# Patient Record
Sex: Female | Born: 1975 | Race: White | Hispanic: No | Marital: Single | Smoking: Former smoker
Health system: Southern US, Community
[De-identification: ages and names within clinical notes are randomized; demographics above are authoritative.]

## PROBLEM LIST (undated history)

## (undated) DIAGNOSIS — K219 Gastro-esophageal reflux disease without esophagitis: Secondary | ICD-10-CM

## (undated) DIAGNOSIS — F32A Depression, unspecified: Secondary | ICD-10-CM

## (undated) DIAGNOSIS — E119 Type 2 diabetes mellitus without complications: Secondary | ICD-10-CM

## (undated) DIAGNOSIS — F419 Anxiety disorder, unspecified: Secondary | ICD-10-CM

## (undated) DIAGNOSIS — G709 Myoneural disorder, unspecified: Secondary | ICD-10-CM

## (undated) DIAGNOSIS — E039 Hypothyroidism, unspecified: Secondary | ICD-10-CM

## (undated) DIAGNOSIS — I10 Essential (primary) hypertension: Secondary | ICD-10-CM

## (undated) DIAGNOSIS — K317 Polyp of stomach and duodenum: Secondary | ICD-10-CM

## (undated) DIAGNOSIS — J45909 Unspecified asthma, uncomplicated: Secondary | ICD-10-CM

## (undated) DIAGNOSIS — F329 Major depressive disorder, single episode, unspecified: Secondary | ICD-10-CM

## (undated) DIAGNOSIS — L0390 Cellulitis, unspecified: Secondary | ICD-10-CM

## (undated) DIAGNOSIS — E669 Obesity, unspecified: Secondary | ICD-10-CM

## (undated) DIAGNOSIS — M1990 Unspecified osteoarthritis, unspecified site: Secondary | ICD-10-CM

## (undated) DIAGNOSIS — A419 Sepsis, unspecified organism: Secondary | ICD-10-CM

## (undated) DIAGNOSIS — G4730 Sleep apnea, unspecified: Secondary | ICD-10-CM

## (undated) DIAGNOSIS — N289 Disorder of kidney and ureter, unspecified: Secondary | ICD-10-CM

## (undated) DIAGNOSIS — K2970 Gastritis, unspecified, without bleeding: Secondary | ICD-10-CM

## (undated) DIAGNOSIS — E785 Hyperlipidemia, unspecified: Secondary | ICD-10-CM

## (undated) HISTORY — DX: Unspecified osteoarthritis, unspecified site: M19.90

## (undated) HISTORY — DX: Polyp of stomach and duodenum: K31.7

## (undated) HISTORY — PX: VAGINAL HYSTERECTOMY: SUR661

## (undated) HISTORY — PX: OTHER SURGICAL HISTORY: SHX169

## (undated) HISTORY — DX: Gastritis, unspecified, without bleeding: K29.70

## (undated) HISTORY — PX: TONSILLECTOMY: SUR1361

## (undated) HISTORY — PX: CHOLECYSTECTOMY: SHX55

## (undated) HISTORY — DX: Gastro-esophageal reflux disease without esophagitis: K21.9

## (undated) HISTORY — DX: Obesity, unspecified: E66.9

## (undated) HISTORY — PX: ADENOIDECTOMY: SUR15

## (undated) HISTORY — PX: URETERAL STENT PLACEMENT: SHX822

## (undated) HISTORY — PX: ANKLE SURGERY: SHX546

## (undated) HISTORY — DX: Myoneural disorder, unspecified: G70.9

## (undated) HISTORY — DX: Sepsis, unspecified organism: A41.9

## (undated) HISTORY — DX: Cellulitis, unspecified: L03.90

## (undated) HISTORY — DX: Hypothyroidism, unspecified: E03.9

## (undated) HISTORY — PX: PICC LINE INSERTION: CATH118290

## (undated) HISTORY — DX: Sleep apnea, unspecified: G47.30

## (undated) HISTORY — PX: FOOT SURGERY: SHX648

---

## 1999-03-06 ENCOUNTER — Encounter: Payer: Self-pay | Admitting: Emergency Medicine

## 1999-03-06 ENCOUNTER — Emergency Department (HOSPITAL_COMMUNITY): Admission: EM | Admit: 1999-03-06 | Discharge: 1999-03-06 | Payer: Self-pay | Admitting: Emergency Medicine

## 1999-07-13 ENCOUNTER — Emergency Department (HOSPITAL_COMMUNITY): Admission: EM | Admit: 1999-07-13 | Discharge: 1999-07-13 | Payer: Self-pay | Admitting: Emergency Medicine

## 1999-07-13 ENCOUNTER — Encounter: Payer: Self-pay | Admitting: Emergency Medicine

## 2002-02-11 HISTORY — PX: COLONOSCOPY: SHX174

## 2003-07-24 ENCOUNTER — Encounter: Payer: Self-pay | Admitting: Emergency Medicine

## 2003-07-24 ENCOUNTER — Emergency Department (HOSPITAL_COMMUNITY): Admission: EM | Admit: 2003-07-24 | Discharge: 2003-07-24 | Payer: Self-pay | Admitting: Emergency Medicine

## 2004-10-15 ENCOUNTER — Inpatient Hospital Stay (HOSPITAL_COMMUNITY): Admission: AD | Admit: 2004-10-15 | Discharge: 2004-10-15 | Payer: Self-pay | Admitting: Family Medicine

## 2007-09-09 ENCOUNTER — Encounter: Payer: Self-pay | Admitting: Infectious Diseases

## 2007-09-10 ENCOUNTER — Other Ambulatory Visit: Admission: RE | Admit: 2007-09-10 | Discharge: 2007-09-10 | Payer: Self-pay | Admitting: *Deleted

## 2007-09-10 ENCOUNTER — Encounter (INDEPENDENT_AMBULATORY_CARE_PROVIDER_SITE_OTHER): Payer: Self-pay | Admitting: *Deleted

## 2008-01-11 ENCOUNTER — Emergency Department (HOSPITAL_COMMUNITY): Admission: EM | Admit: 2008-01-11 | Discharge: 2008-01-11 | Payer: Self-pay | Admitting: Emergency Medicine

## 2008-04-14 ENCOUNTER — Inpatient Hospital Stay (HOSPITAL_COMMUNITY): Admission: RE | Admit: 2008-04-14 | Discharge: 2008-04-16 | Payer: Self-pay | Admitting: Radiology

## 2008-04-14 ENCOUNTER — Ambulatory Visit: Payer: Self-pay | Admitting: *Deleted

## 2008-06-14 ENCOUNTER — Ambulatory Visit: Payer: Self-pay | Admitting: Infectious Diseases

## 2008-06-14 DIAGNOSIS — E8941 Symptomatic postprocedural ovarian failure: Secondary | ICD-10-CM

## 2008-06-14 DIAGNOSIS — G43109 Migraine with aura, not intractable, without status migrainosus: Secondary | ICD-10-CM | POA: Insufficient documentation

## 2008-06-14 DIAGNOSIS — J45909 Unspecified asthma, uncomplicated: Secondary | ICD-10-CM | POA: Insufficient documentation

## 2008-06-14 DIAGNOSIS — Z9049 Acquired absence of other specified parts of digestive tract: Secondary | ICD-10-CM | POA: Insufficient documentation

## 2008-06-14 DIAGNOSIS — R599 Enlarged lymph nodes, unspecified: Secondary | ICD-10-CM | POA: Insufficient documentation

## 2008-06-14 DIAGNOSIS — E0590 Thyrotoxicosis, unspecified without thyrotoxic crisis or storm: Secondary | ICD-10-CM | POA: Insufficient documentation

## 2008-06-14 DIAGNOSIS — F411 Generalized anxiety disorder: Secondary | ICD-10-CM | POA: Insufficient documentation

## 2008-06-14 LAB — CONVERTED CEMR LAB
Angiotensin 1 Converting Enzyme: 28 units/L (ref 9–67)
Hep B Core Total Ab: NEGATIVE
Hepatitis B Surface Ag: NEGATIVE
IgM, Serum: 288 mg/dL — ABNORMAL HIGH (ref 60–263)
Neutrophil cytoplasmic antibodies,IgG,serum: <1:20 {titer}

## 2008-06-17 ENCOUNTER — Encounter: Payer: Self-pay | Admitting: Infectious Diseases

## 2008-06-21 ENCOUNTER — Encounter: Payer: Self-pay | Admitting: Infectious Diseases

## 2008-06-22 ENCOUNTER — Encounter: Payer: Self-pay | Admitting: Infectious Diseases

## 2008-07-15 ENCOUNTER — Ambulatory Visit: Payer: Self-pay | Admitting: Infectious Diseases

## 2008-07-15 DIAGNOSIS — IMO0002 Reserved for concepts with insufficient information to code with codable children: Secondary | ICD-10-CM

## 2008-07-16 ENCOUNTER — Ambulatory Visit (HOSPITAL_COMMUNITY): Admission: RE | Admit: 2008-07-16 | Discharge: 2008-07-16 | Payer: Self-pay | Admitting: Infectious Diseases

## 2008-07-21 ENCOUNTER — Telehealth: Payer: Self-pay | Admitting: Infectious Diseases

## 2009-12-04 ENCOUNTER — Inpatient Hospital Stay (HOSPITAL_COMMUNITY): Admission: EM | Admit: 2009-12-04 | Discharge: 2009-12-05 | Payer: Self-pay | Admitting: Emergency Medicine

## 2009-12-04 ENCOUNTER — Ambulatory Visit: Payer: Self-pay | Admitting: Pulmonary Disease

## 2011-02-04 LAB — D-DIMER, QUANTITATIVE: D-Dimer, Quant: 0.33 ug/mL-FEU (ref 0.00–0.48)

## 2011-02-04 LAB — POCT I-STAT 3, ART BLOOD GAS (G3+)
Acid-Base Excess: 1 mmol/L (ref 0.0–2.0)
Acid-Base Excess: 3 mmol/L — ABNORMAL HIGH (ref 0.0–2.0)
O2 Saturation: 95 %
O2 Saturation: 87 %
TCO2: 34 mmol/L (ref 0–100)
TCO2: 30 mmol/L (ref 0–100)

## 2011-02-04 LAB — COMPREHENSIVE METABOLIC PANEL
ALT: 13 U/L (ref 0–35)
AST: 26 U/L (ref 0–37)
CO2: 26 mEq/L (ref 19–32)
Calcium: 9.1 mg/dL (ref 8.4–10.5)
Calcium: 9.1 mg/dL (ref 8.4–10.5)
Creatinine, Ser: 1.83 mg/dL — ABNORMAL HIGH (ref 0.4–1.2)
GFR calc Af Amer: 38 mL/min — ABNORMAL LOW (ref >60)
GFR calc non Af Amer: 31 mL/min — ABNORMAL LOW (ref >60)
Glucose, Bld: 98 mg/dL (ref 70–99)
Sodium: 137 mEq/L (ref 135–145)
Sodium: 143 mEq/L (ref 135–145)
Total Protein: 8.1 g/dL (ref 6.0–8.3)
Total Protein: 6.5 g/dL (ref 6.0–8.3)

## 2011-02-04 LAB — RAPID URINE DRUG SCREEN, HOSP PERFORMED
Amphetamines: NOT DETECTED
Barbiturates: NOT DETECTED
Benzodiazepines: POSITIVE — AB
Opiates: NOT DETECTED
Tetrahydrocannabinol: NOT DETECTED

## 2011-02-04 LAB — GLUCOSE, CAPILLARY
Glucose-Capillary: 151 mg/dL — ABNORMAL HIGH (ref 70–99)
Glucose-Capillary: 107 mg/dL — ABNORMAL HIGH (ref 70–99)
Glucose-Capillary: 141 mg/dL — ABNORMAL HIGH (ref 70–99)
Glucose-Capillary: 78 mg/dL (ref 70–99)

## 2011-02-04 LAB — CARDIAC PANEL(CRET KIN+CKTOT+MB+TROPI)
CK, MB: 0.8 ng/mL (ref 0.3–4.0)
Relative Index: INVALID (ref 0.0–2.5)
Relative Index: INVALID (ref 0.0–2.5)
Total CK: 55 U/L (ref 7–177)
Total CK: 55 U/L (ref 7–177)
Troponin I: 0.02 ng/mL (ref 0.00–0.06)
Troponin I: 0.02 ng/mL (ref 0.00–0.06)
Troponin I: 0.03 ng/mL (ref 0.00–0.06)

## 2011-02-04 LAB — CBC
Hemoglobin: 12.7 g/dL (ref 12.0–15.0)
MCHC: 33.9 g/dL (ref 30.0–36.0)
MCHC: 34.3 g/dL (ref 30.0–36.0)
MCV: 91.9 fL (ref 78.0–100.0)
Platelets: 218 10*3/uL (ref 150–400)
RBC: 4.88 MIL/uL (ref 3.87–5.11)
RDW: 15.6 % — ABNORMAL HIGH (ref 11.5–15.5)
RDW: 15.8 % — ABNORMAL HIGH (ref 11.5–15.5)

## 2011-02-04 LAB — URINALYSIS, ROUTINE W REFLEX MICROSCOPIC
Bilirubin Urine: NEGATIVE
Hgb urine dipstick: NEGATIVE
Nitrite: NEGATIVE
Protein, ur: NEGATIVE mg/dL
Urobilinogen, UA: 0.2 mg/dL (ref 0.0–1.0)

## 2011-02-04 LAB — DIFFERENTIAL
Eosinophils Relative: 2 % (ref 0–5)
Lymphocytes Relative: 18 % (ref 12–46)
Lymphs Abs: 1.9 10*3/uL (ref 0.7–4.0)
Monocytes Relative: 1 % — ABNORMAL LOW (ref 3–12)

## 2011-02-04 LAB — MAGNESIUM: Magnesium: 2.2 mg/dL (ref 1.5–2.5)

## 2011-04-03 NOTE — H&P (Signed)
Lynn Ponce, Lynn Ponce NO.:  [id number]   MEDICAL RECORD NO.:  [id number]          PATIENT TYPE:  IPS   LOCATION:  0304                          FACILITY:  [facility]   PHYSICIAN:  Jasmine Pang, M.D. DATE OF BIRTH:  July 22, 1976   DATE OF ADMISSION:  04/14/2008  DATE OF DISCHARGE:  04/16/2008                       PSYCHIATRIC ADMISSION ASSESSMENT   This is a 35 year old female voluntarily admitted on Apr 14, 2008.   HISTORY OF PRESENT ILLNESS:  The patient presents with having  hallucinations, experiencing derogatory-type voices, command to hurt  herself.  She states that she had called the crisis center because she  felt that she was wanting to cut herself.  She states her husband  stopped her from any kind of self-harm.  She reports experiencing visual  hallucinations, seeing bad things, feeling depressed and overwhelmed  with recent surgery, taking care of children and being laid off from her  job.  She reports sleeping well with her Ambien CR.  Her appetite has  been satisfactory.  Reporting also problems with energy and feeling  anxious.   PAST PSYCHIATRIC HISTORY:  First admission to [facility]  Sees Dr. Claudie Revering at [facility].  She reports she has been  treated for PTSD, OCD and major depression.   SOCIAL HISTORY:  She is a 35 year old married female.  She lives in  [place], has two children ages 80 and 61.  She is laid off from work  several months ago.  Reports her husband is supportive but does not  understand her mental illness.   FAMILY HISTORY:  Grandmother with bipolar disorder, son who has ADD and  possible bipolar disorder.   ALCOHOL AND DRUG HISTORY:  The patient smokes.  Denies any alcohol or  drug use.   PRIMARY CARE Briah Nary:  Dr. Bevely Palmer.   MEDICAL PROBLEMS:  Had carpal tunnel surgery on her right foot  approximately 4-6 weeks ago.  Currently has a urinary tract infection.  History of migraine headaches and  hypothyroidism.   MEDICATIONS:  1. Abilify 15 mg t.i.d.  2. Valium 2 mg q.i.d.  3. Clomipramine 150 mg at bedtime.  4. Valium 10 mg t.i.d. with a p.r.n. 10 mg dose.  5. Ambien CR for sleep.  6. Synthroid 25 mg daily.  7. Singulair 10 mg at bedtime  8. Has p.r.n. medications Phenergan, Imitrex and Vicodin.  9. Currently on Keflex 500 mg for treating her urinary tract      infection.   DRUG ALLERGIES:  1. SULFA.  2. E-MYCIN.  3. ZITHROMAX.   PHYSICAL EXAMINATION:  The patient was fully assessed at [facility].  She received Valium 10 mg and Percocet two.  Her temperature  is 97.4, 109 heart rate, 18 respirations, blood pressure is 105/76, 5  feet 6 inches tall, 306 pounds.   LABORATORY DATA:  CBC within normal limits.  Alcohol level less than  0.01.  Glucose of 117.  Urine drug screen is positive for  benzodiazepines, positive for opiates, positive for tricyclics.  Urine  pregnancy test is negative.  Urinalysis shows trace leukocytes.  MENTAL STATUS EXAM:  She is fully alert, cooperative, good eye contact.  Currently resting in bed.  Her speech is clear.  Mood is depressed and  anxious.  The patient's affect is pleasant, cooperative.  Thought  processes are coherent.  No evidence of any psychotic symptoms.  Endorsing auditory hallucinations and past visual hallucinations.  Does  not appear to be actively responding.  Cognitive function intact.  Memory is good.  Judgment and insight are fair.   AXIS I:  Major depressive disorder severe, post traumatic stress  disorder and obsessive-compulsive disorder per history.  AXIS II:  Deferred.  AXIS III:  Migraines, asthma, hypothyroidism, status post carpal tunnel  of the right foot, urinary tract infection.  AXIS IV:  Problems with economic issues, psychosocial problems, medical  problems, possible problems with primary support group with lack of  understanding in regards to the patient's mental illness.  AXIS V:  Current  is 30.   PLANS:  Contract for safety.  Stabilize mood.  Will discontinue Abilify,  initiate Geodon.  Risks and benefits were discussed.  The patient is  agreeable to initiating new medication.  We will contact the husband for  support concerns.  The patient considered a fall risk.  Will clarify  medications.  The patient is to continue to be medication compliant and  follow up with her appointments.   Tentative length of stay is 3-5 days.      Landry Corporal, N.P.      Jasmine Pang, M.D.  Electronically Signed    JO/MEDQ  D:  04/16/2008  T:  04/16/2008  Job:  [id number]

## 2011-04-03 NOTE — Discharge Summary (Signed)
NAMESONTEE, DESENA NO.:  [id number]   MEDICAL RECORD NO.:  [id number]          PATIENT TYPE:  IPS   LOCATION:  0304                          FACILITY:  [facility]   PHYSICIAN:  Jasmine Pang, M.D. DATE OF BIRTH:  1976/05/30   DATE OF ADMISSION:  04/14/2008  DATE OF DISCHARGE:  04/16/2008                               DISCHARGE SUMMARY   IDENTIFYING INFORMATION:  This is a 35 year old married white female  from [place], [place], who was admitted on a voluntary basis on  Apr 14, 2008.   HISTORY OF PRESENT ILLNESS:  The patient has a history of hallucination.  She has been experiencing voices, which were derogatory.  She called the  [facility]ris[facility].  She felt she was ready to cut herself.  Her husband  restrained her and kept her from doing this.  She was having positive  visual hallucinations bad things.  She was feeling depressed and  overwhelmed.  She is having decreased energy and very anxious.   PAST PSYCHIATRIC HISTORY:  The patient goes to [facility].  She is on Abilify 15 mg p.o. t.i.d. and Valium 2 mg p.o. q.i.d.  She sees Dr. Penni Bombard. He is treated for PTSD, OCD, major depression, and  ? bipolar disorder.   FAMILY HISTORY:  Grandmother has bipolar disorder.  Son has ADHD and  possibly bipolar disorder.   ALCOHOL AND DRUG HISTORY:  The patient smokes.  She denies any use of  alcohol  and drugs.   MEDICAL PROBLEMS:  1. Carpal tunnel syndrome in her right foot.  2. UTI.  3. Migraine headaches.   MEDICATIONS:  1. Abilify 15 mg p.o. t.i.d.  2. Valium 2 mg p.o. q.i.d.  3. Clomipramine.  4. Ambien CR.   DRUG ALLERGIES:  1. E-Mycin.  2. Ibuprofen.  3. Sulfa drugs.  4. Zithromax.   PHYSICAL FINDINGS:  There are no acute physical or medical problems.  Physical exam was done in the [facility] and reviewed  here.   DIAGNOSTIC STUDIES:  CBC was within normal limits.  Alcohol level was  less than 0.1.  Glucose  117.  UDS positive for benzodiazepines, opiates,  and tricyclics.  Urine pregnancy test was negative.  Urinalysis revealed  trace leukocytes.   HOSPITAL COURSE:  Upon admission, the patient was restarted on her  Abilify 15 mg p.o. t.i.d., clonazepam 0.5 mg p.o. t.i.d., clomipramine  150 mg p.o. q.h.s., Singulair 10 mg p.o. q.h.s., furosemide 60 mg p.o.  q.day, Valium 10 mg p.o. q.day, and Phenergan 25 mg p.o. q.6 h. p.r.n.  nausea.  She was also continued on cephalexin 500 mg p.o. q.i.d. x5  days. The patient was also restarted on her Cymbalta 30 mg p.o. q.day,  Ambien CR 12.5 mg p.o. q.h.s., Synthroid 25 mcg daily, Percocet 5/325  one p.o. q.6 h. p.r.n. pain (from recent surgery on her right ankle),  and furosemide 60 mg daily p.r.n. swelling.  The Cymbalta was  discontinued.  Klonopin was discontinued. Abilify was discontinued.  She  stated she was started on Geodon 40 mg  p.o. every 5 p.m. with food.  The  patient tolerated these medications well with no significant side  effects.  In individual sessions with me, the patient was friendly and  cooperative.  She also participated appropriately in unit therapeutic  groups and activities.  Therapeutic issues revolved around her hearing  voices telling her to kill herself.  She denied any drug or alcohol  abuse.  She states one stressor is that she has been laid off from work.  On Apr 16, 2008, mental status had improved markedly from admission  status.  Her sleep was good and appetite was good.  Mood was euthymic.  Affect wide range.  There was no suicidal or homicidal ideation.  No  thoughts of self-injurious behavior.  No auditory or visual  hallucinations.  No paranoia or delusions.  Thoughts were logical and  goal-directed.  Thought content, excited to see her children when she  gets home and husband.  Cognitive was grossly intact.  It was felt, the  patient was safe for discharge today.  She wanted to go home and she had  a family  member who would pick her up.  She stated her husband wanted  her home as well.   DISCHARGE DIAGNOSES:  Axis I:  Mood disorder not otherwise specified.  Posttraumatic stress disorder and obsessive-compulsive disorder.  Axis II:  None.  Axis III:  Migraines, asthma, hypothyroidism, status post carpal tunnel  surgery of right foot.  Axis IV:  Moderate (economic problems, other psychosocial problems,  medical problems, and burden of psychiatric illness).  Axis V:  Global assessment of functioning was 50 on discharge.  GAF was  30 upon admission.  GAF highest past year was 65.   POSTHOSPITAL CARE PLANS:  There was no specific activity level or  dietary restrictions.   The patient is to go to [facility] in [place] on June 1st at 9:00 a.m.  She is to go to [facility] for counseling.   DISCHARGE MEDICATIONS:  1. Geodon 80 mg at supper.  2. Clomipramine 150 mg at supper.  3. Singulair 10 mg at bedtime.  4. Furosemide 60 mg daily as needed.  5. Synthroid 25 mcg daily.  6. Valium 10 mg three times a day.  7. Resume Phenergan, Imitrex pain pills, and Ambien CR as per district      by her doctor.  8. Keflex 500 mg four times a day until finished on April 19, 2008.      Jasmine Pang, M.D.  Electronically Signed    BHS/MEDQ  D:  04/16/2008  T:  04/16/2008  Job:  [id number]

## 2013-04-08 HISTORY — PX: ESOPHAGOGASTRODUODENOSCOPY: SHX1529

## 2013-12-13 ENCOUNTER — Emergency Department (HOSPITAL_COMMUNITY): Payer: Medicare HMO

## 2013-12-13 ENCOUNTER — Encounter (HOSPITAL_COMMUNITY): Payer: Self-pay | Admitting: Emergency Medicine

## 2013-12-13 ENCOUNTER — Emergency Department (HOSPITAL_COMMUNITY)
Admission: EM | Admit: 2013-12-13 | Discharge: 2013-12-13 | Disposition: A | Discharge status: Home / Self Care | Payer: Medicare HMO | Attending: Emergency Medicine | Admitting: Emergency Medicine

## 2013-12-13 DIAGNOSIS — J45909 Unspecified asthma, uncomplicated: Secondary | ICD-10-CM | POA: Insufficient documentation

## 2013-12-13 DIAGNOSIS — E119 Type 2 diabetes mellitus without complications: Secondary | ICD-10-CM | POA: Insufficient documentation

## 2013-12-13 DIAGNOSIS — Y9389 Activity, other specified: Secondary | ICD-10-CM | POA: Insufficient documentation

## 2013-12-13 DIAGNOSIS — W01119A Fall on same level from slipping, tripping and stumbling with subsequent striking against unspecified sharp object, initial encounter: Secondary | ICD-10-CM | POA: Insufficient documentation

## 2013-12-13 DIAGNOSIS — S81009A Unspecified open wound, unspecified knee, initial encounter: Secondary | ICD-10-CM | POA: Insufficient documentation

## 2013-12-13 DIAGNOSIS — W261XXA Contact with sword or dagger, initial encounter: Secondary | ICD-10-CM

## 2013-12-13 DIAGNOSIS — Z8659 Personal history of other mental and behavioral disorders: Secondary | ICD-10-CM | POA: Insufficient documentation

## 2013-12-13 DIAGNOSIS — Y92009 Unspecified place in unspecified non-institutional (private) residence as the place of occurrence of the external cause: Secondary | ICD-10-CM | POA: Insufficient documentation

## 2013-12-13 DIAGNOSIS — S91009A Unspecified open wound, unspecified ankle, initial encounter: Principal | ICD-10-CM

## 2013-12-13 DIAGNOSIS — S81809A Unspecified open wound, unspecified lower leg, initial encounter: Principal | ICD-10-CM

## 2013-12-13 DIAGNOSIS — S81022A Laceration with foreign body, left knee, initial encounter: Secondary | ICD-10-CM

## 2013-12-13 DIAGNOSIS — W260XXA Contact with knife, initial encounter: Secondary | ICD-10-CM | POA: Insufficient documentation

## 2013-12-13 DIAGNOSIS — I10 Essential (primary) hypertension: Secondary | ICD-10-CM | POA: Insufficient documentation

## 2013-12-13 DIAGNOSIS — Z23 Encounter for immunization: Secondary | ICD-10-CM | POA: Insufficient documentation

## 2013-12-13 HISTORY — DX: Essential (primary) hypertension: I10

## 2013-12-13 HISTORY — DX: Major depressive disorder, single episode, unspecified: F32.9

## 2013-12-13 HISTORY — DX: Type 2 diabetes mellitus without complications: E11.9

## 2013-12-13 HISTORY — DX: Depression, unspecified: F32.A

## 2013-12-13 HISTORY — DX: Hyperlipidemia, unspecified: E78.5

## 2013-12-13 HISTORY — DX: Unspecified asthma, uncomplicated: J45.909

## 2013-12-13 LAB — BASIC METABOLIC PANEL
BUN: 19 mg/dL (ref 6–23)
CHLORIDE: 98 meq/L (ref 96–112)
CO2: 23 meq/L (ref 19–32)
Calcium: 9.7 mg/dL (ref 8.4–10.5)
Creatinine, Ser: 1.39 mg/dL — ABNORMAL HIGH (ref 0.50–1.10)
GFR calc Af Amer: 54 mL/min — ABNORMAL LOW (ref >90)
GFR calc non Af Amer: 47 mL/min — ABNORMAL LOW (ref >90)
GLUCOSE: 147 mg/dL — AB (ref 70–99)
POTASSIUM: 5 meq/L (ref 3.7–5.3)
SODIUM: 138 meq/L (ref 137–147)

## 2013-12-13 LAB — CBC WITH DIFFERENTIAL/PLATELET
BASOS ABS: 0 10*3/uL (ref 0.0–0.1)
Basophils Relative: 0 % (ref 0–1)
Eosinophils Absolute: 0.3 10*3/uL (ref 0.0–0.7)
Eosinophils Relative: 3 % (ref 0–5)
HCT: 40.9 % (ref 36.0–46.0)
Hemoglobin: 14 g/dL (ref 12.0–15.0)
LYMPHS ABS: 4.1 10*3/uL — AB (ref 0.7–4.0)
LYMPHS PCT: 38 % (ref 12–46)
MCH: 29.6 pg (ref 26.0–34.0)
MCHC: 34.2 g/dL (ref 30.0–36.0)
MCV: 86.5 fL (ref 78.0–100.0)
Monocytes Absolute: 0.6 10*3/uL (ref 0.1–1.0)
Monocytes Relative: 6 % (ref 3–12)
NEUTROS ABS: 5.8 10*3/uL (ref 1.7–7.7)
NEUTROS PCT: 54 % (ref 43–77)
PLATELETS: 252 10*3/uL (ref 150–400)
RBC: 4.73 MIL/uL (ref 3.87–5.11)
RDW: 15.6 % — AB (ref 11.5–15.5)
WBC: 10.8 10*3/uL — AB (ref 4.0–10.5)

## 2013-12-13 MED ORDER — CEPHALEXIN 500 MG PO CAPS: 500.0000 mg | ORAL_CAPSULE | Freq: Four times a day (QID) | ORAL | Status: DC

## 2013-12-13 MED ORDER — MORPHINE SULFATE 4 MG/ML IJ SOLN
4.0000 mg | Freq: Once | INTRAMUSCULAR | Status: AC
  Administered 2013-12-13: 4 mg via INTRAVENOUS
  Filled 2013-12-13: qty 1

## 2013-12-13 MED ORDER — SODIUM CHLORIDE 0.9 % IV SOLN
Freq: Once | INTRAVENOUS | Status: AC
  Administered 2013-12-13: 01:00:00 via INTRAVENOUS

## 2013-12-13 MED ORDER — TETANUS-DIPHTH-ACELL PERTUSSIS 5-2.5-18.5 LF-MCG/0.5 IM SUSP
0.5000 mL | Freq: Once | INTRAMUSCULAR | Status: AC
  Administered 2013-12-13: 0.5 mL via INTRAMUSCULAR
  Filled 2013-12-13: qty 0.5

## 2013-12-13 MED ORDER — HYDROCODONE-ACETAMINOPHEN 5-325 MG PO TABS: 2.0000 | ORAL_TABLET | ORAL | Status: DC | PRN

## 2013-12-13 MED ORDER — ONDANSETRON HCL 4 MG/2ML IJ SOLN
4.0000 mg | Freq: Once | INTRAMUSCULAR | Status: AC
  Administered 2013-12-13: 4 mg via INTRAVENOUS
  Filled 2013-12-13: qty 2

## 2013-12-13 MED ORDER — CEFAZOLIN SODIUM 1-5 GM-% IV SOLN
1.0000 g | Freq: Once | INTRAVENOUS | Status: AC
  Administered 2013-12-13: 1 g via INTRAVENOUS
  Filled 2013-12-13: qty 50

## 2013-12-13 NOTE — ED Notes (Signed)
Patient transported to X-ray 

## 2013-12-13 NOTE — ED Notes (Signed)
Pt taken to [facility] in wheel chair. Pt was able to stand and pivot bearing weight on non affected leg. Pt denies feeling drowsy or lightheaded from the Morphine.

## 2013-12-13 NOTE — ED Notes (Signed)
Pt back from xray at this time.

## 2013-12-13 NOTE — ED Provider Notes (Signed)
CSN: [id number]     Arrival date & time 12/13/13  0016 History   First MD Initiated Contact with Patient 12/13/13 0023     Chief Complaint  Patient presents with  . Puncture Wound   (Consider location/radiation/quality/duration/timing/severity/associated sxs/prior Treatment) HPI Comments: Patient is a 38 year old female with history of morbid obesity, hypertension, diabetes. She is transported to the ER by EMS after slipping and falling at home while doing the dishes. She apparently landed on a knife and was brought here with a large kitchen knife embedded in her left knee. She reports pain with any movement it is relieved with sitting still. She denies any other injury in the fall  The history is provided by the patient.    Past Medical History  Diagnosis Date  . Hypertension   . Diabetes mellitus without complication   . Hyperlipemia   . Asthma   . Depression    History reviewed. No pertinent past surgical history. No family history on file. History  Substance Use Topics  . Smoking status: Not on file  . Smokeless tobacco: Not on file  . Alcohol Use: Not on file   OB History   Grav Para Term Preterm Abortions TAB SAB Ect Mult Living                 Review of Systems  All other systems reviewed and are negative.    Allergies  Ibuprofen; Sulfonamide derivatives; and Toradol  Home Medications  No current outpatient prescriptions on file. BP 106/54  Pulse 98  Temp(Src) 98.3 F (36.8 C) (Oral)  Resp 20  SpO2 93% Physical Exam  Nursing note and vitals reviewed. Constitutional: She is oriented to person, place, and time. She appears well-developed and well-nourished. No distress.  HENT:  Head: Normocephalic and atraumatic.  Mouth/Throat: Oropharynx is clear and moist.  Neck: Normal range of motion. Neck supple.  Abdominal: Soft. Bowel sounds are normal.  Musculoskeletal: Normal range of motion. She exhibits no edema.  There is a kitchen knife embedded several  inches into the fatty tissue of the lateral aspect of the left knee. The distal dorsalis pedis and posterior tibial pulses are easily palpable. Sensation and motor are intact to the left foot as well.  Neurological: She is alert and oriented to person, place, and time.  Skin: Skin is warm and dry. She is not diaphoretic.    ED Course  Procedures (including critical care time) Labs Review Labs Reviewed  CBC WITH DIFFERENTIAL  BASIC METABOLIC PANEL   Imaging Review No results found.  LACERATION REPAIR Performed by: Veryl Speak Authorized by: Veryl Speak Consent: Verbal consent obtained. Risks and benefits: risks, benefits and alternatives were discussed Consent given by: patient Patient identity confirmed: provided demographic data Prepped and Draped in normal sterile fashion Wound explored  Laceration Location: Left leg  Laceration Length: 5 cm cm  No Foreign Bodies seen or palpated  Anesthesia: local infiltration  Local anesthetic: lidocaine 2 % without epinephrine  Anesthetic total: 5 ml  Irrigation method: syringe Amount of cleaning: standard  Skin closure: 4-0 Prolene   Number of sutures: 7   Technique: Simple   Patient tolerance: Patient tolerated the procedure well with no immediate complications.   MDM  No diagnosis found. Patient was brought here by EMS after slipping and falling in the kitchen and landing on a knife. She had a kitchen knife embedded in the soft tissues of the lateral aspect of the left knee. X-rays do not indicate penetration of  the knee joint or bony abnormality. I removed the knife and explored the wound. I saw nothing that made me believe there was a traumatic penetration of the knee joint. The wound was heavily irrigated and closed as per the above procedure note. She tolerated this procedure well. Her tetanus was updated she was given IV Kefzol. She will be discharged with local wound care, pain medication, Keflex, and suture  removal in 10-14 days.    Veryl Speak, MD 12/13/13 [phone]

## 2013-12-13 NOTE — ED Notes (Signed)
Per EMS: Pt report she was doing dishes at home alone when she slipped on some water. Pt was holding a large steak knife that she claimed accidentally stabbed her in the leg during the fall. Knife is still in place in her L thigh. Bleeding controlled at this time. Knife is supported by guaze and brace. V/S 130/pal, 94 BPM, Pain 10/10.

## 2013-12-13 NOTE — Discharge Instructions (Signed)
Keflex as prescribed. Hydrocodone as prescribed as needed for pain.  Local wound care: Apply bacitracin and a clean dressing twice daily.  Return to the emergency department if you notice redness surrounding the wound, pus from the wound, streaks up and down the leg, or other concerning findings.  Sutures to be removed in the next 10-14 days.   Laceration Care, Adult A laceration is a cut or lesion that goes through all layers of the skin and into the tissue just beneath the skin. TREATMENT  Some lacerations may not require closure. Some lacerations may not be able to be closed due to an increased risk of infection. It is important to see your caregiver as soon as possible after an injury to minimize the risk of infection and maximize the opportunity for successful closure. If closure is appropriate, pain medicines may be given, if needed. The wound will be cleaned to help prevent infection. Your caregiver will use stitches (sutures), staples, wound glue (adhesive), or skin adhesive strips to repair the laceration. These tools bring the skin edges together to allow for faster healing and a better cosmetic outcome. However, all wounds will heal with a scar. Once the wound has healed, scarring can be minimized by covering the wound with sunscreen during the day for 1 full year. HOME CARE INSTRUCTIONS  For sutures or staples:  Keep the wound clean and dry.  If you were given a bandage (dressing), you should change it at least once a day. Also, change the dressing if it becomes wet or dirty, or as directed by your caregiver.  Wash the wound with soap and water 2 times a day. Rinse the wound off with water to remove all soap. Pat the wound dry with a clean towel.  After cleaning, apply a thin layer of the antibiotic ointment as recommended by your caregiver. This will help prevent infection and keep the dressing from sticking.  You may shower as usual after the first 24 hours. Do not soak the  wound in water until the sutures are removed.  Only take over-the-counter or prescription medicines for pain, discomfort, or fever as directed by your caregiver.  Get your sutures or staples removed as directed by your caregiver. For skin adhesive strips:  Keep the wound clean and dry.  Do not get the skin adhesive strips wet. You may bathe carefully, using caution to keep the wound dry.  If the wound gets wet, pat it dry with a clean towel.  Skin adhesive strips will fall off on their own. You may trim the strips as the wound heals. Do not remove skin adhesive strips that are still stuck to the wound. They will fall off in time. For wound adhesive:  You may briefly wet your wound in the shower or bath. Do not soak or scrub the wound. Do not swim. Avoid periods of heavy perspiration until the skin adhesive has fallen off on its own. After showering or bathing, gently pat the wound dry with a clean towel.  Do not apply liquid medicine, cream medicine, or ointment medicine to your wound while the skin adhesive is in place. This may loosen the film before your wound is healed.  If a dressing is placed over the wound, be careful not to apply tape directly over the skin adhesive. This may cause the adhesive to be pulled off before the wound is healed.  Avoid prolonged exposure to sunlight or tanning lamps while the skin adhesive is in place. Exposure to ultraviolet  light in the first year will darken the scar.  The skin adhesive will usually remain in place for 5 to 10 days, then naturally fall off the skin. Do not pick at the adhesive film. You may need a tetanus shot if:  You cannot remember when you had your last tetanus shot.  You have never had a tetanus shot. If you get a tetanus shot, your arm may swell, get red, and feel warm to the touch. This is common and not a problem. If you need a tetanus shot and you choose not to have one, there is a rare chance of getting tetanus. Sickness  from tetanus can be serious. SEEK MEDICAL CARE IF:   You have redness, swelling, or increasing pain in the wound.  You see a red line that goes away from the wound.  You have yellowish-white fluid (pus) coming from the wound.  You have a fever.  You notice a bad smell coming from the wound or dressing.  Your wound breaks open before or after sutures have been removed.  You notice something coming out of the wound such as wood or glass.  Your wound is on your hand or foot and you cannot move a finger or toe. SEEK IMMEDIATE MEDICAL CARE IF:   Your pain is not controlled with prescribed medicine.  You have severe swelling around the wound causing pain and numbness or a change in color in your arm, hand, leg, or foot.  Your wound splits open and starts bleeding.  You have worsening numbness, weakness, or loss of function of any joint around or beyond the wound.  You develop painful lumps near the wound or on the skin anywhere on your body. MAKE SURE YOU:   Understand these instructions.  Will watch your condition.  Will get help right away if you are not doing well or get worse. Document Released: 11/05/2005 Document Revised: 01/28/2012 Document Reviewed: 05/01/2011 [facility] Patient Information 2014 [place], [place].

## 2013-12-16 ENCOUNTER — Encounter (HOSPITAL_COMMUNITY): Payer: Self-pay | Admitting: Emergency Medicine

## 2013-12-16 ENCOUNTER — Emergency Department (HOSPITAL_COMMUNITY)
Admission: EM | Admit: 2013-12-16 | Discharge: 2013-12-16 | Disposition: A | Discharge status: Home / Self Care | Payer: Medicare HMO | Attending: Emergency Medicine | Admitting: Emergency Medicine

## 2013-12-16 DIAGNOSIS — J45909 Unspecified asthma, uncomplicated: Secondary | ICD-10-CM | POA: Insufficient documentation

## 2013-12-16 DIAGNOSIS — R51 Headache: Secondary | ICD-10-CM

## 2013-12-16 DIAGNOSIS — Y838 Other surgical procedures as the cause of abnormal reaction of the patient, or of later complication, without mention of misadventure at the time of the procedure: Secondary | ICD-10-CM | POA: Insufficient documentation

## 2013-12-16 DIAGNOSIS — R519 Headache, unspecified: Secondary | ICD-10-CM

## 2013-12-16 DIAGNOSIS — Z79899 Other long term (current) drug therapy: Secondary | ICD-10-CM | POA: Insufficient documentation

## 2013-12-16 DIAGNOSIS — G43909 Migraine, unspecified, not intractable, without status migrainosus: Secondary | ICD-10-CM | POA: Insufficient documentation

## 2013-12-16 DIAGNOSIS — T8131XA Disruption of external operation (surgical) wound, not elsewhere classified, initial encounter: Secondary | ICD-10-CM | POA: Insufficient documentation

## 2013-12-16 DIAGNOSIS — T8130XA Disruption of wound, unspecified, initial encounter: Secondary | ICD-10-CM

## 2013-12-16 DIAGNOSIS — E785 Hyperlipidemia, unspecified: Secondary | ICD-10-CM | POA: Insufficient documentation

## 2013-12-16 DIAGNOSIS — Z794 Long term (current) use of insulin: Secondary | ICD-10-CM | POA: Insufficient documentation

## 2013-12-16 DIAGNOSIS — F329 Major depressive disorder, single episode, unspecified: Secondary | ICD-10-CM | POA: Insufficient documentation

## 2013-12-16 DIAGNOSIS — I10 Essential (primary) hypertension: Secondary | ICD-10-CM | POA: Insufficient documentation

## 2013-12-16 DIAGNOSIS — F411 Generalized anxiety disorder: Secondary | ICD-10-CM | POA: Insufficient documentation

## 2013-12-16 DIAGNOSIS — F3289 Other specified depressive episodes: Secondary | ICD-10-CM | POA: Insufficient documentation

## 2013-12-16 DIAGNOSIS — R209 Unspecified disturbances of skin sensation: Secondary | ICD-10-CM | POA: Insufficient documentation

## 2013-12-16 DIAGNOSIS — Z792 Long term (current) use of antibiotics: Secondary | ICD-10-CM | POA: Insufficient documentation

## 2013-12-16 DIAGNOSIS — E119 Type 2 diabetes mellitus without complications: Secondary | ICD-10-CM | POA: Insufficient documentation

## 2013-12-16 HISTORY — DX: Anxiety disorder, unspecified: F41.9

## 2013-12-16 MED ORDER — METOCLOPRAMIDE HCL 5 MG/ML IJ SOLN
10.0000 mg | Freq: Once | INTRAMUSCULAR | Status: AC
  Administered 2013-12-16: 10 mg via INTRAMUSCULAR
  Filled 2013-12-16: qty 2

## 2013-12-16 MED ORDER — DIPHENHYDRAMINE HCL 50 MG/ML IJ SOLN
25.0000 mg | Freq: Once | INTRAMUSCULAR | Status: AC
  Administered 2013-12-16: 25 mg via INTRAMUSCULAR
  Filled 2013-12-16: qty 1

## 2013-12-16 MED ORDER — OXYCODONE-ACETAMINOPHEN 5-325 MG PO TABS: 1.0000 | ORAL_TABLET | Freq: Four times a day (QID) | ORAL | Status: DC | PRN

## 2013-12-16 NOTE — ED Notes (Signed)
Pt states she was seen in ED Saturday for laceration to L leg.  States all of the sutures came out yesterday except for 2.  Pt reports she went to [facility] ED yesterday and had steri-strips put on but they have come off.

## 2013-12-16 NOTE — Discharge Instructions (Signed)
Please read and follow all provided instructions.  Your diagnoses today include:  1. Dehiscence of laceration wound   2. Headache     Tests performed today include:  Vital signs. See below for your results today.   Medications prescribed:   Percocet (oxycodone/acetaminophen) - narcotic pain medication  DO NOT drive or perform any activities that require you to be awake and alert because this medicine can make you drowsy. BE VERY CAREFUL not to take multiple medicines containing Tylenol (also called acetaminophen). Doing so can lead to an overdose which can damage your liver and cause liver failure and possibly death.  Take any prescribed medications only as directed.   Home care instructions:  Follow any educational materials and wound care instructions contained in this packet.   Keep affected area above the level of your heart when possible to minimize swelling. Wash area gently twice a day with warm soapy water. Do not apply alcohol or hydrogen peroxide. Cover the area if it draining or weeping.   Follow-up instructions: Suture Removal: Return to your primary care care doctor in 5 days for a recheck of your wound and possible removal of your sutures or staples.    If you do not have a primary care doctor -- see below for referral information.   Return instructions:  Return to the Emergency Department if you have:  Fever  Worsening pain  Worsening swelling of the wound  Pus draining from the wound  Redness of the skin that moves away from the wound, especially if it streaks away from the affected area   Any other emergent concerns Return if you have weakness in your arms or legs, slurred speech, trouble walking or talking, confusion, or trouble with your balance.   Your vital signs today were: BP 111/78   Pulse 93   Temp(Src) 97.8 F (36.6 C) (Oral)   Resp 20   SpO2 96% If your blood pressure (BP) was elevated above 135/85 this visit, please have this repeated by  your doctor within one month. --------------

## 2013-12-16 NOTE — ED Notes (Signed)
Wound dressed with bacitracin then petroleum guaze, then gauze wrap, then ace wrap. No bleeding noted. RN educated pt on how to dress wound. Pt instructed to adjust tightness of ace wrap per her comfort.

## 2013-12-16 NOTE — ED Provider Notes (Signed)
CSN: [id number]     Arrival date & time 12/16/13  1628 History   This chart was scribed for non-physician practitioner Carlisle Cater, PA-C, working with Richarda Blade, MD by Adriana Reams, ED Scribe. This patient was seen in room TR10C/TR10C and the patient's care was started at 1709.   First MD Initiated Contact with Patient 12/16/13 1709     Chief Complaint  Patient presents with  . Wound Check    The history is provided by the patient. No language interpreter was used.   HPI Comments: Lynn Ponce is a 38 y.o. female with hx of DM who presents to the Emergency Department complaining of left leg pain due to a laceration which occurred 4 nights ago when she fell and a butcher knife went into her leg. She had the wound sutured, but she tore the stitches last night when she was standing up from a sitting position. She went to [facility] ED yesterday and had steri-strips put on, but they have come off. She is currently complaining of pain to the wound described as burning. She is currently taking cephalexin and 5-325 hydrocodone, but denies relief of her pain. She also complains of mild tingling in her feet which is not baseline for her DM.   She also complains of a gradual onset, gradually worsening, moderate migraine that is similar to her previous chronic migraines. No weakness in arms or legs. This is not main complaint. She has been seen in ED in past and treated with dilaudid.    Past Medical History  Diagnosis Date  . Hypertension   . Diabetes mellitus without complication   . Hyperlipemia   . Asthma   . Depression   . Anxiety    History reviewed. No pertinent past surgical history. No family history on file. History  Substance Use Topics  . Smoking status: Never Smoker   . Smokeless tobacco: Not on file  . Alcohol Use: No   OB History   Grav Para Term Preterm Abortions TAB SAB Ect Mult Living                 Review of Systems  Constitutional: Positive for activity  change. Negative for fever.  HENT: Negative for congestion, dental problem, rhinorrhea and sinus pressure.   Eyes: Negative for photophobia, discharge, redness and visual disturbance.  Respiratory: Negative for shortness of breath.   Cardiovascular: Negative for chest pain.  Gastrointestinal: Negative for nausea and vomiting.  Musculoskeletal: Positive for arthralgias. Negative for back pain, gait problem, joint swelling, neck pain and neck stiffness.  Skin: Positive for wound. Negative for rash.  Neurological: Positive for headaches. Negative for syncope, speech difficulty, weakness, light-headedness and numbness.  Psychiatric/Behavioral: Negative for confusion.    Allergies  Sulfonamide derivatives; Erythromycin; Ibuprofen; Mushroom extract complex; and Toradol  Home Medications   Current Outpatient Rx  Name  Route  Sig  Dispense  Refill  . ALPRAZolam (XANAX) 1 MG tablet   Oral   Take 1 mg by mouth 2 (two) times daily as needed for anxiety.         Marland Kitchen amitriptyline (ELAVIL) 150 MG tablet   Oral   Take 150 mg by mouth at bedtime.         Marland Kitchen atenolol (TENORMIN) 25 MG tablet   Oral   Take 25 mg by mouth daily.         Marland Kitchen buPROPion (WELLBUTRIN XL) 300 MG 24 hr tablet   Oral   Take 300  mg by mouth daily.         . cephALEXin (KEFLEX) 500 MG capsule   Oral   Take 1 capsule (500 mg total) by mouth 4 (four) times daily.   28 capsule   0   . HYDROcodone-acetaminophen (NORCO) 5-325 MG per tablet   Oral   Take 2 tablets by mouth every 4 (four) hours as needed.   15 tablet   0   . HYDROcodone-acetaminophen (NORCO/VICODIN) 5-325 MG per tablet   Oral   Take 1-2 tablets by mouth every 6 (six) hours as needed for moderate pain or severe pain.         Marland Kitchen insulin glargine (LANTUS) 100 UNIT/ML injection   Subcutaneous   Inject 54 Units into the skin at bedtime.         Marland Kitchen levothyroxine (SYNTHROID, LEVOTHROID) 175 MCG tablet   Oral   Take 175 mcg by mouth daily before  breakfast. Total dose is 340mg.         .Marland Kitchenlevothyroxine (SYNTHROID, LEVOTHROID) 200 MCG tablet   Oral   Take 200 mcg by mouth daily before breakfast. Total dose is 3765m         . lisinopril (PRINIVIL,ZESTRIL) 20 MG tablet   Oral   Take 20 mg by mouth daily.         . metFORMIN (GLUCOPHAGE) 1000 MG tablet   Oral   Take 1,000 mg by mouth daily with breakfast.         . montelukast (SINGULAIR) 10 MG tablet   Oral   Take 10 mg by mouth at bedtime.         . Marland KitchenARoxetine (PAXIL) 30 MG tablet   Oral   Take 30 mg by mouth 2 (two) times daily.         . phentermine 37.5 MG capsule   Oral   Take 37.5 mg by mouth daily.         . traZODone (DESYREL) 150 MG tablet   Oral   Take 150 mg by mouth at bedtime. Scheduled for sleep         . ziprasidone (GEODON) 60 MG capsule   Oral   Take 60 mg by mouth 2 (two) times daily with a meal.         . ziprasidone (GEODON) 80 MG capsule   Oral   Take 80 mg by mouth 2 (two) times daily with a meal.          BP 111/78  Pulse 93  Temp(Src) 97.8 F (36.6 C) (Oral)  Resp 20  SpO2 96%  Physical Exam  Nursing note and vitals reviewed. Constitutional: She is oriented to person, place, and time. She appears well-developed and well-nourished. No distress.  HENT:  Head: Normocephalic and atraumatic.  Right Ear: Tympanic membrane, external ear and ear canal normal.  Left Ear: Tympanic membrane, external ear and ear canal normal.  Nose: Nose normal.  Mouth/Throat: Uvula is midline, oropharynx is clear and moist and mucous membranes are normal.  Eyes: Conjunctivae, EOM and lids are normal. Pupils are equal, round, and reactive to light. Right eye exhibits no nystagmus. Left eye exhibits no nystagmus.  Neck: Normal range of motion. Neck supple. No tracheal deviation present.  Cardiovascular: Normal rate and regular rhythm.   Pulmonary/Chest: Effort normal and breath sounds normal. No respiratory distress.  Abdominal: Soft.  There is no tenderness.  Musculoskeletal: Normal range of motion.       Cervical back: She exhibits  normal range of motion, no tenderness and no bony tenderness.  Neurological: She is alert and oriented to person, place, and time. She has normal strength and normal reflexes. No cranial nerve deficit or sensory deficit. She displays a negative Romberg sign. Coordination and gait normal. GCS eye subscore is 4. GCS verbal subscore is 5. GCS motor subscore is 6.  Skin: Skin is warm and dry. Laceration noted.  5 cm open laceration to left lateral calf. 2 proline stitches in place at either end. No redness, drainage, swelling or other signs of cellulitis.   Psychiatric: She has a normal mood and affect. Her behavior is normal.    ED Course  Procedures (including critical care time) DIAGNOSTIC STUDIES: Oxygen Saturation is 96% on RA, adequate by my interpretation.    COORDINATION OF CARE: 5:44 PM Discussed treatment plan which includes Reglan and Toradol for her migraine with pt at bedside and pt agreed to plan. Will clean and dress the wound and apply an ACE bandage. Discussed importance of keeping wound clean. Return precautions advised.   Labs Review Labs Reviewed - No data to display Imaging Review No results found.  EKG Interpretation   None      Patient seen and examined.    Vital signs reviewed and are as follows: Filed Vitals:   12/16/13 1704  BP: 111/78  Pulse: 93  Temp: 97.8 F (36.6 C)  Resp: 20   Patient counseled on wound care. Patient counseled on need to return or see PCP/urgent care for suture removal in 5 days. Patient was urged to return to the Emergency Department urgently with worsening pain, swelling, expanding erythema especially if it streaks away from the affected area, fever, or if they have any other concerns. She will continue take antibiotic. Patient verbalized understanding.   Patient counseled on use of narcotic pain medications. Counseled not to combine  these medications with others containing tylenol. Urged not to drink alcohol, drive, or perform any other activities that requires focus while taking these medications. The patient verbalizes understanding and agrees with the plan.  She will receive reglan/toradol for HA.   MDM   1. Dehiscence of laceration wound   2. Headache    Wound dehiscence: patient with diabetes, on keflex, will not close wound due to concern for infection. No current infection. Patient counseled on wound care.   HA: no gross neuro deficits. Same as previous HA. Treated with reglan/benadryl.   I personally performed the services described in this documentation, which was scribed in my presence. The recorded information has been reviewed and is accurate.     Carlisle Cater, PA-C 12/16/13 1843

## 2013-12-17 NOTE — ED Provider Notes (Signed)
Medical screening examination/treatment/procedure(s) were performed by non-physician practitioner and as supervising physician I was immediately available for consultation/collaboration.  Richarda Blade, MD 12/17/13 [phone]

## 2015-11-08 ENCOUNTER — Ambulatory Visit (INDEPENDENT_AMBULATORY_CARE_PROVIDER_SITE_OTHER): Payer: Medicare HMO | Admitting: Neurology

## 2015-11-08 ENCOUNTER — Encounter: Payer: Self-pay | Admitting: Neurology

## 2015-11-08 ENCOUNTER — Encounter: Payer: Self-pay | Admitting: *Deleted

## 2015-11-08 VITALS — BP 135/87 | HR 95 | Ht 68.0 in | Wt 262.0 lb

## 2015-11-08 DIAGNOSIS — R569 Unspecified convulsions: Secondary | ICD-10-CM | POA: Diagnosis not present

## 2015-11-08 DIAGNOSIS — R404 Transient alteration of awareness: Secondary | ICD-10-CM | POA: Insufficient documentation

## 2015-11-08 DIAGNOSIS — G43909 Migraine, unspecified, not intractable, without status migrainosus: Secondary | ICD-10-CM | POA: Insufficient documentation

## 2015-11-08 MED ORDER — TOPIRAMATE 50 MG PO TABS: 50.0000 mg | ORAL_TABLET | Freq: Two times a day (BID) | ORAL | Status: DC

## 2015-11-08 NOTE — Patient Instructions (Addendum)
Remember to drink plenty of fluid, eat healthy meals and do not skip any meals. Try to eat protein with a every meal and eat a healthy snack such as fruit or nuts in between meals. Try to keep a regular sleep-wake schedule and try to exercise daily, particularly in the form of walking, 20-30 minutes a day, if you can.   As far as your medications are concerned, I would like to suggest: Increase Topamax  As far as diagnostic testing: eeg, cmp  I would like to see you back in 4 months, sooner if we need to. Please call us with any interim questions, concerns, problems, updates or refill requests.   Please also call us for any test results so we can go over those with you on the phone.  My clinical assistant and will answer any of your questions and relay your messages to me and also relay most of my messages to you.   Our phone number is [phone]. We also have an after hours call service for urgent matters and there is a physician on-call for urgent questions. For any emergencies you know to call 911 or go to the nearest emergency room

## 2015-11-08 NOTE — Progress Notes (Addendum)
[id number]    Provider:  Dr Jaynee Eagles Referring Provider: Angelina Sheriff, MD Primary Care Physician:  Antonietta Jewel, MD  CC:  Seizure and migraines  HPI:  Lynn Ponce is a 39 y.o. female here as a referral from Dr. Lin Landsman for seizure. She has a PMHx of morbid obesity, HTN, diabetes, HTN, depression with a suicide attempt, diabetic neuropathy, tobacco abuse, chronic pain medications. She was in [place] and she was driving down the road and the next thing she knew she was hearing voices and slowly she was coming back to consciousness and people were around her. She doesn't rememebr anything about the event. The next thing she remembers, her car was on a Ambulance person. She was on her way to get a biscuit that morning to eat, in her usual state of health. She had not eaten that morning, possibly her blood sugar was low. However she denies any lightheadedness, dizziness or any inciting events. She had not urinated on herself or bit her tongue. [facility] She was exhausted, felt like she had run a marathon. No illnesses, no fevers or anything out of the ordinary. She had a headache. She left the ED and then was brought back. A lady in the parking lot said she was "convulsing"  In her car. No family history of seizures. Patient had febrile seizures as a child. Several years ago she had some episodes but routine EEG was normal. She would pass out and have shaking episodes. She saw a neurologist in the past Dr. Metta Clines. She is on Neurontin 656m tid. She was just placed on Topamax at misison 228m   Reviewed notes, labs and imaging from outside physicians, which showed: [facility] was unremarkable. UDS was positive for benzo and oxy (she is prescribed both). MRi of the brain and EEG unremarkable. She was diagnosed with seizure, migraine variant, syncope, withdrawal syndrome, hypoglycemic episode, conversion disorder. She had hypoxia.     Ct showed No acute intracranial abnormalities including mass lesion or mass effect, hydrocephalus, extra-axial fluid collection, midline shift, hemorrhage, or acute infarction, large ischemic events (personally reviewed images)   CT head 12/03/2009: showed No acute intracranial abnormalities including mass lesion or mass effect, hydrocephalus, extra-axial fluid collection, midline shift, hemorrhage, or acute infarction, large ischemic events (personally reviewed images)  Review of Systems: Patient complains of symptoms per HPI as well as the following symptoms: Weight gain, fatigue, double vision, shortness of breath, cough, wheezing, snoring, swelling in legs, feeling hot, increased thirst, flushing, ringing in ears, spinning sensation, diarrhea, joint pain, joint swelling, allergies, memory loss, confusion, headache, numbness, weakness, insomnia, snoring, restless legs, dizziness, seizure, passing out, tremor depression, anxiety, not really. Pertinent negatives per HPI. All others negative.   Social History   Social History  . Marital Status: Single    Spouse Name: N/A  . Number of Children: 2  . Years of Education: N/A   Occupational History  . Disabled    Social History Main Topics  . Smoking status: Never Smoker   . Smokeless tobacco: Not on file  . Alcohol Use: No  . Drug Use: No  . Sexual Activity: Not on file   Other Topics Concern  . Not on file   Social History Narrative   Lives with herself   Caffeine use: 2 cup per day (soda/coffee)    Family History  Problem Relation Age of Onset  . Seizures Neg Hx   . Migraines  Neg Hx     Past Medical History  Diagnosis Date  . Hypertension   . Diabetes mellitus without complication ([place])   . Hyperlipemia   . Asthma   . Depression   . Anxiety     Past Surgical History  Procedure Laterality Date  . Vaginal hysterectomy    . Other surgical history      lap for ovaries x6  . Cholecystectomy    . Tonsillectomy     . Adenoidectomy    . Foot surgery      x4  . Ankle surgery      x1    Current Outpatient Prescriptions  Medication Sig Dispense Refill  . ALPRAZolam (XANAX) 1 MG tablet Take 1 mg by mouth 2 (two) times daily as needed for anxiety (or 0.5 2 times daily).     Marland Kitchen amitriptyline (ELAVIL) 50 MG tablet Take 100 mg by mouth daily.     . Blood Glucose Monitoring Suppl (ONE TOUCH ULTRA MINI) W/DEVICE KIT     . buPROPion (WELLBUTRIN SR) 200 MG 12 hr tablet Take 200 mg by mouth 2 (two) times daily.     Marland Kitchen dicyclomine (BENTYL) 10 MG capsule Take 10 mg by mouth 4 (four) times daily -  before meals and at bedtime.    . gabapentin (NEURONTIN) 600 MG tablet Take 600 mg by mouth 4 (four) times daily.     Marland Kitchen LATUDA 40 MG TABS tablet Take 40 mg by mouth daily with breakfast.     . LITETOUCH INSULIN SYRINGE 31G X 5/16" 1 ML MISC     . metFORMIN (GLUCOPHAGE) 1000 MG tablet Take 1,000 mg by mouth daily with breakfast.    . montelukast (SINGULAIR) 10 MG tablet Take 10 mg by mouth at bedtime.    Marland Kitchen NOVOLOG MIX 70/30 (70-30) 100 UNIT/ML injection Inject 25 Units into the skin 2 (two) times daily with a meal.     . ONE TOUCH ULTRA TEST test strip     . ONETOUCH DELICA LANCETS 63J MISC     . oxyCODONE-acetaminophen (PERCOCET) 10-325 MG tablet Take 1 tablet by mouth 3 (three) times daily.     . promethazine (PHENERGAN) 25 MG tablet Take 25 mg by mouth every 6 (six) hours as needed for nausea or vomiting.    . SUMAtriptan (IMITREX NA) Place 1 spray into the nose as needed.    . SUMAtriptan (IMITREX) 6 MG/0.5ML SOLN injection Inject 6 mg into the skin every 2 (two) hours as needed for migraine or headache. May repeat in 2 hours if headache persists or recurs.    Marland Kitchen SYNTHROID 125 MCG tablet Take 125 mcg by mouth daily before breakfast. 2 tablets per day    . VENTOLIN HFA 108 (90 BASE) MCG/ACT inhaler     . topiramate (TOPAMAX) 50 MG tablet Take 1 tablet (50 mg total) by mouth 2 (two) times daily. 60 tablet 11   No  current facility-administered medications for this visit.    Allergies as of 11/08/2015 - Review Complete 12/16/2013  Allergen Reaction Noted  . Sulfonamide derivatives Anaphylaxis 06/14/2008  . Erythromycin Hives 12/13/2013  . Ibuprofen Other (See Comments) 06/14/2008  . Mushroom extract complex Hives 12/13/2013  . Toradol [ketorolac tromethamine] Other (See Comments) 12/13/2013    Vitals: BP 135/87 mmHg  Pulse 95  Ht 5' 8"  (1.727 m)  Wt 262 lb (118.842 kg)  BMI 39.85 kg/m2 Last Weight:  Wt Readings from Last 1 Encounters:  11/08/15 262 lb (497.026  kg)   Last Height:   Ht Readings from Last 1 Encounters:  11/08/15 5' 8"  (1.727 m)   Physical exam: Exam: Gen: NAD, conversant, well nourised, obese, well groomed                     CV: RRR, no MRG. No Carotid Bruits. No peripheral edema, warm, nontender Eyes: Conjunctivae clear without exudates or hemorrhage  Neuro: Detailed Neurologic Exam  Speech:    Speech is normal; fluent and spontaneous with normal comprehension.  Cognition:    The patient is oriented to person, place, and time;     recent and remote memory intact;     language fluent;     normal attention, concentration,     fund of knowledge Cranial Nerves:    The pupils are equal, round, and reactive to light. The fundi are flat. Visual fields are full to finger confrontation. Extraocular movements are intact. Trigeminal sensation is intact and the muscles of mastication are normal. The face is symmetric. The palate elevates in the midline. Hearing intact. Voice is normal. Shoulder shrug is normal. The tongue has normal motion without fasciculations.   Coordination:    Normal finger to nose and heel to shin. Normal rapid alternating movements.   Gait:    Heel-toe and tandem gait are normal.   Motor Observation:    No asymmetry, no atrophy, and no involuntary movements noted. Tone:    Normal muscle tone.    Posture:    Posture is normal. normal erect     Strength:    Strength is V/V in the upper and lower limbs.      Sensation: intact to LT     Reflex Exam:  DTR's: Hyporeflexic lowers, deep tendon reflexes in the upper extremities are normal bilaterally.   Toes:    The toes are downgoing bilaterally.   Clonus:    Clonus is absent.       Assessment/Plan:  39 year old female here as a referral from Dr. Lin Landsman for seizure. She has a PMHx of morbid obesity, HTN, diabetes, HTN, depression with a suicide attempt, diabetic neuropathy, tobacco abuse, multiple pain medications. She was admitted to [facility] and she was diagnosed with seizure, migraine variant, syncope, withdrawal syndrome, hypoglycemic episode, conversion disorder. She also had hypoxia.   Seizure-like event: Patient is already on Neurontin 600 mg 4 times a day, benzodiazepines daily. She was started on Topamax at [facility] for her migraines but was not started on any additional medications for seizure prevention. Will increase her Topamax to 50 mg twice a day. Continue Neurontin. Will request a 72 hour prolonged video EEG ambulatory. CMP today. Migraines: increase topamax Ringing in the ears: decrease salt Patient is unable to drive, operate heavy machinery, perform activities at heights or participate in water activities until 6 months seizure free  Addendum: 72 hour video ambulatory eeg negative for epileptiform activity. 3 push-button events. See scanned report.  Sarina Ill, MD  [facility] [address] [place], [place] [address]  Phone [phone] Fax [phone]

## 2015-11-09 ENCOUNTER — Encounter: Payer: Self-pay | Admitting: *Deleted

## 2015-11-09 ENCOUNTER — Telehealth: Payer: Self-pay | Admitting: *Deleted

## 2015-11-09 LAB — COMPREHENSIVE METABOLIC PANEL
ALK PHOS: 101 IU/L (ref 39–117)
ALT: 14 IU/L (ref 0–32)
AST: 14 IU/L (ref 0–40)
Albumin/Globulin Ratio: 1.5 (ref 1.1–2.5)
Albumin: 4.4 g/dL (ref 3.5–5.5)
BUN/Creatinine Ratio: 13 (ref 9–23)
BUN: 16 mg/dL (ref 6–24)
Bilirubin Total: 0.2 mg/dL (ref 0.0–1.2)
CO2: 20 mmol/L (ref 18–29)
CREATININE: 1.22 mg/dL — AB (ref 0.57–1.00)
Calcium: 9.3 mg/dL (ref 8.7–10.2)
Chloride: 104 mmol/L (ref 96–106)
GFR calc Af Amer: 64 mL/min/{1.73_m2} (ref >59)
GFR calc non Af Amer: 56 mL/min/{1.73_m2} — ABNORMAL LOW (ref >59)
GLOBULIN, TOTAL: 3 g/dL (ref 1.5–4.5)
Glucose: 88 mg/dL (ref 65–99)
POTASSIUM: 5 mmol/L (ref 3.5–5.2)
SODIUM: 145 mmol/L — AB (ref 134–144)
Total Protein: 7.4 g/dL (ref 6.0–8.5)

## 2015-11-09 NOTE — Progress Notes (Signed)
Faxed order for 72-hr AMB EEG to be scheduled through [facility]. Received confirmation. Sent copy to medical records.

## 2015-11-09 NOTE — Telephone Encounter (Signed)
-----   Message from Melvenia Beam, MD sent at 11/09/2015  7:37 AM EST ----- Labs unremarkable. Her creatinine is slightly elevated but this is chronic it is improved from previous values over the last 5 years.

## 2015-11-09 NOTE — Telephone Encounter (Signed)
LVM for pt to call about results. Gave [facility] phone number and hours.

## 2015-11-10 NOTE — Telephone Encounter (Signed)
Spoke to pt about labs per Dr Jaynee Eagles. Pt verbalized understanding.

## 2015-11-10 NOTE — Telephone Encounter (Signed)
Patient is returning a call.

## 2015-12-12 NOTE — Progress Notes (Signed)
Pt scheduled for 72-hr AMB EEG 12/19/15-12/22/15. Copy sent to MR.

## 2015-12-27 NOTE — Progress Notes (Signed)
Referral notification from [facility]: Study has been completed and once loaded into their system, they will send additional notification to our office within 48 hr to let us know it has been scanned and report ready for Dr Junius Argyle to review and interpret.

## 2016-01-15 ENCOUNTER — Telehealth: Payer: Self-pay | Admitting: Neurology

## 2016-01-15 NOTE — Telephone Encounter (Signed)
Let patient know her 3-day EEG was normal, thanks

## 2016-01-16 NOTE — Telephone Encounter (Signed)
Called and spoke to pt about normal 3 day EEG per Dr Jaynee Eagles. Pt verbalized understanding.

## 2016-02-02 ENCOUNTER — Telehealth: Payer: Self-pay | Admitting: *Deleted

## 2016-02-02 NOTE — Telephone Encounter (Signed)
Patient DMV form on [employer].

## 2016-02-03 DIAGNOSIS — Z0289 Encounter for other administrative examinations: Secondary | ICD-10-CM

## 2016-02-13 ENCOUNTER — Telehealth: Payer: Self-pay | Admitting: *Deleted

## 2016-02-13 NOTE — Telephone Encounter (Signed)
Patient DMV mailed to her home address request by patient.

## 2016-03-08 ENCOUNTER — Encounter: Payer: Self-pay | Admitting: Neurology

## 2016-03-08 ENCOUNTER — Ambulatory Visit (INDEPENDENT_AMBULATORY_CARE_PROVIDER_SITE_OTHER): Payer: Medicare HMO | Admitting: Neurology

## 2016-03-08 VITALS — BP 110/70 | HR 92 | Wt 283.4 lb

## 2016-03-08 DIAGNOSIS — G43709 Chronic migraine without aura, not intractable, without status migrainosus: Secondary | ICD-10-CM | POA: Insufficient documentation

## 2016-03-08 MED ORDER — PROMETHAZINE HCL 25 MG PO TABS
25.0000 mg | ORAL_TABLET | Freq: Four times a day (QID) | ORAL | Status: DC | PRN
Start: 2016-03-08 — End: 2017-03-12

## 2016-03-08 MED ORDER — TOPIRAMATE 50 MG PO TABS: ORAL_TABLET | ORAL | Status: DC

## 2016-03-08 MED ORDER — GABAPENTIN 600 MG PO TABS: 600.0000 mg | ORAL_TABLET | Freq: Four times a day (QID) | ORAL | Status: DC

## 2016-03-08 MED ORDER — ELETRIPTAN HYDROBROMIDE 40 MG PO TABS: 40.0000 mg | ORAL_TABLET | ORAL | Status: DC | PRN

## 2016-03-08 NOTE — Patient Instructions (Signed)
Remember to drink plenty of fluid, eat healthy meals and do not skip any meals. Try to eat protein with a every meal and eat a healthy snack such as fruit or nuts in between meals. Try to keep a regular sleep-wake schedule and try to exercise daily, particularly in the form of walking, 20-30 minutes a day, if you can.   As far as your medications are concerned, I would like to suggest: increase Topamax as discussed. At onset of migraine take tylenol, phenergan and relpax and caffeine. May repeat Relpax in 2 hours.   I would like to see you back in 6 months, sooner if we need to. Please call us with any interim questions, concerns, problems, updates or refill requests.   Our phone number is [phone]. We also have an after hours call service for urgent matters and there is a physician on-call for urgent questions. For any emergencies you know to call 911 or go to the nearest emergency room  To prevent or relieve headaches, try the following: Cool Compress. Lie down and place a cool compress on your head.  Avoid headache triggers. If certain foods or odors seem to have triggered your migraines in the past, avoid them. A headache diary might help you identify triggers.  Include physical activity in your daily routine. Try a daily walk or other moderate aerobic exercise.  Manage stress. Find healthy ways to cope with the stressors, such as delegating tasks on your to-do list.  Practice relaxation techniques. Try deep breathing, yoga, massage and visualization.  Eat regularly. Eating regularly scheduled meals and maintaining a healthy diet might help prevent headaches. Also, drink plenty of fluids.  Follow a regular sleep schedule. Sleep deprivation might contribute to headaches Consider biofeedback. With this mind-body technique, you learn to control certain bodily functions - such as muscle tension, heart rate and blood pressure - to prevent headaches or reduce headache pain.    Proceed to  emergency room if you experience new or worsening symptoms or symptoms do not resolve, if you have new neurologic symptoms or if headache is severe, or for any concerning symptom.
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
## 2016-03-12 ENCOUNTER — Telehealth: Payer: Self-pay | Admitting: *Deleted

## 2016-03-12 NOTE — Telephone Encounter (Addendum)
Called pt. Advised relpax that Dr Jaynee Eagles prescribed last week requires a PA. Advised that medications listed on her formulary must be tried/failed first. She stated she has tried maxalt and has SE (nausea, was ineffective). She cannot remember when she took this. States "That was years ago". Advised doing a PA on relpax does not guarantee it will be covered. If denied, she will have to pay out of pocket for medication. She states "I cannot afford to pay out of pocket". Advised we may have to go back to trying something on her formulary list. I will keep her updated on PA status. She verbalized understanding.

## 2016-03-12 NOTE — Telephone Encounter (Signed)
Submitted PA request for relpax VIA covermymeds to pt insurance. Awaiting reply.

## 2016-03-14 NOTE — Telephone Encounter (Signed)
Received notice of approval of medicare prescription drug coverage from [facility] for relpax. Effective date: 11/18/2015-11/18/2016. Referral number: [id number].

## 2016-03-19 ENCOUNTER — Encounter: Payer: Self-pay | Admitting: Neurology

## 2016-03-19 ENCOUNTER — Telehealth: Payer: Self-pay | Admitting: Neurology

## 2016-03-19 ENCOUNTER — Ambulatory Visit (INDEPENDENT_AMBULATORY_CARE_PROVIDER_SITE_OTHER): Payer: Medicare HMO | Admitting: Neurology

## 2016-03-19 VITALS — BP 95/78 | HR 85 | Temp 97.0°F | Ht 68.0 in | Wt 275.4 lb

## 2016-03-19 DIAGNOSIS — G500 Trigeminal neuralgia: Secondary | ICD-10-CM | POA: Diagnosis not present

## 2016-03-19 DIAGNOSIS — G43011 Migraine without aura, intractable, with status migrainosus: Secondary | ICD-10-CM | POA: Diagnosis not present

## 2016-03-19 DIAGNOSIS — M542 Cervicalgia: Secondary | ICD-10-CM

## 2016-03-19 DIAGNOSIS — R51 Headache: Secondary | ICD-10-CM

## 2016-03-19 DIAGNOSIS — R519 Headache, unspecified: Secondary | ICD-10-CM

## 2016-03-19 MED ORDER — DIHYDROERGOTAMINE MESYLATE 1 MG/ML IJ SOLN
0.5000 mg | Freq: Once | INTRAMUSCULAR | Status: AC
  Administered 2016-03-19: 0.5 mg via INTRAVENOUS

## 2016-03-19 MED ORDER — KETOROLAC TROMETHAMINE 60 MG/2ML IM SOLN
60.0000 mg | Freq: Once | INTRAMUSCULAR | Status: AC
  Administered 2016-03-19: 60 mg via INTRAMUSCULAR

## 2016-03-19 NOTE — Progress Notes (Signed)
Gave DHE 0.40m in left deltoid. Cleaned with alcohol wipe prior to injection. Pt tolerated well. Vitals stable prior to injection.

## 2016-03-19 NOTE — Progress Notes (Signed)
Gave Toradol 61m/ml in right deltoid. Cleaned with alcohol wipe prior to injection. Pt tolerated well. Applied band aid.

## 2016-03-19 NOTE — Telephone Encounter (Signed)
Called pt back. She stated the relpax was not covered by her insurance. She has tried to take her Imitrex, phenergan and tylenol, but this does not help. She has also tried to take oxycodone that she has for her pain in her feet. She last took this at 2am this morning. Advised her to drink lots of water and stay hydrated. Offered appt at 1030am today, check in 1015am. She was agreeable to this. She will have someone bring her to appt.

## 2016-03-19 NOTE — Telephone Encounter (Signed)
Called pt [place] to verify if Relpax was approved or denied. Pt in office today stating relpax denied. I spoke to [place]. She stated they have on file that Rx relpax approved effective 11/18/15-11/18/16. Pt should be able to get medication with no problem from her pharmacy per Felicity Pellegrini.

## 2016-03-19 NOTE — Telephone Encounter (Signed)
Pt called about a migraine she has had since Saturday. She was told to call office if this happened. Please call [phone]

## 2016-03-19 NOTE — Progress Notes (Signed)
Nerve Block: Lidocaine 2% Lot: [id number] Expiration 09/2019 NDC: 67737-366-81  Marcaine 0.5% Lot: [id number] Expiration: 06/19/2017 NDC: 5947-0761-51

## 2016-03-19 NOTE — Progress Notes (Addendum)
    NERVE BLOCK PROCEDURE NOTE  History:   Procedure: Patient was consented for bilateral occipital, bilateral cervical, bilateral supraorbital and trochlear and bilateral trigeminal nerve blocks. A solution containing ingredients below was prepared in 4 3-CC syringes with 30 gauge 1/2 inch needle.   26 Target areas were identified via palpation and pain response.The sites junctions were sterilized with alcohol wipes. The contents of each syringe was injected in a fanlike fashion. The headache improved from 100/10 to 4/10. Patient tolerated the procedure well and no complications were noted.    Consent was provided below and patient acknowledged understanding:   Nerve Block: 59m Lidocaine 2%  Lot: [id number] Expiration 09/2019  NDC: 694503-888-28  Marcaine 0.5% 641mLot: [id number]  Expiration: 06/19/2017  NDC: 040034-9179-15 What to expect afterwards?  Immediately after the injection, the back of your head may feel warm and numb. You may also experience reduction in the pain. The local anaesthetic wears off in a few hours and the steroid usually takes  3-7 days to take effect.   The pain relief is vary variable and can last from a few days to several months. Some patients do not experience any pain relief. Hence it is difficult to predict the outcome of the injection treatment in a particular patient.   There may be some discomfort at the injection site for a couple of days after treatment, however, this should settle quite quickly. We advise you to take things easy for the rest of the day. Continue taking your pain medication as advised by your consultant or until you feel benefit from the treatment.   What are the side effects / complications?  Common   Soreness / bruising at the injection site.   Temporary increase (up to 7 days) in pain following procedure.   Rare   Bleeding   Infection at the injection site   Allergic reaction   New pain   Worsening pain

## 2016-07-13 ENCOUNTER — Telehealth: Payer: Self-pay | Admitting: Neurology

## 2016-07-13 NOTE — Telephone Encounter (Signed)
Patient called, states she woke up with headache this morning, on a scale of 1-10, rates as a 10. Please advise what to do? States has taken promethazine (PHENERGAN) 25 MG tablet, oxyCODONE-acetaminophen (PERCOCET) 10-325 MG tablet, eletriptan (RELPAX) 40 MG tablet and Excedrin Migraine with Caffeine. States she has taken 2 doses of eletriptan (RELPAX) 40 MG tablet, asks if she can take another one.

## 2016-07-13 NOTE — Telephone Encounter (Signed)
Spoke to pt and she woke up with migraine.  Has taken all the medications as per below.  Has helped slightly.  Level to 9.  She is in a cool, dark enviroment.  Pain is located both sides of head and crown of head.  I told her that the relpax is to be taken only 2 tabs in a 24hour period.  She verbalized understanding.   I told her that we close at 1200 and if migraines worsens or does not get relief then she would need to go to ED or urgent care for eval and treatment.  She verbalized understanding.

## 2016-09-11 ENCOUNTER — Ambulatory Visit (INDEPENDENT_AMBULATORY_CARE_PROVIDER_SITE_OTHER): Payer: Medicare HMO | Admitting: Adult Health

## 2016-09-11 ENCOUNTER — Encounter: Payer: Self-pay | Admitting: Adult Health

## 2016-09-11 VITALS — BP 100/82 | HR 92 | Ht 68.0 in | Wt 290.8 lb

## 2016-09-11 DIAGNOSIS — G43009 Migraine without aura, not intractable, without status migrainosus: Secondary | ICD-10-CM | POA: Diagnosis not present

## 2016-09-11 DIAGNOSIS — R569 Unspecified convulsions: Secondary | ICD-10-CM

## 2016-09-11 MED ORDER — TOPIRAMATE 50 MG PO TABS: ORAL_TABLET | ORAL | 3 refills | Status: DC

## 2016-09-11 NOTE — Progress Notes (Signed)
PATIENT: Lynn Ponce DOB: 1976-10-09  REASON FOR VISIT: follow up- migraine HISTORY FROM: patient  HISTORY OF PRESENT ILLNESS: Today 09/11/2016: Lynn Ponce is a 40 year old female with a history of migraine headaches. She returns today for follow-up. She is currently on Topamax taking 49m  in the morning and 100 mg in evening. She uses Relpax Tylenol and Phenergan for acute therapy for migraines. She states that her headaches have been under good control. She states that she has approximately 6 headaches a month. She states this has significantly decreased from what it was. She reports that her headaches typically occur on the right side above the eye. She does confirm photophobia and phonophobia. She does have nausea but typically no vomiting. For acute therapy she continues to get good benefit with Relpax, Tylenol and Phenergan. In the past she's had an ambulatory EEG that was normal. She denies any additional seizure-type events. She remains on gabapentin 4 times a day. She reports that she will be having dental surgery this Thursday. She returns today for an evaluation.  Interval history 03/08/2016: 72-hour EEG was normal. She pressed the button 3 times with her dizziness. No more episodes. She is not having any side effects to the Topamax. She has only had a few migraines since being seen. The imitrex nasal spray helps. She had one migraine for 3 daysbut she was sick with a boil at the time. She gets a migraine once a month which is resistant. She gets nause and vomiting and takes phenerrgan which helps. Recommend taking relpax, tylenol and phenergan at the onset of migraine. Can repeat Relpax if needed in 2 hours. No side effects from the topamax.  She has 5-6 a month maybe.   Acute management: she has tried imitrex (nasal spray, injections and PO). She cannot take ibuprofen. Tried Reglan in the past.   PHYSICAN CONCLUSION/IMPRESSION:  This was a normal prolonged ambulatory 72-hour (69  hours completed) video EEG.  No epileptiform discharges seen. No electrographic or electroclinical events present. There was no focal or background slowing seen.  There were 3 push button events. None of these correlated with an abnormality.  Owing to this prolonged VEEG being entirely normal (i.e. no interictal epileptiform discharges seen and no changes on the EEG seen with the 3 push events), other, non-epileptic causes should be considered for this patient's symptoms (e.g. convulsive syncope).   HPI: Lynn PETROVis a 40y.o. female here as a referral from Dr. RLin Landsmanfor seizure. She has a PMHx of morbid obesity, HTN, diabetes, HTN, depression with a suicide attempt, diabetic neuropathy, tobacco abuse, chronic pain medications[place] and the next thing she knew she was hearing voices and slowly she was coming back to consciousness and people were around her. She doesn't rememebr anything about the event. The next thing she remembers, her car was on a fAmbulance person She was on her way to get a biscuit that morning to eat, in her usual state of health. She had not eaten that morning, possibly her blood sugar was low. However she denies any lightheadedness, dizziness or any inciting events. She had not urinated on herself or bit her tongue. [facility] She was exhausted, felt like she had run a marathon. No illnesses, no fevers or anything out of the ordinary. She had a headache. She left the ED and then was brought back. A lady in the parking lot  said she was "convulsing" In her car. No family history of seizures. Patient had febrile seizures as a child. Several years ago she had some episodes but routine EEG was normal. She would pass out and have shaking episodes. She saw a neurologist in the past Dr. Metta Clines. She is on Neurontin 674m tid. She was just placed on Topamax at misison 268m   Reviewed notes, labs and  imaging from outside physicians, which showed[facility]able. UDS was positive for benzo and oxy (she is prescribed both). MRi of the brain and EEG unremarkable. She was diagnosed with seizure, migraine variant, syncope, withdrawal syndrome, hypoglycemic episode, conversion disorder. She had hypoxia.   Ct showed No acute intracranial abnormalities including mass lesion or mass effect, hydrocephalus, extra-axial fluid collection, midline shift, hemorrhage, or acute infarction, large ischemic events (personally reviewed images)   CT head 12/03/2009: showed No acute intracranial abnormalities including mass lesion or mass effect, hydrocephalus, extra-axial fluid collection, midline shift, hemorrhage, or acute infarction, large ischemic events (personally reviewed images)   REVIEW OF SYSTEMS: Out of a complete 14 system review of symptoms, the patient complains only of the following symptoms, and all other reviewed systems are negative.  Joint pain, joint swelling, aching muscles, walking difficulty, rash, depression, headache, difficulty urinating, frequent infections, excessive eating, nausea, restless leg, daytime sleepiness, sleep talking, chest pain, leg swelling, light sensitivity, eye discharge, fatigue, fever, ringing in ears, trouble swallowing  ALLERGIES: Allergies  Allergen Reactions  . Sulfonamide Derivatives Anaphylaxis  . Erythromycin Hives  . Ibuprofen Other (See Comments)    Dark, tarry emesis and stool. Per pt "vomiting blood"  . Mushroom Extract Complex Hives  . Toradol [Ketorolac Tromethamine] Other (See Comments)    Dark tarry emesis and stool Per pt "vomiting blood"  . Azithromycin Rash  . Shellfish-Derived Products Rash    HOME MEDICATIONS: Outpatient Medications Prior to Visit  Medication Sig Dispense Refill  . amitriptyline (ELAVIL) 50 MG tablet Take 100 mg by mouth daily.     . Blood Glucose Monitoring Suppl (ONE TOUCH  ULTRA MINI) W/DEVICE KIT     . dicyclomine (BENTYL) 10 MG capsule Take 10 mg by mouth 4 (four) times daily -  before meals and at bedtime.    . Marland Kitchenletriptan (RELPAX) 40 MG tablet Take 1 tablet (40 mg total) by mouth as needed for migraine or headache. May repeat in 2 hours if headache persists or recurs. 10 tablet 12  . furosemide (LASIX) 20 MG tablet Take 20 mg by mouth daily.    . Marland Kitchenabapentin (NEURONTIN) 600 MG tablet Take 1 tablet (600 mg total) by mouth 4 (four) times daily. 120 tablet 12  . LATUDA 40 MG TABS tablet Take 40 mg by mouth daily with breakfast.     . LITETOUCH INSULIN SYRINGE 31G X 5/16" 1 ML MISC     . metFORMIN (GLUCOPHAGE) 1000 MG tablet Take 1,000 mg by mouth daily with breakfast.    . montelukast (SINGULAIR) 10 MG tablet Take 10 mg by mouth at bedtime.    . Marland KitchenOVOLOG MIX 70/30 (70-30) 100 UNIT/ML injection Inject 25 Units into the skin 2 (two) times daily with a meal.     . ONE TOUCH ULTRA TEST test strip     . ONETOUCH DELICA LANCETS 3333AISC     . oxyCODONE-acetaminophen (PERCOCET) 10-325 MG tablet Take 1 tablet by mouth 3 (three) times daily.     . promethazine (PHENERGAN) 25 MG tablet Take 1  tablet (25 mg total) by mouth every 6 (six) hours as needed for nausea or vomiting. 30 tablet 12  . topiramate (TOPAMAX) 50 MG tablet Take 2m in the morning and 1072min the evening. 90 tablet 11  . VENTOLIN HFA 108 (90 BASE) MCG/ACT inhaler     . ALPRAZolam (XANAX) 1 MG tablet Take 1 mg by mouth 2 (two) times daily as needed for anxiety (or 0.5 2 times daily).     . Marland KitchenLUoxetine (PROZAC) 10 MG capsule Take 10 mg by mouth daily.    . Marland KitchenYNTHROID 125 MCG tablet Take 125 mcg by mouth daily before breakfast. 2 tablets per day     No facility-administered medications prior to visit.     PAST MEDICAL HISTORY: Past Medical History:  Diagnosis Date  . Anxiety   . Asthma   . Depression   . Diabetes mellitus without complication ([place]  . Hyperlipemia   . Hypertension     PAST SURGICAL  HISTORY: Past Surgical History:  Procedure Laterality Date  . ADENOIDECTOMY    . ANKLE SURGERY     x1  . CHOLECYSTECTOMY    . FOOT SURGERY     x4  . OTHER SURGICAL HISTORY     lap for ovaries x6  . TONSILLECTOMY    . VAGINAL HYSTERECTOMY      FAMILY HISTORY: Family History  Problem Relation Age of Onset  . Seizures Neg Hx   . Migraines Neg Hx     SOCIAL HISTORY: Social History   Social History  . Marital status: Single    Spouse name: N/A  . Number of children: 2  . Years of education: N/A   Occupational History  . Disabled    Social History Main Topics  . Smoking status: Current Every Day Smoker    Packs/day: 1.00  . Smokeless tobacco: Never Used  . Alcohol use No  . Drug use: No  . Sexual activity: Not on file   Other Topics Concern  . Not on file   Social History Narrative   Lives with herself   Caffeine use: 2 cup per day (soda/coffee)      PHYSICAL EXAM  Vitals:   09/11/16 1002  BP: 100/82  Pulse: 92  Weight: 290 lb 12.8 oz (131.9 kg)  Height: _0  (1.727 m)   Body mass index is 44.22 kg/m.  Generalized: Well developed, in no acute distress   Neurological examination  Mentation: Alert oriented to time, place, history taking. Follows all commands speech and language fluent Cranial nerve II-XII: Pupils were equal round reactive to light. Extraocular movements were full, visual field were full on confrontational test. Facial sensation and strength were normal. Uvula tongue midline. Head turning and shoulder shrug  were normal and symmetric. Motor: The motor testing reveals 5 over 5 strength of all 4 extremities. Good symmetric motor tone is noted throughout.  Sensory: Sensory testing is intact to soft touch on all 4 extremities. No evidence of extinction is noted.  Coordination: Cerebellar testing reveals good finger-nose-finger and heel-to-shin bilaterally.  Gait and station: Gait is normal. Tandem gait is normal. Romberg is negative. No  drift is seen.  Reflexes: Deep tendon reflexes are symmetric and normal bilaterally.   DIAGNOSTIC DATA (LABS, IMAGING, TESTING) - I reviewed patient records, labs, notes, testing and imaging myself where available.      Component Value Date/Time   NA 145 (H) 11/08/2015 0924   K 5.0 11/08/2015 0924   CL 104 11/08/2015  0924   CO2 20 11/08/2015 0924   GLUCOSE 88 11/08/2015 0924   GLUCOSE 147 (H) 12/13/2013 0040   BUN 16 11/08/2015 0924   CREATININE 1.22 (H) 11/08/2015 0924   CALCIUM 9.3 11/08/2015 0924   PROT 7.4 11/08/2015 0924   ALBUMIN 4.4 11/08/2015 0924   AST 14 11/08/2015 0924   ALT 14 11/08/2015 0924   ALKPHOS 101 11/08/2015 0924   BILITOT 0.2 11/08/2015 0924   GFRNONAA 56 (L) 11/08/2015 0924   GFRAA 64 11/08/2015 0924         ASSESSMENT AND PLAN 40 y.o. year old female  has a past medical history of Anxiety; Asthma; Depression; Diabetes mellitus without complication ([facility]); Hyperlipemia; and Hypertension. here with:  1. Migraine headaches 2. Convulsions  The patient's headaches have been under relatively good control. She is currently happy with her medication regimen. She will continue on Topamax 50 mg in the morning and 100 mg in the evening. She'll continue using Relpax, Tylenol and Phenergan for acute treatment. Advised patient that if her symptoms worsen or she develops any new symptoms she should let us know. She will follow-up in 6 months with Dr. Jaynee Eagles or sooner if needed.   Ward Givens, MSN, NP-C 09/11/2016, 10:35 AM [facility] [address], [place], [place] [address] [phone]

## 2016-09-11 NOTE — Patient Instructions (Signed)
Continue Topamax  Use Relpax, tylenol and phenergan for treatment of headache If your symptoms worsen or you develop new symptoms please let us know.

## 2016-09-20 NOTE — Progress Notes (Signed)
Personally have participated in and made any corrections needed to history, physical, neuro exam,assessment and plan as stated above.    Sarina Ill, MD [facility]

## 2017-02-27 ENCOUNTER — Telehealth: Payer: Self-pay | Admitting: Rheumatology

## 2017-02-27 NOTE — Telephone Encounter (Signed)
Referral has not been received yet.

## 2017-02-27 NOTE — Telephone Encounter (Signed)
Patient called this morning.  She is not a patient of Dr. Estanislado Pandy yet, but her doctor sent a referral.  She is wanting to know if we have received it and if she can get in soon because she is in a lot of pain.  CB#[phone].  Thank you.

## 2017-03-12 ENCOUNTER — Ambulatory Visit (INDEPENDENT_AMBULATORY_CARE_PROVIDER_SITE_OTHER): Payer: Medicare HMO | Admitting: Adult Health

## 2017-03-12 ENCOUNTER — Encounter (INDEPENDENT_AMBULATORY_CARE_PROVIDER_SITE_OTHER): Payer: Self-pay

## 2017-03-12 ENCOUNTER — Encounter: Payer: Self-pay | Admitting: Adult Health

## 2017-03-12 VITALS — BP 105/76 | HR 64 | Resp 20 | Ht 68.0 in | Wt 307.0 lb

## 2017-03-12 DIAGNOSIS — G43009 Migraine without aura, not intractable, without status migrainosus: Secondary | ICD-10-CM | POA: Diagnosis not present

## 2017-03-12 MED ORDER — PROMETHAZINE HCL 25 MG PO TABS: 25.0000 mg | ORAL_TABLET | Freq: Four times a day (QID) | ORAL | 12 refills | Status: DC | PRN

## 2017-03-12 MED ORDER — ELETRIPTAN HYDROBROMIDE 40 MG PO TABS: 40.0000 mg | ORAL_TABLET | ORAL | 12 refills | Status: DC | PRN

## 2017-03-12 MED ORDER — GABAPENTIN 600 MG PO TABS: 600.0000 mg | ORAL_TABLET | Freq: Four times a day (QID) | ORAL | 12 refills | Status: DC

## 2017-03-12 NOTE — Progress Notes (Signed)
PATIENT: Lynn Ponce DOB: 23-Feb-1976  REASON FOR VISIT: follow up- migraine  HISTORY FROM: patient  HISTORY OF PRESENT ILLNESS: Today April 24th 2018 [name] is a 41 year old female with a history of migraine headaches. She returns today for follow-up. She remains on Topamax 50 migraines in the morning and 100 mg in the evening. She also takes gabapentin 600 mg 4 times a day. She reports that her headaches have remained under good control. She reports she has 4-5 headaches a month. Her headaches typically occur on the right side. She does have photophobia and phonophobia as well as nausea and vomiting. She does use Phenergan for her nausea. She states that when she uses Relpax her headache will resolve in one hour. She states recently she has noticed that she has blurry vision continuously. She states that this has been an ongoing problem and recently received new glasses. She also reports that she was in the hospital for gout and arthritis of the left knee. She sees a rheumatologist in June. She also is reporting muscle spasms in the back. She states that her urologist plans to check blood work in the next week. She returns today for an evaluation.  Interval 09/11/2016: [name] is a 41 year old female with a history of migraine headaches. She returns today for follow-up. She is currently on Topamax taking 32m  in the morning and 100 mg in evening. She uses Relpax Tylenol and Phenergan for acute therapy for migraines. She states that her headaches have been under good control. She states that she has approximately 6 headaches a month. She states this has significantly decreased from what it was. She reports that her headaches typically occur on the right side above the eye. She does confirm photophobia and phonophobia. She does have nausea but typically no vomiting. For acute therapy she continues to get good benefit with Relpax, Tylenol and Phenergan. In the past she's had an ambulatory EEG  that was normal. She denies any additional seizure-type events. She remains on gabapentin 4 times a day. She reports that she will be having dental surgery this Thursday. She returns today for an evaluation.  Interval history 03/08/2016:72-hour EEG was normal. She pressed the button 3 times with her dizziness. No more episodes. She is not having any side effects to the Topamax. She has only had a few migraines since being seen. The imitrex nasal spray helps. She had one migraine for 3 daysbut she was sick with a boil at the time. She gets a migraine once a month which is resistant. She gets nause and vomiting and takes phenerrgan which helps. Recommend taking relpax, tylenol and phenergan at the onset of migraine. Can repeat Relpax if needed in 2 hours. No side effects from the topamax. She has 5-6 a month maybe.   Acute management: she has tried imitrex (nasal spray, injections and PO). She cannot take ibuprofen. Tried Reglan in the past.   PHYSICAN CONCLUSION/IMPRESSION:  This was a normal prolonged ambulatory 72-hour (69 hours completed) video EEG. No epileptiform discharges seen. No electrographic or electroclinical events present. There was no focal or background slowing seen.  There were 3 push button events. None of these correlated with an abnormality.  Owing to this prolonged VEEG being entirely normal (i.e. no interictal epileptiform discharges seen and no changes on the EEG seen with the 3 push events), other, non-epileptic causes should be considered for this patient's symptoms (e.g. convulsive syncope).   HPI: SSHAMIR SEDLARis a 41  y.o. female here as a referral from Dr. Lin Landsman for seizure. She has a PMHx of morbid obesity, HTN, diabetes, HTN, depression with a suicide attempt, diabetic neuropathy, tobacco abuse, chronic pain medications. She was in [place] and she was driving down the road and the next thing she knew she was hearing voices and slowly she was coming back to  consciousness and people were around her. She doesn't rememebr anything about the event. The next thing she remembers, her car was on a Ambulance person. She was on her way to get a biscuit that morning to eat, in her usual state of health. She had not eaten that morning, possibly her blood sugar was low. However she denies any lightheadedness, dizziness or any inciting events. She had not urinated on herself or bit her tongue. [facility] She was exhausted, felt like she had run a marathon. No illnesses, no fevers or anything out of the ordinary. She had a headache. She left the ED and then was brought back. A lady in the parking lot said she was "convulsing" In her car. No family history of seizures. Patient had febrile seizures as a child. Several years ago she had some episodes but routine EEG was normal. She would pass out and have shaking episodes. She saw a neurologist in the past Dr. Metta Clines. She is on Neurontin 650m tid. She was just placed on Topamax at misison 254m   Reviewed notes, labs and imaging from outside physicians, which showed: [facility] was unremarkable. UDS was positive for benzo and oxy (she is prescribed both). MRi of the brain and EEG unremarkable. She was diagnosed with seizure, migraine variant, syncope, withdrawal syndrome, hypoglycemic episode, conversion disorder. She had hypoxia.   Ct showed No acute intracranial abnormalities including mass lesion or mass effect, hydrocephalus, extra-axial fluid collection, midline shift, hemorrhage, or acute infarction, large ischemic events (personally reviewed images)   CT head 12/03/2009: showed No acute intracranial abnormalities including mass lesion or mass effect, hydrocephalus, extra-axial fluid collection, midline shift, hemorrhage, or acute infarction, large ischemic events (personally reviewed images)   REVIEW OF SYSTEMS: Out of a complete 14 system review of symptoms, the  patient complains only of the following symptoms, and all other reviewed systems are negative.  Unexpected weight change, excessive sweating, blurred vision, cough, wheezing, chest tightness, leg swelling, flushing, heat intolerance, restless leg, blood in urine, joint pain, joint swelling, muscle cramps, walking difficulty  ALLERGIES: Allergies  Allergen Reactions  . Sulfonamide Derivatives Anaphylaxis  . Erythromycin Hives  . Ibuprofen Other (See Comments)    Dark, tarry emesis and stool. Per pt "vomiting blood"  . Mushroom Extract Complex Hives  . Toradol [Ketorolac Tromethamine] Other (See Comments)    Dark tarry emesis and stool Per pt "vomiting blood"  . Azithromycin Rash  . Shellfish-Derived Products Rash    HOME MEDICATIONS: Outpatient Medications Prior to Visit  Medication Sig Dispense Refill  . amitriptyline (ELAVIL) 50 MG tablet Take 100 mg by mouth daily.     . Blood Glucose Monitoring Suppl (ONE TOUCH ULTRA MINI) W/DEVICE KIT     . dicyclomine (BENTYL) 10 MG capsule Take 10 mg by mouth 4 (four) times daily -  before meals and at bedtime.    . Marland Kitchenletriptan (RELPAX) 40 MG tablet Take 1 tablet (40 mg total) by mouth as needed for migraine or headache. May repeat in 2 hours if headache persists or recurs. 10 tablet 12  . escitalopram (LEXAPRO)  20 MG tablet Take 20 mg by mouth.    . furosemide (LASIX) 20 MG tablet Take 20 mg by mouth daily.    Marland Kitchen gabapentin (NEURONTIN) 600 MG tablet Take 1 tablet (600 mg total) by mouth 4 (four) times daily. 120 tablet 12  . hydrOXYzine (VISTARIL) 25 MG capsule Take 25 mg by mouth.    Marland Kitchen LATUDA 40 MG TABS tablet Take 40 mg by mouth daily with breakfast.     . levothyroxine (SYNTHROID, LEVOTHROID) 175 MCG tablet Take 175 mcg by mouth daily before breakfast.    . LITETOUCH INSULIN SYRINGE 31G X 5/16" 1 ML MISC     . lovastatin (MEVACOR) 40 MG tablet Take 40 mg by mouth.    . metFORMIN (GLUCOPHAGE) 1000 MG tablet Take 1,000 mg by mouth daily with  breakfast.    . montelukast (SINGULAIR) 10 MG tablet Take 10 mg by mouth at bedtime.    Marland Kitchen NOVOLOG MIX 70/30 (70-30) 100 UNIT/ML injection Inject 25 Units into the skin 2 (two) times daily with a meal.     . ONE TOUCH ULTRA TEST test strip     . ONETOUCH DELICA LANCETS 16B MISC     . oxyCODONE-acetaminophen (PERCOCET) 10-325 MG tablet Take 1 tablet by mouth 3 (three) times daily.     . promethazine (PHENERGAN) 25 MG tablet Take 1 tablet (25 mg total) by mouth every 6 (six) hours as needed for nausea or vomiting. 30 tablet 12  . topiramate (TOPAMAX) 50 MG tablet Take 73m in the morning and 1014min the evening. 270 tablet 3  . VENTOLIN HFA 108 (90 BASE) MCG/ACT inhaler      No facility-administered medications prior to visit.     PAST MEDICAL HISTORY: Past Medical History:  Diagnosis Date  . Anxiety   . Asthma   . Depression   . Diabetes mellitus without complication ([place]  . Hyperlipemia   . Hypertension     PAST SURGICAL HISTORY: Past Surgical History:  Procedure Laterality Date  . ADENOIDECTOMY    . ANKLE SURGERY     x1  . CHOLECYSTECTOMY    . FOOT SURGERY     x4  . OTHER SURGICAL HISTORY     lap for ovaries x6  . TONSILLECTOMY    . VAGINAL HYSTERECTOMY      FAMILY HISTORY: Family History  Problem Relation Age of Onset  . Seizures Neg Hx   . Migraines Neg Hx     SOCIAL HISTORY: Social History   Social History  . Marital status: Single    Spouse name: N/A  . Number of children: 2  . Years of education: N/A   Occupational History  . Disabled    Social History Main Topics  . Smoking status: Current Every Day Smoker    Packs/day: 1.00  . Smokeless tobacco: Never Used  . Alcohol use No  . Drug use: No  . Sexual activity: Not on file   Other Topics Concern  . Not on file   Social History Narrative   Lives with herself   Caffeine use: 2 cup per day (soda/coffee)      PHYSICAL EXAM  Vitals:   03/12/17 1101  BP: 105/76  Pulse: 64  Resp: 20    Weight: (!) 307 lb (139.3 kg)  Height: 5' 8"  (1.727 m)   Body mass index is 46.68 kg/m.  Generalized: Well developed, in no acute distress, obese Neurological examination  Mentation: Alert oriented to time, place, history  taking. Follows all commands speech and language fluent Cranial nerve II-XII: Pupils were equal round reactive to light. Extraocular movements were full, visual field were full on confrontational test. Facial sensation and strength were normal. Uvula tongue midline. Head turning and shoulder shrug  were normal and symmetric. Motor: The motor testing reveals 5 over 5 strength of all 4 extremities. Good symmetric motor tone is noted throughout.  Sensory: Sensory testing is intact to soft touch on all 4 extremities. No evidence of extinction is noted.  Coordination: Cerebellar testing reveals good finger-nose-finger and heel-to-shin bilaterally.  Gait and station: uses a cane to ambulate Reflexes: Deep tendon reflexes are symmetric and normal bilaterally.   DIAGNOSTIC DATA (LABS, IMAGING, TESTING) - I reviewed patient records, labs, notes, testing and imaging myself where available.      Component Value Date/Time   NA 145 (H) 11/08/2015 0924   K 5.0 11/08/2015 0924   CL 104 11/08/2015 0924   CO2 20 11/08/2015 0924   GLUCOSE 88 11/08/2015 0924   GLUCOSE 147 (H) 12/13/2013 0040   BUN 16 11/08/2015 0924   CREATININE 1.22 (H) 11/08/2015 0924   CALCIUM 9.3 11/08/2015 0924   PROT 7.4 11/08/2015 0924   ALBUMIN 4.4 11/08/2015 0924   AST 14 11/08/2015 0924   ALT 14 11/08/2015 0924   ALKPHOS 101 11/08/2015 0924   BILITOT 0.2 11/08/2015 0924   GFRNONAA 56 (L) 11/08/2015 0924   GFRAA 64 11/08/2015 0924     ASSESSMENT AND PLAN 41 y.o. year old female  has a past medical history of Anxiety; Asthma; Depression; Diabetes mellitus without complication ([place]); Hyperlipemia; and Hypertension. here with:  1. Migraine headaches  The patient feels that her headaches are under  relatively good control. She will continue on Topamax 50 mg in the morning and 100 mg in the evening. She will continue on gabapentin 600 mg 4 times a day. She will continue using Relpax and Phenergan. She will follow-up in 6 months with Dr. Jaynee Eagles.  I spent 15 minutes with the patient 50% of this time was discussing her medication.     Ward Givens, MSN, NP-C 03/12/2017, 11:04 AM [facility] [address], [place], [place] [address] [phone]

## 2017-03-12 NOTE — Patient Instructions (Signed)
Continue Topamax and Gabapentin Continue Relpax If your symptoms worsen or you develop new symptoms please let us know.

## 2017-03-21 DIAGNOSIS — L659 Nonscarring hair loss, unspecified: Secondary | ICD-10-CM | POA: Insufficient documentation

## 2017-03-21 DIAGNOSIS — Z87442 Personal history of urinary calculi: Secondary | ICD-10-CM | POA: Insufficient documentation

## 2017-03-21 DIAGNOSIS — Z8669 Personal history of other diseases of the nervous system and sense organs: Secondary | ICD-10-CM | POA: Insufficient documentation

## 2017-03-21 DIAGNOSIS — Z8709 Personal history of other diseases of the respiratory system: Secondary | ICD-10-CM | POA: Insufficient documentation

## 2017-03-21 NOTE — Progress Notes (Signed)
Office Visit Note  Patient: Lynn Ponce             Date of Birth: 06/16/1976           MRN: [id number]             PCP: Antonietta Jewel, MD Referring: Antonietta Jewel, MD Visit Date: 03/22/2017 Occupation: @GUAROCC @   ###########  Subjective:  Pain in multiple joints   History of Present Illness: Lynn Ponce is a 41 y.o. female seen in consultation per request of her PCP. According to patient her symptoms started in 2009 with increased fatigue and joint pain. She describes pain in her knee joints and feet. She states at the same time she was diagnosed with diabetes. She also has history of diabetic neuropathy and has difficulty walking due to that. In 2016 her symptoms got worse. She was also having IBS flare with nausea. She was experiencing increased fatigue and photosensitivity she complains of increased knee joint pain and rash. She was also having episodes of chest pain and lower back pain. She was diagnosed with hematuria and was evaluated by nephrology. She has history of kidney stones. She never had kidney biopsy. She was referred to rheumatologist and she was under care of Dr. Youlanda Mighty who did extensive labs on her and diagnosed her with possible autoimmune disease and started her on Plaquenil. Patient states that she's been on Plaquenil for 3 months and had felt somewhat better. In the meantime her symptoms got worse and she was having more of pain in her knees and feet area and she went to the emergency room in April where she had left knee joint aspiration. The synovial fluid examination revealed high white cell count and MSU crystals. She was placed on prednisone for 7 days and she felt better. She gives history of intermittent swelling in her knee joints and hands. She also gives history of photosensitivity. She has had gout for multiple years. She's been on Uloric for 2 months now she was initially taking colchicine which she discontinued.  Activities of Daily Living:  Patient  reports morning stiffness for 3 hours.   Patient Reports nocturnal pain.  Difficulty dressing/grooming: Reports Difficulty climbing stairs: Reports Difficulty getting out of chair: Reports Difficulty using hands for taps, buttons, cutlery, and/or writing: Reports   Review of Systems  Constitutional: Positive for fatigue. Negative for night sweats, weight gain, weight loss and weakness.  HENT: Positive for mouth sores and mouth dryness. Negative for trouble swallowing, trouble swallowing and nose dryness.   Eyes: Negative for pain, redness, visual disturbance and dryness.  Respiratory: Negative for cough, shortness of breath and difficulty breathing.   Cardiovascular: Negative for chest pain, palpitations, hypertension, irregular heartbeat and swelling in legs/feet.  Gastrointestinal: Positive for diarrhea and nausea. Negative for blood in stool and constipation.       History of IBS  Endocrine: Negative for increased urination.  Genitourinary: Positive for hematuria. Negative for vaginal dryness.  Musculoskeletal: Positive for arthralgias, joint pain, joint swelling, myalgias, morning stiffness and myalgias. Negative for muscle weakness and muscle tenderness.  Skin: Positive for rash and sensitivity to sunlight. Negative for color change, hair loss, skin tightness and ulcers.  Allergic/Immunologic: Negative for susceptible to infections.  Neurological: Negative for dizziness, memory loss and night sweats.  Hematological: Negative for swollen glands.  Psychiatric/Behavioral: Positive for depressed mood and sleep disturbance. The patient is nervous/anxious.     PMFS History:  Patient Active Problem List   Diagnosis Date Noted  .  Idiopathic chronic gout of left knee without tophus 03/22/2017  . Tobacco use 03/22/2017  . History of depression/ with past suicide attempt 03/22/2017  . History of IBS 03/22/2017  . History of diabetes mellitus 03/22/2017  . History of diabetic neuropathy  03/22/2017  . Hair thinning 03/21/2017  . History of kidney stones 03/21/2017  . History of asthma 03/21/2017  . History of migraine 03/21/2017  . Chronic migraine w/o aura w/o status migrainosus, not intractable 03/08/2016  . Alteration consciousness 11/08/2015  . Migraine 11/08/2015  . CELLULITIS/ABSCESS, ARM 07/15/2008  . HYPERTHYROIDISM 06/14/2008  . MORBID OBESITY 06/14/2008  . ANXIETY 06/14/2008  . MIGRAINE, CLASSICAL 06/14/2008  . ASTHMA 06/14/2008  . MENOPAUSE, SURGICAL 06/14/2008  . LYMPHADENOPATHY 06/14/2008  . Hx of cholecystectomy 06/14/2008    Past Medical History:  Diagnosis Date  . Anxiety   . Asthma   . Depression   . Diabetes mellitus without complication ([place])   . Hyperlipemia   . Hypertension     Family History  Problem Relation Age of Onset  . Seizures Neg Hx   . Migraines Neg Hx    Past Surgical History:  Procedure Laterality Date  . ADENOIDECTOMY    . ANKLE SURGERY     x1  . CHOLECYSTECTOMY    . FOOT SURGERY     x4  . OTHER SURGICAL HISTORY     lap for ovaries x6  . TONSILLECTOMY    . VAGINAL HYSTERECTOMY     Social History   Social History Narrative   Lives with herself   Caffeine use: 2 cup per day (soda/coffee)     Objective: Vital Signs: BP 134/86   Pulse 88   Resp 14   Ht 5' 8"  (1.727 m)   Wt (!) 305 lb (138.3 kg)   BMI 46.38 kg/m    Physical Exam  Constitutional: She is oriented to person, place, and time. She appears well-developed and well-nourished.  HENT:  Head: Normocephalic and atraumatic.  Eyes: Conjunctivae and EOM are normal.  Neck: Normal range of motion.  Cardiovascular: Normal rate, regular rhythm, normal heart sounds and intact distal pulses.   Pulmonary/Chest: Effort normal and breath sounds normal.  Abdominal: Soft. Bowel sounds are normal.  Lymphadenopathy:    She has no cervical adenopathy.  Neurological: She is alert and oriented to person, place, and time.  Skin: Skin is warm and dry. Capillary  refill takes less than 2 seconds.  Psychiatric: She has a normal mood and affect. Her behavior is normal.  Nursing note and vitals reviewed.    Musculoskeletal Exam: C-spine good range of motion she has mild thoracic kyphosis area and she had discomfort with range of motion of her lumbar spine with no point tenderness no SI joint tenderness was noted. She painful range of motion of her right shoulder elbow joints are good range of motion with no synovitis MCPs PIPs DIPs with good range of motion with no synovitis. She describes tenderness on palpation of her right second MCP joint and right second PIP joint. She had discomfort and pain with range of motion of her right hip joint. Both knee joints had discomfort with range of motion she is some warmth on palpation of her left knee joint. She had significant pedal edema with no tenderness on palpation of her ankle joints or MTP joints no synovitis was noted.  CDAI Exam: No CDAI exam completed.    Investigation: Findings:  08/06/2016 ANA positive with Homogeneous pattern 1:320 titer  08/31/2016 Smith and ds DNA negative ; normal Complements  03/05/2017 CBC normal, CMP normal except glucose 142 synovial fluid WBC count 53,202 cellular MSU crystals were noted., Uric acid 5.7, January 2018 chest x-ray was normal, and left knee joint x-ray showed mild osteoarthritic changes and large effusion.    Imaging: Xr Hip Unilat W Or W/o Pelvis 1v Right  Result Date: 03/22/2017 Right hip joint did not show any joint space narrowing. SI joint was partially visualized which was also within normal limits. Impression: Normal x-ray of the right hip joint  Xr Foot 2 Views Left  Result Date: 03/22/2017 PIP/DIP narrowing was noted right first MTP narrowing was noted no erosive changes were noted no MTP joint space narrowing was noted of posterior calcaneal spur was noted. Impression these findings were consistent with mild osteoarthritis of the foot  Xr Foot 2  Views Right  Result Date: 03/22/2017 PIP/DIP narrowing was noted right first MTP narrowing was noted no erosive changes were noted no MTP joint space narrowing was noted of posterior calcaneal spur was noted. Impression these findings were consistent with mild osteoarthritis of the foot  Xr Hand 2 View Left  Result Date: 03/22/2017 No MCP narrowing was noted no intercarpal radiocarpal joint space narrowing was noted. Minimal PIP joint space narrowing was noted. No erosive changes were noted. Impression x-ray was within normal limits.  Xr Hand 2 View Right  Result Date: 03/22/2017 No MCP narrowing was noted no intercarpal radiocarpal joint space narrowing was noted. Minimal PIP joint space narrowing was noted. No erosive changes were noted. Impression x-ray was within normal limits.  Xr Knee 3 View Right  Result Date: 03/22/2017 Moderate medial compartment narrowing was noted no chondrocalcinosis was noted. Moderate patellofemoral narrowing was noted. 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    Speciality Comments: No specialty comments available.    Procedures:  No procedures performed Allergies: Sulfonamide derivatives; Erythromycin; Ibuprofen; Mushroom extract complex; Toradol [ketorolac tromethamine]; Azithromycin; and Shellfish-derived products   Assessment / Plan:     Visit Diagnoses: ANA positive for records. She gives history of fatigue dry mouth oral ulcers photosensitivity and arthralgias. She was placed on Plaquenil by DR. Youlanda Mighty, about 3 months ago. She has noticed some improvement on it.  Pain in joint, multiple sites: She is pale in her right shoulder bilateral hands right hip joint, bilateral knee joints, bilateral feet. She has no synovitis on examination except for warmth on palpation of her right knee joint.  High risk medication use - PLQ 200 mg po BID  Idiopathic chronic gout of left knee without  tophus - Crystal proven in the synovial fluid. Patient gives history of long-standing history of gout. She states she was on colchicine but that was stopped after she started Uloric. She had a recent flare with large effusion which was aspirated in the emergency room in April. She still have some warmth and discomfort in her left knee.  Tobacco use: Chronic smoker smoking cessation was discussed.  History of depression/ with past suicide attempt  History of migraine  MORBID OBESITY  Hx of cholecystectomy  History of asthma  History of kidney stones: She is chronic hematuria she is also seeing nephrologist now.  History of IBS and she suffers from chronic diarrhea  Pain in right hip - Plan: XR HIP UNILAT W OR W/O PELVIS 1V RIGHT, x-ray within normal limits  Chronic pain of right knee - Plan: XR KNEE 3 VIEW RIGHT,  x-ray showed moderate osteoarthritis and moderate chondromalacia patella  Foot pain, bilateral - Plan: XR Foot 2 Views Right, XR Foot 2 Views Left, x-ray showed mild osteoarthritic changes   Bilateral hand pain - Plan: XR Hand 2 View Right, XR Hand 2 View Left , x-rays were within normal limits  History of diabetes and diabetic neuropathy   Orders: Orders Placed This Encounter  Procedures  . XR HIP UNILAT W OR W/O PELVIS 1V RIGHT  . XR KNEE 3 VIEW RIGHT  . XR Foot 2 Views Right  . XR Foot 2 Views Left  . XR Hand 2 View Right  . XR Hand 2 View Left  . Antinuclear Antib (ANA)  . [id number] ENA PANEL  . C3 and C4  . Beta-2 glycoprotein antibodies  . Cardiolipin antibodies, IgG, IgM, IgA  . Lupus anticoagulant panel  . Glucose 6 phosphate dehydrogenase  . Rheumatoid Factor  . Cyclic citrul peptide antibody, IgG  . Uric acid   No orders of the defined types were placed in this encounter.   Face-to-face time spent with patient was 50 minutes. 50% of time was spent in counseling and coordination of care.  Follow-Up Instructions: Return for Polyarthralgia, gout,  positive ANA.   Bo Merino, MD  Note - This record has been created using Editor, commissioning.  Chart creation errors have been sought, but may not always  have been located. Such creation errors do not reflect on  the standard of medical care.

## 2017-03-22 ENCOUNTER — Ambulatory Visit (INDEPENDENT_AMBULATORY_CARE_PROVIDER_SITE_OTHER): Payer: Medicare HMO

## 2017-03-22 ENCOUNTER — Ambulatory Visit (INDEPENDENT_AMBULATORY_CARE_PROVIDER_SITE_OTHER): Payer: Medicare HMO | Admitting: Rheumatology

## 2017-03-22 ENCOUNTER — Encounter: Payer: Self-pay | Admitting: Rheumatology

## 2017-03-22 VITALS — BP 134/86 | HR 88 | Resp 14 | Ht 68.0 in | Wt 305.0 lb

## 2017-03-22 DIAGNOSIS — Z79899 Other long term (current) drug therapy: Secondary | ICD-10-CM

## 2017-03-22 DIAGNOSIS — Z8669 Personal history of other diseases of the nervous system and sense organs: Secondary | ICD-10-CM | POA: Diagnosis not present

## 2017-03-22 DIAGNOSIS — Z9049 Acquired absence of other specified parts of digestive tract: Secondary | ICD-10-CM | POA: Diagnosis not present

## 2017-03-22 DIAGNOSIS — G8929 Other chronic pain: Secondary | ICD-10-CM

## 2017-03-22 DIAGNOSIS — R682 Dry mouth, unspecified: Secondary | ICD-10-CM

## 2017-03-22 DIAGNOSIS — Z8659 Personal history of other mental and behavioral disorders: Secondary | ICD-10-CM | POA: Diagnosis not present

## 2017-03-22 DIAGNOSIS — Z8719 Personal history of other diseases of the digestive system: Secondary | ICD-10-CM

## 2017-03-22 DIAGNOSIS — M79642 Pain in left hand: Secondary | ICD-10-CM

## 2017-03-22 DIAGNOSIS — Z87442 Personal history of urinary calculi: Secondary | ICD-10-CM

## 2017-03-22 DIAGNOSIS — M25551 Pain in right hip: Secondary | ICD-10-CM | POA: Diagnosis not present

## 2017-03-22 DIAGNOSIS — M2550 Pain in unspecified joint: Secondary | ICD-10-CM | POA: Diagnosis not present

## 2017-03-22 DIAGNOSIS — Z8639 Personal history of other endocrine, nutritional and metabolic disease: Secondary | ICD-10-CM

## 2017-03-22 DIAGNOSIS — Z8709 Personal history of other diseases of the respiratory system: Secondary | ICD-10-CM

## 2017-03-22 DIAGNOSIS — R7689 Other specified abnormal immunological findings in serum: Secondary | ICD-10-CM

## 2017-03-22 DIAGNOSIS — M79672 Pain in left foot: Secondary | ICD-10-CM

## 2017-03-22 DIAGNOSIS — R768 Other specified abnormal immunological findings in serum: Secondary | ICD-10-CM

## 2017-03-22 DIAGNOSIS — M79671 Pain in right foot: Secondary | ICD-10-CM

## 2017-03-22 DIAGNOSIS — Z720 Tobacco use: Secondary | ICD-10-CM

## 2017-03-22 DIAGNOSIS — M79641 Pain in right hand: Secondary | ICD-10-CM

## 2017-03-22 DIAGNOSIS — M25561 Pain in right knee: Secondary | ICD-10-CM

## 2017-03-22 DIAGNOSIS — M1A0620 Idiopathic chronic gout, left knee, without tophus (tophi): Secondary | ICD-10-CM | POA: Diagnosis not present

## 2017-03-22 DIAGNOSIS — L659 Nonscarring hair loss, unspecified: Secondary | ICD-10-CM

## 2017-03-22 MED ORDER — COLCHICINE 0.6 MG PO TABS: 0.6000 mg | ORAL_TABLET | Freq: Every day | ORAL | 2 refills | Status: DC

## 2017-03-22 NOTE — Progress Notes (Signed)
Pharmacy Note  Subjective:  Patient presents today to the [facility] to see Dr. Estanislado Pandy.  Patient is currently taking Uloric 40 mg daily and was prescribed colchicine.  Patient was seen by the pharmacist for counseling on colchicine.  Objective: CBC    Component Value Date/Time   WBC 10.8 (H) 12/13/2013 0040   RBC 4.73 12/13/2013 0040   HGB 14.0 12/13/2013 0040   HCT 40.9 12/13/2013 0040   PLT 252 12/13/2013 0040   MCV 86.5 12/13/2013 0040   MCH 29.6 12/13/2013 0040   MCHC 34.2 12/13/2013 0040   RDW 15.6 (H) 12/13/2013 0040   LYMPHSABS 4.1 (H) 12/13/2013 0040   MONOABS 0.6 12/13/2013 0040   EOSABS 0.3 12/13/2013 0040   BASOSABS 0.0 12/13/2013 0040   CMP     Component Value Date/Time   NA 145 (H) 11/08/2015 0924   K 5.0 11/08/2015 0924   CL 104 11/08/2015 0924   CO2 20 11/08/2015 0924   GLUCOSE 88 11/08/2015 0924   GLUCOSE 147 (H) 12/13/2013 0040   BUN 16 11/08/2015 0924   CREATININE 1.22 (H) 11/08/2015 0924   CALCIUM 9.3 11/08/2015 0924   PROT 7.4 11/08/2015 0924   ALBUMIN 4.4 11/08/2015 0924   AST 14 11/08/2015 0924   ALT 14 11/08/2015 0924   ALKPHOS 101 11/08/2015 0924   BILITOT 0.2 11/08/2015 0924   GFRNONAA 56 (L) 11/08/2015 0924   GFRAA 64 11/08/2015 0924   Assessment/Plan:  Counseled patient on the purpose proper use, and adverse effects of Uloric and colchicine.  Discussed the importance of taking Uloric every day to lower uric acid levels.  The possibility of recurrent gout while lowering the uric acid was explained and discussed the importance of taking colchicine daily at this time.  Reviewed patient's medications and noted she is also taking lovastatin.  Discussed increased risk of muscle aching/weakness while on statin therapy and colchicine.  Advised patient to contact us if this occurs.  Provided patient with medication education material and answered all questions.    Elisabeth Most, Pharm.D., BCPS, CPP Clinical Pharmacist Pager:  [phone] Phone: [phone] 03/22/2017 9:14 AM

## 2017-03-22 NOTE — Patient Instructions (Signed)
Colchicine tablets or capsules What is this medicine? COLCHICINE (KOL chi seen) is for joint pain and swelling due to attacks of acute gouty arthritis. The medicine is also used to treat familial Mediterranean fever. This medicine may be used for other purposes; ask your health care provider or pharmacist if you have questions. COMMON BRAND NAME(S): Colcrys, MITIGARE What should I tell my health care provider before I take this medicine? They need to know if you have any of these conditions: -anemia -blood disorders like leukemia or lymphoma -heart disease -immune system problems -intestinal disease -kidney disease -liver disease -muscle pain or weakness -take other medicines -stomach problems -an unusual or allergic reaction to colchicine, other medicines, lactose, foods, dyes, or preservatives -pregnant or trying to get pregnant -breast-feeding How should I use this medicine? Take this medicine by mouth with a full glass of water. Follow the directions on the prescription label. You can take it with or without food. If it upsets your stomach, take it with food. Take your medicine at regular intervals. Do not take your medicine more often than directed. A special MedGuide will be given to you by the pharmacist with each prescription and refill. Be sure to read this information carefully each time. Talk to your pediatrician regarding the use of this medicine in children. While this drug may be prescribed for children as young as 38 years old for selected conditions, precautions do apply. Patients over 50 years old may have a stronger reaction and need a smaller dose. Overdosage: If you think you have taken too much of this medicine contact a poison control center or emergency room at once. NOTE: This medicine is only for you. Do not share this medicine with others. What if I miss a dose? If you miss a dose, take it as soon as you can. If it is almost time for your next dose, take only that  dose. Do not take double or extra doses. What may interact with this medicine? Do not take this medicine with any of the following medications: -certain medicines for fungal infections like itraconazole This medicine may also interact with the following medications: -alcohol -certain medicines for cholesterol like atorvastatin -certain medicines for coughs and colds -certain medicines to help you breathe better -cyclosporine -digoxin -epinephrine -grapefruit or grapefruit juice -methenamine -other medicines for fungal infection -sodium bicarbonate -some antibiotics like clarithromycin, erythromycin, and telithromycin -some medicines for an irregular heartbeat or other heart problems -some medicines for cancer, like lapatinib and tamoxifen -some medicines for HIV This list may not describe all possible interactions. Give your health care provider a list of all the medicines, herbs, non-prescription drugs, or dietary supplements you use. Also tell them if you smoke, drink alcohol, or use illegal drugs. Some items may interact with your medicine. What should I watch for while using this medicine? Visit your doctor or health care professional for regular checks on your progress. You may need periodic blood checks. Alcohol can increase the chance of getting stomach problems and gout attacks. Do not drink alcohol. What side effects may I notice from receiving this medicine? Side effects that you should report to your doctor or health care professional as soon as possible: -allergic reactions like skin rash, itching or hives, swelling of the face, lips, or tongue -fever, chills, or sore throat -muscle tenderness, pain, or weakness -numbness or tingling in hands or feet -unusual bleeding or bruising -unusually weak or tired -vomiting Side effects that usually do not require medical attention (report to  your doctor or health care professional if they continue or are  bothersome): -diarrhea -hair loss -loss of appetite -stomach pain or nausea This list may not describe all possible side effects. Call your doctor for medical advice about side effects. You may report side effects to FDA at 1-800-FDA-1088. Where should I keep my medicine? Keep out of the reach of children. Store at room temperature between 15 and 30 degrees C (59 and 86 degrees F). Keep container tightly closed. Protect from light. Throw away any unused medicine after the expiration date. NOTE: This sheet is a summary. It may not cover all possible information. If you have questions about this medicine, talk to your doctor, pharmacist, or health care provider.  2018 Elsevier/Gold Standard (2013-05-04 16:48:38)

## 2017-03-23 LAB — URIC ACID: Uric Acid, Serum: 5.8 mg/dL (ref 2.5–7.0)

## 2017-03-25 LAB — BETA-2 GLYCOPROTEIN ANTIBODIES
Beta-2 Glyco I IgG: 9 SGU (ref <=20)
Beta-2-Glycoprotein I IgM: <9 SMU (ref <=20)

## 2017-03-25 LAB — CYCLIC CITRUL PEPTIDE ANTIBODY, IGG: Cyclic Citrullin Peptide Ab: <16 Units

## 2017-03-25 LAB — C3 AND C4
C3 COMPLEMENT: 226 mg/dL — AB (ref 83–193)
C4 Complement: 46 mg/dL (ref 15–57)

## 2017-03-25 LAB — CARDIOLIPIN ANTIBODIES, IGG, IGM, IGA
Anticardiolipin IgA: <11 [APL'U]
Anticardiolipin IgG: <14 [GPL'U]
Anticardiolipin IgM: 13 [MPL'U] — ABNORMAL HIGH

## 2017-03-25 LAB — RHEUMATOID FACTOR

## 2017-03-25 LAB — GLUCOSE 6 PHOSPHATE DEHYDROGENASE: G-6PDH: 15.3 U/g{Hb} (ref 7.0–20.5)

## 2017-03-26 LAB — RFLX DRVVT CONFRIM: DRVVT CONFIRMATION: POSITIVE — AB

## 2017-03-26 LAB — RFX PTT-LA W/RFX TO HEX PHASE CONF: PTT-LA SCREEN: 41 s — AB (ref <=40)

## 2017-03-26 LAB — ANTI-NUCLEAR AB-TITER (ANA TITER): ANA Titer 1: 1:320 {titer} — ABNORMAL HIGH

## 2017-03-26 LAB — CP5000020 ENA PANEL
ENA SM Ab Ser-aCnc: <1
Ribonucleic Protein(ENA) Antibody, IgG: <1
SCLERODERMA (SCL-70) (ENA) ANTIBODY, IGG: NEGATIVE
SSA (RO) (ENA) ANTIBODY, IGG: NEGATIVE
SSB (LA) (ENA) ANTIBODY, IGG: NEGATIVE
ds DNA Ab: <1 IU/mL

## 2017-03-26 LAB — RFX DRVVT 1:1 MIX

## 2017-03-26 LAB — RFX DRVVT SCR W/RFLX CONF 1:1 MIX: dRVVT Screen: 50 s — ABNORMAL HIGH (ref <=45)

## 2017-03-26 LAB — ANA: ANA: POSITIVE — AB

## 2017-03-26 LAB — LUPUS ANTICOAGULANT PANEL

## 2017-03-26 LAB — RFLX HEXAGONAL PHASE CONFIRM: Hexagonal Phase Confirm: NEGATIVE

## 2017-03-26 NOTE — Progress Notes (Signed)
Will discuss at fu

## 2017-03-28 NOTE — Progress Notes (Signed)
Personally  participated in, made any corrections needed, and agree with history, physical, neuro exam,assessment and plan as stated above.    Jessy Cybulski, MD [facility] 

## 2017-04-19 NOTE — Progress Notes (Signed)
Office Visit Note  Patient: Lynn Ponce             Date of Birth: 08/11/1976           MRN: [id number]             PCP: Antonietta Jewel, MD Referring: Antonietta Jewel, MD Visit Date: 04/25/2017 Occupation: @GUAROCC @    Subjective:  Pain in knee joints, ankle joints and feet.   History of Present Illness: Lynn Ponce is a 41 y.o. female with history of autoimmune disease gouty arthropathy and osteoarthritis. She states she had a bout of diarrhea about a month ago for which she had to go to emergency room. She was told to stop colchicine. She states she had a gout flare in her right first toe. She resumed her colchicine. The diarrhea eventually resolved which she believes was a viral illness. She hasn't had any more gout flares. She continues to have pain and stiffness in her bilateral knee joints, ankles and feet. She also has swelling in her ankles and her feet. He has been taking Plaquenil on regular basis. She had another he ER visit last Sunday for kidney stone. She passed the stone.  Activities of Daily Living:  Patient reports morning stiffness for 30 minutes.   Patient Reports nocturnal pain.  Difficulty dressing/grooming: Denies Difficulty climbing stairs: Reports Difficulty getting out of chair: Reports Difficulty using hands for taps, buttons, cutlery, and/or writing: Denies   Review of Systems  Constitutional: Positive for fatigue. Negative for night sweats, weight gain, weight loss and weakness.  HENT: Positive for mouth dryness. Negative for mouth sores, trouble swallowing, trouble swallowing and nose dryness.   Eyes: Positive for dryness. Negative for pain, redness and visual disturbance.  Respiratory: Negative for cough, shortness of breath and difficulty breathing.   Cardiovascular: Negative for chest pain, palpitations, hypertension, irregular heartbeat and swelling in legs/feet.  Gastrointestinal: Negative for blood in stool, constipation and diarrhea.  Endocrine:  Negative for increased urination.  Genitourinary: Negative for vaginal dryness.  Musculoskeletal: Positive for arthralgias, joint pain, joint swelling and morning stiffness. Negative for myalgias, muscle weakness, muscle tenderness and myalgias.  Skin: Positive for hair loss and sensitivity to sunlight. Negative for color change, rash, skin tightness and ulcers.  Allergic/Immunologic: Negative for susceptible to infections.  Neurological: Negative for dizziness, memory loss and night sweats.  Hematological: Negative for swollen glands.  Psychiatric/Behavioral: Negative for depressed mood and sleep disturbance. The patient is not nervous/anxious.     PMFS History:  Patient Active Problem List   Diagnosis Date Noted  . Idiopathic chronic gout of left knee without tophus 03/22/2017  . Tobacco use 03/22/2017  . History of depression/ with past suicide attempt 03/22/2017  . History of IBS 03/22/2017  . History of diabetes mellitus 03/22/2017  . History of diabetic neuropathy 03/22/2017  . Hair thinning 03/21/2017  . History of kidney stones 03/21/2017  . History of asthma 03/21/2017  . History of migraine 03/21/2017  . Chronic migraine w/o aura w/o status migrainosus, not intractable 03/08/2016  . Alteration consciousness 11/08/2015  . Migraine 11/08/2015  . CELLULITIS/ABSCESS, ARM 07/15/2008  . HYPERTHYROIDISM 06/14/2008  . MORBID OBESITY 06/14/2008  . ANXIETY 06/14/2008  . MIGRAINE, CLASSICAL 06/14/2008  . ASTHMA 06/14/2008  . MENOPAUSE, SURGICAL 06/14/2008  . LYMPHADENOPATHY 06/14/2008  . Hx of cholecystectomy 06/14/2008    Past Medical History:  Diagnosis Date  . Anxiety   . Asthma   . Depression   . Diabetes mellitus  without complication ([place])   . Hyperlipemia   . Hypertension   . Renal disorder     Family History  Problem Relation Age of Onset  . Seizures Neg Hx   . Migraines Neg Hx    Past Surgical History:  Procedure Laterality Date  . ADENOIDECTOMY    . ANKLE  SURGERY     x1  . CHOLECYSTECTOMY    . FOOT SURGERY     x4  . OTHER SURGICAL HISTORY     lap for ovaries x6  . TONSILLECTOMY    . VAGINAL HYSTERECTOMY     Social History   Social History Narrative   Lives with herself   Caffeine use: 2 cup per day (soda/coffee)     Objective: Vital Signs: BP 124/78   Pulse 92   Resp 18   Wt 294 lb (133.4 kg)   BMI 44.70 kg/m    Physical Exam  Constitutional: She is oriented to person, place, and time. She appears well-developed and well-nourished.  HENT:  Head: Normocephalic and atraumatic.  Eyes: Conjunctivae and EOM are normal.  Neck: Normal range of motion.  Cardiovascular: Normal rate, regular rhythm, normal heart sounds and intact distal pulses.   Mild pedal edema  Pulmonary/Chest: Effort normal and breath sounds normal.  Abdominal: Soft. Bowel sounds are normal.  Lymphadenopathy:    She has no cervical adenopathy.  Neurological: She is alert and oriented to person, place, and time.  Skin: Skin is warm and dry. Capillary refill takes less than 2 seconds.  Psychiatric: She has a normal mood and affect. Her behavior is normal.  Nursing note and vitals reviewed.    Musculoskeletal Exam: C-spine and thoracic lumbar spine good range of motion. Shoulder joints elbow joints wrist joint MCPs PIPs DIPs are good range of motion with no synovitis. Hip joints knee joints ankles MTPs PIPs were good range of motion with no synovitis. She had mild pedal edema.  CDAI Exam: CDAI Homunculus Exam:   Tenderness:  RLE: tibiofemoral and tibiotalar LLE: tibiofemoral and tibiotalar  Joint Counts:  CDAI Tender Joint count: 2 CDAI Swollen Joint count: 0  Global Assessments:  Patient Global Assessment: 4 Provider Global Assessment: 4  CDAI Calculated Score: 10    Investigation: Findings:  Labs 03/21/2017 ANA positive 1:320 titer,C3 and C4 normal, Cardiolipin antibodies, IgG less than 11, IgM less than 14, IgA 13 (indeterminate),Beta-2  glycoprotein antibodies normal ,ENA PANEL negative /normal Rheumatoid Factor normal   Uric acid normal ,CCP normal ,Glucose 6 phosphate dehydrogenase normal, and   Lupus anticoagulant panel A Lupus Anticoagulant is not detected.  Comment: The "corrected" mixing study pattern is most commonly associated with a Factor(s) deficiency - commonly acquired due to anticoagulant therapy. This suggests that the positive dRVVT Confirm is either a false positive or due to weak Lupus Anticoagulant.      Imaging: Ct Renal Stone Study  Result Date: 04/21/2017 CLINICAL DATA:  41 year old female with left abdominal, pelvic and flank pain today. EXAM: CT ABDOMEN AND PELVIS WITHOUT CONTRAST TECHNIQUE: Multidetector CT imaging of the abdomen and pelvis was performed following the standard protocol without IV contrast. COMPARISON:  04/03/2017 CT FINDINGS: Please note that parenchymal abnormalities may be missed without intravenous contrast. Lower chest: No acute abnormality Hepatobiliary: Hepatic steatosis noted without focal hepatic abnormality. The patient is status post cholecystectomy. There is no evidence of biliary dilatation. Pancreas: Unremarkable Spleen: Unremarkable Adrenals/Urinary Tract: A 3 mm distal left ureteral calculus (3 cm above the left UVJ) causes mild  left hydroureteronephrosis. Multiple nonobstructing punctate bilateral renal calculi are identified. The adrenal glands and bladder are unremarkable. Stomach/Bowel: Stomach is within normal limits. No evidence of bowel wall thickening, distention, or inflammatory changes. Vascular/Lymphatic: Aortic atherosclerotic calcifications noted without aneurysm. No enlarged lymph nodes identified. Reproductive: Status post hysterectomy. No adnexal masses. Other: No free fluid, focal collection or pneumoperitoneum. Musculoskeletal: No acute abnormality. Right sacroiliitis identified. IMPRESSION: 3 mm distal left ureteral calculus causing mild left hydroureteronephrosis.  Multiple nonobstructing punctate bilateral renal calculi. Right sacroiliitis.  Correlate clinically. Hepatic steatosis. Abdominal aortic atherosclerosis. Electronically Signed   By: Margarette Canada M.D.   On: 04/21/2017 14:18    Speciality Comments: No specialty comments available.    Procedures:  No procedures performed Allergies: Sulfonamide derivatives; Erythromycin; Ibuprofen; Mushroom extract complex; Toradol [ketorolac tromethamine]; Azithromycin; and Shellfish-derived products   Assessment / Plan:     Visit Diagnoses: Autoimmune disease ([place]) - +ANA1:320NH, ENA-, acl,LA,b2 negative , history of fatigue, dry mouth, oral ulcers, photosensitivity, arthralgias. Besides ANA all autoimmune labs were negative. Although based on her symptoms and improvement on Plaquenil we will continue Plaquenil for right now. I may consider obtaining AVISE Helaine Chess follow-up if she is in agreement.  High risk medication use - Plaquenil 200 mg by mouth twice a day started by Dr.Zulkoska 2/18  Idiopathic chronic gout of left knee without tophus - Crystal proven, on Uloric. Her uric acid levels weren't desired range. She is doing quite well on combination of Uloric and colchicine.  Tobacco use: The smoking cessation was discussed.  Her other medical problems are listed as follows:  History of anxiety/ depression/ with past suicide attempt  History of IBS  History of diabetes mellitus  History of diabetic neuropathy  History of migraine  History of lymphadenopathy  Hx of cholecystectomy  History of asthma  MORBID OBESITY  History of kidney stones    Orders: No orders of the defined types were placed in this encounter.  No orders of the defined types were placed in this encounter.   Face-to-face time spent with patient was 30 minutes. 50% of time was spent in counseling and coordination of care.  Follow-Up Instructions: Return in about 5 months (around 09/25/2017) for Autoimmune disease,  Gout.   Bo Merino, MD  Note - This record has been created using Editor, commissioning.  Chart creation errors have been sought, but may not always  have been located. Such creation errors do not reflect on  the standard of medical care.

## 2017-04-21 ENCOUNTER — Emergency Department (HOSPITAL_COMMUNITY)
Admission: EM | Admit: 2017-04-21 | Discharge: 2017-04-21 | Disposition: A | Discharge status: Home / Self Care | Payer: Medicare HMO | Attending: Emergency Medicine | Admitting: Emergency Medicine

## 2017-04-21 ENCOUNTER — Encounter (HOSPITAL_COMMUNITY): Payer: Self-pay | Admitting: Emergency Medicine

## 2017-04-21 ENCOUNTER — Emergency Department (HOSPITAL_COMMUNITY): Payer: Medicare HMO

## 2017-04-21 DIAGNOSIS — F17210 Nicotine dependence, cigarettes, uncomplicated: Secondary | ICD-10-CM | POA: Diagnosis not present

## 2017-04-21 DIAGNOSIS — E119 Type 2 diabetes mellitus without complications: Secondary | ICD-10-CM | POA: Insufficient documentation

## 2017-04-21 DIAGNOSIS — Z794 Long term (current) use of insulin: Secondary | ICD-10-CM | POA: Insufficient documentation

## 2017-04-21 DIAGNOSIS — I10 Essential (primary) hypertension: Secondary | ICD-10-CM | POA: Diagnosis not present

## 2017-04-21 DIAGNOSIS — N201 Calculus of ureter: Secondary | ICD-10-CM | POA: Insufficient documentation

## 2017-04-21 DIAGNOSIS — R109 Unspecified abdominal pain: Secondary | ICD-10-CM | POA: Diagnosis present

## 2017-04-21 DIAGNOSIS — J45909 Unspecified asthma, uncomplicated: Secondary | ICD-10-CM | POA: Insufficient documentation

## 2017-04-21 DIAGNOSIS — Z79899 Other long term (current) drug therapy: Secondary | ICD-10-CM | POA: Diagnosis not present

## 2017-04-21 HISTORY — DX: Disorder of kidney and ureter, unspecified: N28.9

## 2017-04-21 LAB — URINALYSIS, ROUTINE W REFLEX MICROSCOPIC
Bilirubin Urine: NEGATIVE
Glucose, UA: NEGATIVE mg/dL
Ketones, ur: NEGATIVE mg/dL
Leukocytes, UA: NEGATIVE
NITRITE: NEGATIVE
PROTEIN: NEGATIVE mg/dL
Specific Gravity, Urine: 1.018 (ref 1.005–1.030)
pH: 7 (ref 5.0–8.0)

## 2017-04-21 LAB — CBC
HEMATOCRIT: 40.5 % (ref 36.0–46.0)
Hemoglobin: 13.3 g/dL (ref 12.0–15.0)
MCH: 29.3 pg (ref 26.0–34.0)
MCHC: 32.8 g/dL (ref 30.0–36.0)
MCV: 89.2 fL (ref 78.0–100.0)
PLATELETS: 203 10*3/uL (ref 150–400)
RBC: 4.54 MIL/uL (ref 3.87–5.11)
RDW: 13.7 % (ref 11.5–15.5)
WBC: 7.4 10*3/uL (ref 4.0–10.5)

## 2017-04-21 LAB — BASIC METABOLIC PANEL
ANION GAP: 9 (ref 5–15)
BUN: 13 mg/dL (ref 6–20)
CALCIUM: 8.9 mg/dL (ref 8.9–10.3)
CO2: 23 mmol/L (ref 22–32)
Chloride: 105 mmol/L (ref 101–111)
Creatinine, Ser: 1.36 mg/dL — ABNORMAL HIGH (ref 0.44–1.00)
GFR, EST AFRICAN AMERICAN: 55 mL/min — AB (ref >60)
GFR, EST NON AFRICAN AMERICAN: 47 mL/min — AB (ref >60)
Glucose, Bld: 120 mg/dL — ABNORMAL HIGH (ref 65–99)
Potassium: 4.1 mmol/L (ref 3.5–5.1)
SODIUM: 137 mmol/L (ref 135–145)

## 2017-04-21 MED ORDER — HYDROMORPHONE HCL 1 MG/ML IJ SOLN
1.0000 mg | Freq: Once | INTRAMUSCULAR | Status: AC
  Administered 2017-04-21: 1 mg via INTRAVENOUS
  Filled 2017-04-21: qty 1

## 2017-04-21 MED ORDER — OXYCODONE HCL 15 MG PO TABS: 15.0000 mg | ORAL_TABLET | ORAL | 0 refills | Status: DC | PRN

## 2017-04-21 MED ORDER — LORAZEPAM 2 MG/ML IJ SOLN
1.0000 mg | Freq: Once | INTRAMUSCULAR | Status: AC
  Administered 2017-04-21: 1 mg via INTRAVENOUS
  Filled 2017-04-21: qty 1

## 2017-04-21 NOTE — ED Triage Notes (Signed)
TO ED via [facility] from home-- with c/o left flank pain radiating to lower abd/vaginal area-- hx of kidney stones-- treated at [facility]-- pt is crying continually, unable to get comfortable. Has been seeing blood in urine recently.

## 2017-04-21 NOTE — ED Provider Notes (Signed)
Lynn Ponce Provider Note   CSN: [id number] Arrival date & time: 04/21/17  1216     History   Chief Complaint Chief Complaint  Patient presents with  . Flank Pain  . Abdominal Pain    HPI Lynn Ponce is a 41 y.o. female.  HPI Patient presents to the emergency department with left flank pain that started early this morning.  The patient states that she started with left flank pain and the pain radiates down into her urethral area.  Patient states that nothing seems make the condition better or worse.  She states she did take some Percocet at home without relief of her symptoms.  Patient states that she sees a urologist in [facility]. The patient denies chest pain, shortness of breath, headache,blurred vision, neck pain, fever, cough, weakness, numbness, dizziness, anorexia, edema,  vomiting, diarrhea, rash, back pain, dysuria, hematemesis, bloody stool, near syncope, or syncope. Past Medical History:  Diagnosis Date  . Anxiety   . Asthma   . Depression   . Diabetes mellitus without complication ([address])   . Hyperlipemia   . Hypertension   . Renal disorder     Patient Active Problem List   Diagnosis Date Noted  . Idiopathic chronic gout of left knee without tophus 03/22/2017  . Tobacco use 03/22/2017  . History of depression/ with past suicide attempt 03/22/2017  . History of IBS 03/22/2017  . History of diabetes mellitus 03/22/2017  . History of diabetic neuropathy 03/22/2017  . Hair thinning 03/21/2017  . History of kidney stones 03/21/2017  . History of asthma 03/21/2017  . History of migraine 03/21/2017  . Chronic migraine w/o aura w/o status migrainosus, not intractable 03/08/2016  . Alteration consciousness 11/08/2015  . Migraine 11/08/2015  . CELLULITIS/ABSCESS, ARM 07/15/2008  . HYPERTHYROIDISM 06/14/2008  . MORBID OBESITY 06/14/2008  . ANXIETY 06/14/2008  . MIGRAINE, CLASSICAL 06/14/2008  . ASTHMA 06/14/2008  . MENOPAUSE, SURGICAL 06/14/2008  .  LYMPHADENOPATHY 06/14/2008  . Hx of cholecystectomy 06/14/2008    Past Surgical History:  Procedure Laterality Date  . ADENOIDECTOMY    . ANKLE SURGERY     x1  . CHOLECYSTECTOMY    . FOOT SURGERY     x4  . OTHER SURGICAL HISTORY     lap for ovaries x6  . TONSILLECTOMY    . VAGINAL HYSTERECTOMY      OB History    No data available       Home Medications    Prior to Admission medications   Medication Sig Start Date End Date Taking? Authorizing Provider  amitriptyline (ELAVIL) 50 MG tablet Take 100 mg by mouth daily.  10/12/15  Yes [provider]  atenolol (TENORMIN) 100 MG tablet Take 100 mg by mouth 2 (two) times daily.  03/13/17  Yes [provider]  colchicine 0.6 MG tablet Take 1 tablet (0.6 mg total) by mouth daily. May take 1 tablet twice daily during flare. 03/22/17  Yes Deveshwar, Abel Presto, MD  dicyclomine (BENTYL) 10 MG capsule Take 10 mg by mouth 4 (four) times daily -  before meals and at bedtime.   Yes [provider]  eletriptan (RELPAX) 40 MG tablet Take 1 tablet (40 mg total) by mouth as needed for migraine or headache. May repeat in 2 hours if headache persists or recurs. 03/12/17  Yes Ward Givens, NP  escitalopram (LEXAPRO) 20 MG tablet Take 20 mg by mouth.   Yes [provider]  furosemide (LASIX) 20 MG tablet Take 20  mg by mouth daily.   Yes [provider]  gabapentin (NEURONTIN) 600 MG tablet Take 1 tablet (600 mg total) by mouth 4 (four) times daily. 03/12/17  Yes Ward Givens, NP  hydrOXYzine (VISTARIL) 25 MG capsule Take 25 mg by mouth 3 (three) times daily.    Yes [provider]  LATUDA 40 MG TABS tablet Take 40 mg by mouth daily with breakfast.  10/11/15  Yes [provider]  levothyroxine (SYNTHROID, LEVOTHROID) 175 MCG tablet Take 175 mcg by mouth daily before breakfast.   Yes [provider]  lovastatin (MEVACOR) 40 MG tablet Take 40 mg by mouth at bedtime.    Yes [provider]  metFORMIN (GLUCOPHAGE) 1000 MG tablet Take 1,000 mg by mouth daily with breakfast.   Yes [provider]  montelukast (SINGULAIR) 10 MG tablet Take 10 mg by mouth at bedtime.   Yes [provider]  NOVOLOG MIX 70/30 (70-30) 100 UNIT/ML injection Inject 25 Units into the skin 2 (two) times daily with a meal.  10/24/15  Yes [provider]  omeprazole (PRILOSEC) 20 MG capsule  03/20/17  Yes [provider]  oxyCODONE-acetaminophen (PERCOCET) 10-325 MG tablet Take 1 tablet by mouth 3 (three) times daily.  10/12/15  Yes [provider]  promethazine (PHENERGAN) 25 MG tablet Take 1 tablet (25 mg total) by mouth every 6 (six) hours as needed for nausea or vomiting. 03/12/17  Yes Ward Givens, NP  topiramate (TOPAMAX) 50 MG tablet Take 16m in the morning and 1060min the evening. 09/11/16  Yes Millikan, [name], NP  ULORIC 40 MG tablet Take 40 mg by mouth daily.  02/13/17  Yes [provider]  VENTOLIN HFA 108 (90 BASE) MCG/ACT inhaler Inhale 2 puffs into the lungs every 6 (six) hours as needed for shortness of breath.  11/04/15  Yes [provider]    Family History Family History  Problem Relation Age of Onset  . Seizures Neg Hx   . Migraines Neg Hx     Social History Social History  Substance Use Topics  . Smoking status: Current Every Day Smoker    Packs/day: 1.00    Types: Cigarettes  . Smokeless tobacco: Never Used  . Alcohol use No     Allergies   Sulfonamide derivatives; Erythromycin; Ibuprofen; Mushroom extract complex; Toradol [ketorolac tromethamine]; Azithromycin; and Shellfish-derived products   Review of Systems Review of Systems  All other systems negative except as documented in the HPI. All pertinent positives and negatives as reviewed in the HPI. Physical Exam Updated Vital Signs BP (!) 112/55   Pulse 85   Temp 97.9 F (36.6 C) (Oral)   Resp (!) 22   Ht 5' 8"  (1.727 m)   Wt (!) 138.3  kg (305 lb)   SpO2 94%   BMI 46.38 kg/m   Physical Exam  Constitutional: She is oriented to person, place, and time. She appears well-developed and well-nourished. No distress.  HENT:  Head: Normocephalic and atraumatic.  Mouth/Throat: Oropharynx is clear and moist.  Eyes: Pupils are equal, round, and reactive to light.  Neck: Normal range of motion. Neck supple.  Cardiovascular: Normal rate, regular rhythm and normal heart sounds.  Exam reveals no gallop and no friction rub.   No murmur heard. Pulmonary/Chest: Effort normal and breath sounds normal. No respiratory distress. She has no wheezes.  Abdominal: Soft. Bowel sounds are normal. She exhibits no distension and no mass. There is tenderness. There is no rebound and  no guarding.  Neurological: She is alert and oriented to person, place, and time. She exhibits normal muscle tone. Coordination normal.  Skin: Skin is warm and dry. Capillary refill takes less than 2 seconds. No rash noted. No erythema.  Psychiatric: She has a normal mood and affect. Her behavior is normal.  Nursing note and vitals reviewed.    ED Treatments / Results  Labs (all labs ordered are listed, but only abnormal results are displayed) Labs Reviewed  URINALYSIS, ROUTINE W REFLEX MICROSCOPIC - Abnormal; Notable for the following:       Result Value   Hgb urine dipstick SMALL (*)    Bacteria, UA RARE (*)    Squamous Epithelial / LPF 0-5 (*)    All other components within normal limits  BASIC METABOLIC PANEL - Abnormal; Notable for the following:    Glucose, Bld 120 (*)    Creatinine, Ser 1.36 (*)    GFR calc non Af Amer 47 (*)    GFR calc Af Amer 55 (*)    All other components within normal limits  CBC    EKG  EKG Interpretation None       Radiology Ct Renal Stone Study  Result Date: 04/21/2017 CLINICAL DATA:  41 year old female with left abdominal, pelvic and flank pain today. EXAM: CT ABDOMEN AND PELVIS WITHOUT CONTRAST TECHNIQUE:  Multidetector CT imaging of the abdomen and pelvis was performed following the standard protocol without IV contrast. COMPARISON:  04/03/2017 CT FINDINGS: Please note that parenchymal abnormalities may be missed without intravenous contrast. Lower chest: No acute abnormality Hepatobiliary: Hepatic steatosis noted without focal hepatic abnormality. The patient is status post cholecystectomy. There is no evidence of biliary dilatation. Pancreas: Unremarkable Spleen: Unremarkable Adrenals/Urinary Tract: A 3 mm distal left ureteral calculus (3 cm above the left UVJ) causes mild left hydroureteronephrosis. Multiple nonobstructing punctate bilateral renal calculi are identified. The adrenal glands and bladder are unremarkable. Stomach/Bowel: Stomach is within normal limits. No evidence of bowel wall thickening, distention, or inflammatory changes. Vascular/Lymphatic: Aortic atherosclerotic calcifications noted without aneurysm. No enlarged lymph nodes identified. Reproductive: Status post hysterectomy. No adnexal masses. Other: No free fluid, focal collection or pneumoperitoneum. Musculoskeletal: No acute abnormality. Right sacroiliitis identified. IMPRESSION: 3 mm distal left ureteral calculus causing mild left hydroureteronephrosis. Multiple nonobstructing punctate bilateral renal calculi. Right sacroiliitis.  Correlate clinically. Hepatic steatosis. Abdominal aortic atherosclerosis. Electronically Signed   By: Margarette Canada M.D.   On: 04/21/2017 14:18    Procedures Procedures (including critical care time)  Medications Ordered in ED Medications  HYDROmorphone (DILAUDID) injection 1 mg (1 mg Intravenous Given 04/21/17 1259)  HYDROmorphone (DILAUDID) injection 1 mg (1 mg Intravenous Given 04/21/17 1501)     Initial Impression / Assessment and Plan / ED Course  I have reviewed the triage vital signs and the nursing notes.  Pertinent labs & imaging results that were available during my care of the patient were  reviewed by me and considered in my medical decision making (see chart for details).     The patient has a 3 mm sent identified in the left distal ureter.  Patient does feel significant improvement in her symptoms.  She was crying and rolling around them and she initially got here, she is now sitting up in bed drinking water.  Plans have the patient follow-up with her urologist.  Told to return here as needed.  Patient agrees the plan.  All questions were answered.  Final Clinical Impressions(s) / ED Diagnoses   Final diagnoses:  None    New Prescriptions New Prescriptions   No medications on file     Dalia Heading, Hershal Coria 04/21/17 1542    Julianne Rice, MD 04/25/17 [phone]

## 2017-04-21 NOTE — Discharge Instructions (Signed)
You will need to see your urologist as soon as possible.  Return here as needed.

## 2017-04-21 NOTE — ED Notes (Signed)
Urine drain given to pt at discharge, verbalized understanding of use.

## 2017-04-21 NOTE — ED Notes (Signed)
Patient transported to CT 

## 2017-04-25 ENCOUNTER — Encounter (INDEPENDENT_AMBULATORY_CARE_PROVIDER_SITE_OTHER): Payer: Self-pay

## 2017-04-25 ENCOUNTER — Ambulatory Visit: Payer: Medicare HMO | Admitting: Rheumatology

## 2017-04-25 ENCOUNTER — Ambulatory Visit (INDEPENDENT_AMBULATORY_CARE_PROVIDER_SITE_OTHER): Payer: Medicare HMO | Admitting: Rheumatology

## 2017-04-25 ENCOUNTER — Encounter: Payer: Self-pay | Admitting: Rheumatology

## 2017-04-25 VITALS — BP 124/78 | HR 92 | Resp 18 | Wt 294.0 lb

## 2017-04-25 DIAGNOSIS — Z9049 Acquired absence of other specified parts of digestive tract: Secondary | ICD-10-CM

## 2017-04-25 DIAGNOSIS — D8989 Other specified disorders involving the immune mechanism, not elsewhere classified: Secondary | ICD-10-CM | POA: Diagnosis not present

## 2017-04-25 DIAGNOSIS — Z720 Tobacco use: Secondary | ICD-10-CM | POA: Diagnosis not present

## 2017-04-25 DIAGNOSIS — M1A0620 Idiopathic chronic gout, left knee, without tophus (tophi): Secondary | ICD-10-CM | POA: Diagnosis not present

## 2017-04-25 DIAGNOSIS — Z8659 Personal history of other mental and behavioral disorders: Secondary | ICD-10-CM

## 2017-04-25 DIAGNOSIS — Z87442 Personal history of urinary calculi: Secondary | ICD-10-CM

## 2017-04-25 DIAGNOSIS — Z79899 Other long term (current) drug therapy: Secondary | ICD-10-CM

## 2017-04-25 DIAGNOSIS — M359 Systemic involvement of connective tissue, unspecified: Secondary | ICD-10-CM

## 2017-04-25 DIAGNOSIS — Z8669 Personal history of other diseases of the nervous system and sense organs: Secondary | ICD-10-CM

## 2017-04-25 DIAGNOSIS — Z8709 Personal history of other diseases of the respiratory system: Secondary | ICD-10-CM

## 2017-04-25 DIAGNOSIS — Z8639 Personal history of other endocrine, nutritional and metabolic disease: Secondary | ICD-10-CM

## 2017-04-25 DIAGNOSIS — Z8719 Personal history of other diseases of the digestive system: Secondary | ICD-10-CM

## 2017-04-25 DIAGNOSIS — Z87898 Personal history of other specified conditions: Secondary | ICD-10-CM

## 2017-05-24 ENCOUNTER — Ambulatory Visit: Payer: Medicare HMO | Admitting: Rheumatology

## 2017-07-17 ENCOUNTER — Other Ambulatory Visit: Payer: Self-pay | Admitting: Rheumatology

## 2017-07-18 NOTE — Telephone Encounter (Signed)
Last Visit: 04/25/17 Next Visit: 07/26/17 Labs: 03/05/17 Elevated glucose   Okay to refill per Dr.Deveshwar

## 2017-08-30 ENCOUNTER — Other Ambulatory Visit: Payer: Self-pay | Admitting: Rheumatology

## 2017-08-30 NOTE — Telephone Encounter (Addendum)
Last Visit: 04/25/17 Next Visit: 09/24/17 Labs: 03/05/17 Elevated glucose   Patient advised she is due to update labs. Patient will update next week.  Okay to refill 30 day supply per Dr. Estanislado Pandy

## 2017-09-09 ENCOUNTER — Other Ambulatory Visit: Payer: Self-pay | Admitting: Rheumatology

## 2017-09-10 ENCOUNTER — Ambulatory Visit (INDEPENDENT_AMBULATORY_CARE_PROVIDER_SITE_OTHER): Payer: Medicare HMO | Admitting: Neurology

## 2017-09-10 ENCOUNTER — Encounter (INDEPENDENT_AMBULATORY_CARE_PROVIDER_SITE_OTHER): Payer: Self-pay

## 2017-09-10 ENCOUNTER — Encounter: Payer: Self-pay | Admitting: Neurology

## 2017-09-10 VITALS — BP 106/75 | HR 78 | Wt 306.4 lb

## 2017-09-10 DIAGNOSIS — G43709 Chronic migraine without aura, not intractable, without status migrainosus: Secondary | ICD-10-CM | POA: Diagnosis not present

## 2017-09-10 NOTE — Patient Instructions (Signed)
Magnesium citrate 462m to 608mdaily, riboflavin 40078mCoenzyme Q 10 100m4mree times daily  Increase Topiramate to 200mg79me at night  Consider Ajovy ("cgrp migraine" google) discuss with rheumatology

## 2017-09-10 NOTE — Progress Notes (Addendum)
[facility]    Provider:  Dr Jaynee Eagles Referring Provider: Antonietta Jewel, MD Primary Care Physician:  Antonietta Jewel, MD  REASON FOR VISIT: follow up- migraine  HISTORY FROM: patient  HISTORY OF PRESENT ILLNESS: Today April 24th 2018 [name] is a 41 year old female with a history of migraine headaches. She returns today for follow-up. She remains on Topamax 50 migraines in the morning and 100 mg in the evening. She also takes gabapentin 600 mg 4 times a day. She reports that her headaches have remained under good control. She reports she has 4-5 headaches a month. Her headaches typically occur on the right side. She does have photophobia and phonophobia as well as nausea and vomiting. She does use Phenergan for her nausea. She states that when she uses Relpax her headache will resolve in one hour. She states recently she has noticed that she has blurry vision continuously. She states that this has been an ongoing problem and recently received new glasses. She also reports that she was in the hospital for gout and arthritis of the left knee. She sees a rheumatologist in June. She also is reporting muscle spasms in the back. She states that her urologist plans to check blood work in the next week. She returns today for an evaluation.  Interval history 09/10/2017: Patient is here for follow-up of migraines. She continued to have migraines. Had a very long discussion about migraine management, the options, which included increasing topiramate or starting another medication(discussed other oral medication possibilities, their side effects). Also discussed the new CGRP migraine medications and asked her to discuss this with rheumatology. Spent extensive time discussing different treatment options with patient.  Interval 09/11/2016:[name] is a 41 year old female with a history of migraine headaches. She returns today for follow-up. She is currently on Topamax taking 76m in the  morning and 100 mg in evening. She uses Relpax Tylenol and Phenergan for acute therapy for migraines. She states that her headaches have been under good control. She states that she has approximately 6 headaches a month. She states this has significantly decreased from what it was. She reports that her headaches typically occur on the right side above the eye. She does confirm photophobia and phonophobia. She does have nausea but typically no vomiting. For acute therapy she continues to get good benefit with Relpax, Tylenol and Phenergan. In the past she's had an ambulatory EEG that was normal. She denies any additional seizure-type events. She remains on gabapentin 4 times a day. She reports that she will be having dental surgery this Thursday. She returns today for an evaluation.  Interval history 03/08/2016:72-hour EEG was normal. She pressed the button 3 times with her dizziness. No more episodes. She is not having any side effects to the Topamax. She has only had a few migraines since being seen. The imitrex nasal spray helps. She had one migraine for 3 daysbut she was sick with a boil at the time. She gets a migraine once a month which is resistant. She gets nause and vomiting and takes phenerrgan which helps. Recommend taking relpax, tylenol and phenergan at the onset of migraine. Can repeat Relpax if needed in 2 hours. No side effects from the topamax. She has 5-6 a month maybe.   Acute management: she has tried imitrex (nasal spray, injections and PO). She cannot take ibuprofen. Tried Reglan in the past.   PHYSICAN CONCLUSION/IMPRESSION:  This was a normal prolonged ambulatory 72-hour (69 hours completed) video EEG. No epileptiform discharges seen.  No electrographic or electroclinical events present. There was no focal or background slowing seen.  There were 3 push button events. None of these correlated with an abnormality.  Owing to this prolonged VEEG being entirely normal (i.e. no  interictal epileptiform discharges seen and no changes on the EEG seen with the 3 push events), other, non-epileptic causes should be considered for this patient's symptoms (e.g. convulsive syncope).   HPI: Lynn Ponce is a 41 y.o. female here as a referral from Dr. Lin Landsman for seizure. She has a PMHx of morbid obesity, HTN, diabetes, HTN, depression with a suicide attempt, diabetic neuropathy, tobacco abuse, chronic pain medications. She was in [place] and she was driving down the road and the next thing she knew she was hearing voices and slowly she was coming back to consciousness and people were around her. She doesn't rememebr anything about the event. The next thing she remembers, her car was on a Ambulance person. She was on her way to get a biscuit that morning to eat, in her usual state of health. She had not eaten that morning, possibly her blood sugar was low. However she denies any lightheadedness, dizziness or any inciting events. She had not urinated on herself or bit her tongue. [facility] She was exhausted, felt like she had run a marathon. No illnesses, no fevers or anything out of the ordinary. She had a headache. She left the ED and then was brought back. A lady in the parking lot said she was "convulsing" In her car. No family history of seizures. Patient had febrile seizures as a child. Several years ago she had some episodes but routine EEG was normal. She would pass out and have shaking episodes. She saw a neurologist in the past Dr. Metta Clines. She is on Neurontin 655m tid. She was just placed on Topamax at misison 256m   Reviewed notes, labs and imaging from outside physicians, which showed: [facility] was unremarkable. UDS was positive for benzo and oxy (she is prescribed both). MRi of the brain and EEG unremarkable. She was diagnosed with seizure, migraine variant, syncope, withdrawal syndrome, hypoglycemic episode, conversion  disorder. She had hypoxia.   Ct showed No acute intracranial abnormalities including mass lesion or mass effect, hydrocephalus, extra-axial fluid collection, midline shift, hemorrhage, or acute infarction, large ischemic events (personally reviewed images)   CT head 12/03/2009: showed No acute intracranial abnormalities including mass lesion or mass effect, hydrocephalus, extra-axial fluid collection, midline shift, hemorrhage, or acute infarction, large ischemic events (personally reviewed images)   REVIEW OF SYSTEMS: Out of a complete 14 system review of symptoms, the patient complains only of the following symptoms, and all other reviewed systems are negative.  Unexpected weight change, excessive sweating, blurred vision, cough, wheezing, chest tightness, leg swelling, flushing, heat intolerance, restless leg, blood in urine, joint pain, joint swelling, muscle cramps, walking difficulty  Social History   Social History  . Marital status: Single    Spouse name: N/A  . Number of children: 2  . Years of education: N/A   Occupational History  . Disabled    Social History Main Topics  . Smoking status: Current Every Day Smoker    Packs/day: 1.00    Types: Cigarettes  . Smokeless tobacco: Never Used  . Alcohol use No  . Drug use: No  . Sexual activity: Not on file   Other Topics Concern  . Not on file   Social History Narrative  Lives at home alone    Right handed   Caffeine use: 1 cup per day (soda/coffee)    Family History  Problem Relation Age of Onset  . Seizures Neg Hx   . Migraines Neg Hx     Past Medical History:  Diagnosis Date  . Anxiety   . Asthma   . Depression   . Diabetes mellitus without complication ([place])   . Hyperlipemia   . Hypertension   . Renal disorder     Past Surgical History:  Procedure Laterality Date  . ADENOIDECTOMY    . ANKLE SURGERY     x1  . CHOLECYSTECTOMY    . FOOT SURGERY     x4  . OTHER SURGICAL HISTORY     lap for  ovaries x6  . TONSILLECTOMY    . VAGINAL HYSTERECTOMY      Current Outpatient Prescriptions  Medication Sig Dispense Refill  . amitriptyline (ELAVIL) 50 MG tablet Take 100 mg by mouth daily.     Marland Kitchen atenolol (TENORMIN) 100 MG tablet Take 100 mg by mouth 2 (two) times daily.     . Colchicine 0.6 MG CAPS TAKE 1 CAPSULE BY MOUTH DAILY. MAY TAKE 1 CAPSULE TWICE DAILY DURING FLARE. 40 capsule 0  . dicyclomine (BENTYL) 10 MG capsule Take 10 mg by mouth 4 (four) times daily -  before meals and at bedtime.    Marland Kitchen eletriptan (RELPAX) 40 MG tablet Take 1 tablet (40 mg total) by mouth as needed for migraine or headache. May repeat in 2 hours if headache persists or recurs. 10 tablet 12  . escitalopram (LEXAPRO) 20 MG tablet Take 20 mg by mouth.    . furosemide (LASIX) 20 MG tablet Take 20 mg by mouth daily.    Marland Kitchen gabapentin (NEURONTIN) 600 MG tablet Take 1 tablet (600 mg total) by mouth 4 (four) times daily. 120 tablet 12  . hydroxychloroquine (PLAQUENIL) 200 MG tablet Take 200 mg by mouth 2 (two) times daily.    . hydrOXYzine (VISTARIL) 50 MG capsule     . LATUDA 40 MG TABS tablet Take 40 mg by mouth daily with breakfast.     . LEVOTHYROXINE SODIUM PO Take 225 mcg by mouth daily.    Marland Kitchen lovastatin (MEVACOR) 40 MG tablet Take 40 mg by mouth at bedtime.     . metFORMIN (GLUCOPHAGE) 1000 MG tablet Take 1,000 mg by mouth daily with breakfast.    . mirabegron ER (MYRBETRIQ) 50 MG TB24 tablet Take 50 mg by mouth daily.    . montelukast (SINGULAIR) 10 MG tablet Take 10 mg by mouth at bedtime.    . Multiple Vitamins-Minerals (CENTRUM WOMEN PO) Take 1 tablet by mouth daily.    Marland Kitchen NOVOLOG MIX 70/30 (70-30) 100 UNIT/ML injection Inject 25 Units into the skin 2 (two) times daily with a meal.     . omeprazole (PRILOSEC) 20 MG capsule     . oxyCODONE-acetaminophen (PERCOCET) 10-325 MG tablet Take 1 tablet by mouth 3 (three) times daily.     . potassium citrate (UROCIT-K) 10 MEQ (1080 MG) SR tablet Take 3 tablets by mouth  2 (two) times daily.    . promethazine (PHENERGAN) 25 MG tablet Take 1 tablet (25 mg total) by mouth every 6 (six) hours as needed for nausea or vomiting. 15 tablet 12  . topiramate (TOPAMAX) 50 MG tablet Take 50m in the morning and 1085min the evening. 270 tablet 3  . ULORIC 40 MG tablet Take 40 mg  by mouth daily.     . VENTOLIN HFA 108 (90 BASE) MCG/ACT inhaler Inhale 2 puffs into the lungs every 6 (six) hours as needed for shortness of breath.      No current facility-administered medications for this visit.     Allergies as of 09/10/2017 - Review Complete 09/10/2017  Allergen Reaction Noted  . Sulfonamide derivatives Anaphylaxis 06/14/2008  . Erythromycin Hives 12/13/2013  . Ibuprofen Other (See Comments) 06/14/2008  . Mushroom extract complex Hives 12/13/2013  . Toradol [ketorolac tromethamine] Other (See Comments) 12/13/2013  . Azithromycin Rash 12/16/2014    Vitals: BP 106/75   Pulse 78   Wt (!) 306 lb 6.4 oz (139 kg)   BMI 46.59 kg/m  Last Weight:  Wt Readings from Last 1 Encounters:  09/10/17 (!) 306 lb 6.4 oz (139 kg)   Last Height:   Ht Readings from Last 1 Encounters:  04/21/17 5' 8"  (1.727 m)    Physical exam: Exam: Gen: NAD, conversant, well nourised, obese, well groomed                      Neuro: Detailed Neurologic Exam  Speech:    Speech is normal; fluent and spontaneous with normal comprehension.   Cranial Nerves:    The pupils are equal, round, and reactive to light.  Visual fields are full to finger confrontation. Extraocular movements are intact. Trigeminal sensation is intact and the muscles of mastication are normal. The face is symmetric. The palate elevates in the midline. Hearing intact. Voice is normal. Shoulder shrug is normal. The tongue has normal motion without fasciculations.   Motor Observation:    No asymmetry, no atrophy, and no involuntary movements noted. Tone:    Normal muscle tone.    Posture:    Posture is normal. normal  erect    Strength:    Strength is V/V in the upper and lower limbs.        Assessment/Plan: 41 year old female here as a referral from Dr. Lin Landsman for seizure-like activity and here today to follow up on migraine. She has a PMHx of morbid obesity, HTN, diabetes, HTN, depression with a suicide attempt, diabetic neuropathy, tobacco abuse, multiple pain medications. She was admitted to [facility] and she was diagnosed with seizure, migraine variant, syncope, withdrawal syndrome, hypoglycemic episode, conversion disorder. She also had hypoxia. Multiple EEGs including 72-hour eeg normal.   Seizure-like event: Patient is already on Neurontin 600 mg 4 times a day, benzodiazepines daily. She was started on Topamax at [facility]. Continue current medications, stable, no repeat events.  Migraine:   - Magnesium citrate 452m to 6036mdaily, riboflavin 4006mCoenzyme Q 10 100m53mree times daily  - Increase Topiramate to 200mg33me at night, do not get pregnant due to teratogenicy discussed, also use back up birth control as this can affect. Discussed increased risk of kidney stones, patient needs to stay hydrated, this can cause significant renal failure and even permanent kidney damage. Stop and seek help if you have any symptoms including abdominal pain, back pain or any significant symptoms, nausea vomiting.  - Ajovy ("cgrp migraine" google) discuss with rheumatology  - Discussed weight loss healthy weight and wellness [facility] [id number] [phone] of 25 minutes was spent face-to-face with this patient. Over half this time was spent on counseling patient on the chronic migraine diagnosis and different  diagnostic and therapeutic options available.

## 2017-09-11 NOTE — Progress Notes (Signed)
Office Visit Note  Patient: Lynn Ponce             Date of Birth: December 20, 1975           MRN: [id number]             PCP: Antonietta Jewel, MD Referring: Antonietta Jewel, MD Visit Date: 09/24/2017 Occupation: @GUAROCC @    Subjective:  Other (experiencing BIL foot and joint pain, increased fatigue )   History of Present Illness: Lynn Ponce is a 41 y.o. female with history of autoimmune disease. She also has history of gout. She has not had a gout flare in long time. She states she's been experiencing increased pain recently which she describes in her shoulders, bilateral knee joints in her bilateral feet. She notices some swelling in her feet.  Activities of Daily Living:  Patient reports morning stiffness for 30 minutes.   Patient Reports nocturnal pain.  Difficulty dressing/grooming: Reports Difficulty climbing stairs: Reports Difficulty getting out of chair: Reports Difficulty using hands for taps, buttons, cutlery, and/or writing: Denies   Review of Systems  Constitutional: Positive for fatigue. Negative for night sweats, weight gain, weight loss and weakness.  HENT: Positive for mouth sores and mouth dryness. Negative for trouble swallowing, trouble swallowing and nose dryness.   Eyes: Positive for dryness. Negative for pain, redness and visual disturbance.  Respiratory: Negative for cough, shortness of breath and difficulty breathing.   Cardiovascular: Negative for chest pain, palpitations, hypertension, irregular heartbeat and swelling in legs/feet.  Gastrointestinal: Negative for blood in stool, constipation and diarrhea.  Endocrine: Negative for increased urination.  Genitourinary: Negative for vaginal dryness.  Musculoskeletal: Positive for arthralgias, joint pain, myalgias, morning stiffness and myalgias. Negative for joint swelling, muscle weakness and muscle tenderness.  Skin: Positive for rash and sensitivity to sunlight. Negative for color change, hair loss, skin  tightness and ulcers.  Allergic/Immunologic: Negative for susceptible to infections.  Neurological: Negative for dizziness, memory loss and night sweats.  Hematological: Negative for swollen glands.  Psychiatric/Behavioral: Positive for depressed mood. Negative for sleep disturbance. The patient is not nervous/anxious.     PMFS History:  Patient Active Problem List   Diagnosis Date Noted  . Idiopathic chronic gout of left knee without tophus 03/22/2017  . Tobacco use 03/22/2017  . History of depression/ with past suicide attempt 03/22/2017  . History of IBS 03/22/2017  . History of diabetes mellitus 03/22/2017  . History of diabetic neuropathy 03/22/2017  . Hair thinning 03/21/2017  . History of kidney stones 03/21/2017  . History of asthma 03/21/2017  . History of migraine 03/21/2017  . Chronic migraine w/o aura w/o status migrainosus, not intractable 03/08/2016  . Alteration consciousness 11/08/2015  . Migraine 11/08/2015  . CELLULITIS/ABSCESS, ARM 07/15/2008  . HYPERTHYROIDISM 06/14/2008  . MORBID OBESITY 06/14/2008  . ANXIETY 06/14/2008  . MIGRAINE, CLASSICAL 06/14/2008  . ASTHMA 06/14/2008  . MENOPAUSE, SURGICAL 06/14/2008  . LYMPHADENOPATHY 06/14/2008  . Hx of cholecystectomy 06/14/2008    Past Medical History:  Diagnosis Date  . Anxiety   . Asthma   . Depression   . Diabetes mellitus without complication ([place])   . Hyperlipemia   . Hypertension   . Renal disorder     Family History  Problem Relation Age of Onset  . Asthma Son   . Asthma Daughter   . Seizures Neg Hx   . Migraines Neg Hx    Past Surgical History:  Procedure Laterality Date  . ADENOIDECTOMY    .  ANKLE SURGERY     x1  . CHOLECYSTECTOMY    . FOOT SURGERY     x4  . OTHER SURGICAL HISTORY     lap for ovaries x6  . TONSILLECTOMY    . VAGINAL HYSTERECTOMY     Social History   Social History Narrative   Lives at home alone    Right handed   Caffeine use: 1 cup per day (soda/coffee)      Objective: Vital Signs: BP 118/82 (BP Location: Left Arm, Patient Position: Sitting, Cuff Size: Normal)   Pulse 72   Resp 20   Ht 5' 8"  (1.727 m)   Wt 300 lb (136.1 kg)   BMI 45.61 kg/m    Physical Exam  Constitutional: She is oriented to person, place, and time. She appears well-developed and well-nourished.  HENT:  Head: Normocephalic and atraumatic.  Eyes: Conjunctivae and EOM are normal.  Neck: Normal range of motion.  Cardiovascular: Normal rate, regular rhythm, normal heart sounds and intact distal pulses.  Pulmonary/Chest: Effort normal and breath sounds normal.  Abdominal: Soft. Bowel sounds are normal.  Lymphadenopathy:    She has no cervical adenopathy.  Neurological: She is alert and oriented to person, place, and time.  Skin: Skin is warm and dry. Capillary refill takes less than 2 seconds.  Mild malar flush  Psychiatric: She has a normal mood and affect. Her behavior is normal.  Nursing note and vitals reviewed.    Musculoskeletal Exam: C-spine good range of motion. Shoulder joints discomfort with range of motion. Elbow joints wrist joint MCPs PIPs DIPs are good range of motion with no synovitis. She has discomfort range of motion of bilateral knee joint without any warmth swelling or effusion. She has tenderness across MTPs and ankle joints without any warmth swelling or effusion. She has several tender points on examination.  CDAI Exam: CDAI Homunculus Exam:   Tenderness:  RUE: glenohumeral LUE: glenohumeral RLE: tibiofemoral and tibiotalar LLE: tibiofemoral and tibiotalar Right foot: 1st MTP, 2nd MTP, 3rd MTP, 4th MTP and 5th MTP Left foot: 1st MTP, 2nd MTP, 3rd MTP, 4th MTP and 5th MTP  Joint Counts:  CDAI Tender Joint count: 4 CDAI Swollen Joint count: 0  Global Assessments:  Patient Global Assessment: 5 Provider Global Assessment: 5  CDAI Calculated Score: 14    Investigation: No additional findings.Uric Acid: 03/2017 5.8 CBC Latest Ref  Rng & Units 04/21/2017 12/13/2013 12/05/2009  WBC 4.0 - 10.5 K/uL 7.4 10.8(H) 10.3  Hemoglobin 12.0 - 15.0 g/dL 13.3 14.0 12.7  Hematocrit 36.0 - 46.0 % 40.5 40.9 37.0  Platelets 150 - 400 K/uL 203 252 180   CMP Latest Ref Rng & Units 04/21/2017 11/08/2015 12/13/2013  Glucose 65 - 99 mg/dL 120(H) 88 147(H)  BUN 6 - 20 mg/dL 13 16 19   Creatinine 0.44 - 1.00 mg/dL 1.36(H) 1.22(H) 1.39(H)  Sodium 135 - 145 mmol/L 137 145(H) 138  Potassium 3.5 - 5.1 mmol/L 4.1 5.0 5.0  Chloride 101 - 111 mmol/L 105 104 98  CO2 22 - 32 mmol/L 23 20 23   Calcium 8.9 - 10.3 mg/dL 8.9 9.3 9.7  Total Protein 6.0 - 8.5 g/dL - 7.4 -  Total Bilirubin 0.0 - 1.2 mg/dL - 0.2 -  Alkaline Phos 39 - 117 IU/L - 101 -  AST 0 - 40 IU/L - 14 -  ALT 0 - 32 IU/L - 14 -    Imaging: No results found.  Speciality Comments: No specialty comments available.  Procedures:  No procedures performed Allergies: Sulfonamide derivatives; Erythromycin; Ibuprofen; Mushroom extract complex; Toradol [ketorolac tromethamine]; and Azithromycin   Assessment / Plan:     Visit Diagnoses: Autoimmune disease ([place]) - +ANA1:320NH, ENA-, acl,LA,b2 negative , history of fatigue, dry mouth, oral ulcers, photosensitivity, arthralgias.  -patient was diagnosed in the past by  another rheumatologist. She's been on Plaquenil.she continues to have arthralgias with no synovitis. Her labs in May were unremarkable except for positive ANA. She believes that she is having a flare of lupus I will check the following labs today.I offered referral to a tertiary care center to confirm her diagnosis but she declined. Plan: C3 and C4, Anti-DNA antibody, double-stranded, Urinalysis, Routine w reflex microscopic  High risk medication use - Plaquenil 200 mg by mouth twice a day started by Dr.Zulkoska 2/18 - Plan: CBC with Differential/Platelet, COMPLETE METABOLIC PANEL WITH GFRtoday and then every 5 months. She's also not had a baseline eye exam. I will advised her to get  eye exam. Plaquenil eye exam form was given.  Pain knees: She had no warmth swelling or effusion in her knee joints.  Pain feet: I reviewed her prior x-rays which showed mild osteoarthritic changes. She had no synovitis on examination which she has remarkable tenderness.  Idiopathic chronic gout of left knee without tophus - [place] proven, on Uloric.She is doing quite well on combination of Uloric and colchicine.Uric Acid: 03/2017 5.8  Tobacco use: Smoking cessation was discussed.  Probable myofascial pain syndrome. She has generalized hyperalgesia and discomfort.  Her other medical problems listed as follows:   History of anxiety  History of depression - with past suicide attempt  History of diabetes mellitus  History of migraine  History of asthma  History of kidney stones  History of obesity  History of cholecystectomy  History of lymphadenopathy  History of IBS  History of diabetic neuropathy  Pain in both feet - Plan: Uric acid, Sedimentation rate    Orders: Orders Placed This Encounter  Procedures  . CBC with Differential/Platelet  . COMPLETE METABOLIC PANEL WITH GFR  . Uric acid  . Sedimentation rate  . C3 and C4  . Anti-DNA antibody, double-stranded  . Urinalysis, Routine w reflex microscopic   No orders of the defined types were placed in this encounter.   Face-to-face time spent with patient was 30 minutes.greater than 50% of time was spent in counseling and coordination of care.  Follow-Up Instructions: Return in about 5 months (around 02/22/2018) for Autoimmune disease, Gout.   Bo Merino, MD  Note - This record has been created using Editor, commissioning.  Chart creation errors have been sought, but may not always  have been located. Such creation errors do not reflect on  the standard of medical care.

## 2017-09-24 ENCOUNTER — Ambulatory Visit (INDEPENDENT_AMBULATORY_CARE_PROVIDER_SITE_OTHER): Payer: Medicare HMO | Admitting: Rheumatology

## 2017-09-24 ENCOUNTER — Encounter: Payer: Self-pay | Admitting: Rheumatology

## 2017-09-24 VITALS — BP 118/82 | HR 72 | Resp 20 | Ht 68.0 in | Wt 300.0 lb

## 2017-09-24 DIAGNOSIS — Z87898 Personal history of other specified conditions: Secondary | ICD-10-CM

## 2017-09-24 DIAGNOSIS — Z87442 Personal history of urinary calculi: Secondary | ICD-10-CM | POA: Diagnosis not present

## 2017-09-24 DIAGNOSIS — Z8709 Personal history of other diseases of the respiratory system: Secondary | ICD-10-CM | POA: Diagnosis not present

## 2017-09-24 DIAGNOSIS — Z8639 Personal history of other endocrine, nutritional and metabolic disease: Secondary | ICD-10-CM | POA: Diagnosis not present

## 2017-09-24 DIAGNOSIS — D8989 Other specified disorders involving the immune mechanism, not elsewhere classified: Secondary | ICD-10-CM | POA: Diagnosis not present

## 2017-09-24 DIAGNOSIS — M359 Systemic involvement of connective tissue, unspecified: Secondary | ICD-10-CM

## 2017-09-24 DIAGNOSIS — M1A0620 Idiopathic chronic gout, left knee, without tophus (tophi): Secondary | ICD-10-CM

## 2017-09-24 DIAGNOSIS — Z720 Tobacco use: Secondary | ICD-10-CM | POA: Diagnosis not present

## 2017-09-24 DIAGNOSIS — Z8659 Personal history of other mental and behavioral disorders: Secondary | ICD-10-CM

## 2017-09-24 DIAGNOSIS — Z8669 Personal history of other diseases of the nervous system and sense organs: Secondary | ICD-10-CM

## 2017-09-24 DIAGNOSIS — Z79899 Other long term (current) drug therapy: Secondary | ICD-10-CM

## 2017-09-24 DIAGNOSIS — Z9049 Acquired absence of other specified parts of digestive tract: Secondary | ICD-10-CM | POA: Diagnosis not present

## 2017-09-24 DIAGNOSIS — Z8719 Personal history of other diseases of the digestive system: Secondary | ICD-10-CM | POA: Diagnosis not present

## 2017-09-24 DIAGNOSIS — M79672 Pain in left foot: Secondary | ICD-10-CM

## 2017-09-24 DIAGNOSIS — M79671 Pain in right foot: Secondary | ICD-10-CM

## 2017-09-24 NOTE — Patient Instructions (Signed)
Standing Labs We placed an order today for your standing lab work.    Please come back and get your standing labs in April   We have open lab Monday through Friday from 8:30-11:30 AM and 1:30-4 PM at the office of Dr. Bo Merino.   The office is located at [address] [place], [place], [place] [address] No appointment is necessary.   Labs are drawn by [facility].  You may receive a bill from [facility] for your lab work. If you have any questions regarding directions or hours of operation,  please call [phone].

## 2017-09-25 LAB — CBC WITH DIFFERENTIAL/PLATELET
BASOS ABS: 70 {cells}/uL (ref 0–200)
Basophils Relative: 1 %
Eosinophils Absolute: 301 cells/uL (ref 15–500)
Eosinophils Relative: 4.3 %
HEMATOCRIT: 42.9 % (ref 35.0–45.0)
HEMOGLOBIN: 14.4 g/dL (ref 11.7–15.5)
LYMPHS ABS: 3311 {cells}/uL (ref 850–3900)
MCH: 29.1 pg (ref 27.0–33.0)
MCHC: 33.6 g/dL (ref 32.0–36.0)
MCV: 86.8 fL (ref 80.0–100.0)
MPV: 11.5 fL (ref 7.5–12.5)
Monocytes Relative: 7.3 %
NEUTROS ABS: 2807 {cells}/uL (ref 1500–7800)
NEUTROS PCT: 40.1 %
Platelets: 216 10*3/uL (ref 140–400)
RBC: 4.94 10*6/uL (ref 3.80–5.10)
RDW: 13 % (ref 11.0–15.0)
Total Lymphocyte: 47.3 %
WBC: 7 10*3/uL (ref 3.8–10.8)
WBCMIX: 511 {cells}/uL (ref 200–950)

## 2017-09-25 LAB — C3 AND C4
C3 COMPLEMENT: 175 mg/dL (ref 83–193)
C4 Complement: 39 mg/dL (ref 15–57)

## 2017-09-25 LAB — COMPLETE METABOLIC PANEL WITH GFR
AG Ratio: 1.6 (calc) (ref 1.0–2.5)
ALBUMIN MSPROF: 4.7 g/dL (ref 3.6–5.1)
ALT: 31 U/L — AB (ref 6–29)
AST: 32 U/L — ABNORMAL HIGH (ref 10–30)
Alkaline phosphatase (APISO): 74 U/L (ref 33–115)
BUN/Creatinine Ratio: 14 (calc) (ref 6–22)
BUN: 24 mg/dL (ref 7–25)
CALCIUM: 9.8 mg/dL (ref 8.6–10.2)
CO2: 25 mmol/L (ref 20–32)
Chloride: 103 mmol/L (ref 98–110)
Creat: 1.75 mg/dL — ABNORMAL HIGH (ref 0.50–1.10)
GFR, EST AFRICAN AMERICAN: 41 mL/min/{1.73_m2} — AB (ref >=60)
GFR, Est Non African American: 35 mL/min/{1.73_m2} — ABNORMAL LOW (ref >=60)
GLOBULIN: 3 g/dL (ref 1.9–3.7)
Glucose, Bld: 119 mg/dL — ABNORMAL HIGH (ref 65–99)
POTASSIUM: 4.6 mmol/L (ref 3.5–5.3)
Sodium: 139 mmol/L (ref 135–146)
TOTAL PROTEIN: 7.7 g/dL (ref 6.1–8.1)
Total Bilirubin: 0.6 mg/dL (ref 0.2–1.2)

## 2017-09-25 LAB — URINALYSIS, ROUTINE W REFLEX MICROSCOPIC
BILIRUBIN URINE: NEGATIVE
Glucose, UA: NEGATIVE
Hgb urine dipstick: NEGATIVE
KETONES UR: NEGATIVE
Leukocytes, UA: NEGATIVE
NITRITE: NEGATIVE
PH: 6 (ref 5.0–8.0)
Protein, ur: NEGATIVE
SPECIFIC GRAVITY, URINE: 1.009 (ref 1.001–1.03)

## 2017-09-25 LAB — URIC ACID: Uric Acid, Serum: 6.9 mg/dL (ref 2.5–7.0)

## 2017-09-25 LAB — SEDIMENTATION RATE: SED RATE: 11 mm/h (ref 0–20)

## 2017-09-25 LAB — ANTI-DNA ANTIBODY, DOUBLE-STRANDED: ds DNA Ab: <1 IU/mL

## 2017-09-25 NOTE — Progress Notes (Signed)
Not related to the meds prescribed by [place]. Probably related to Lasix and statins. Please notified patient and fax results to her PCP.

## 2017-10-07 ENCOUNTER — Other Ambulatory Visit: Payer: Self-pay | Admitting: Adult Health

## 2017-10-08 NOTE — Telephone Encounter (Signed)
Lynn Ponce can you call her and see what she is taking please?

## 2017-10-15 NOTE — Telephone Encounter (Signed)
Called patient. She is taking the Qudexy samples 200 mg at night. She would like a prescription for the medication instead of the Topamax which was previously prescribed.

## 2017-10-17 NOTE — Telephone Encounter (Signed)
That's fine please order thanks

## 2017-10-18 ENCOUNTER — Other Ambulatory Visit: Payer: Self-pay | Admitting: Adult Health

## 2017-10-18 MED ORDER — TOPIRAMATE 50 MG PO TABS: ORAL_TABLET | ORAL | 3 refills | Status: DC

## 2017-10-18 NOTE — Telephone Encounter (Signed)
Called patient and informed her that her Rx had not been refilled because it didn't agree with Dr Cathren Laine last office note, increase Topamax to 200 mg at night. Patient stated she was given Rx for Quedexy, another form of Topamax. She stated it worked well for her, but her pharmacy couldn't get it. This RN suggested she try other pharmacies. Patient stated she did not want to do that; she preferred to go back to Topamax. This RN stated a new Rx will be sent today.She verbalized understanding, appreciation of call.

## 2017-10-18 NOTE — Telephone Encounter (Signed)
[facility] [phone] request refill for topiramate (TOPAMAX) 50 MG tablet. Pt is out of this medication.

## 2017-11-18 ENCOUNTER — Other Ambulatory Visit: Payer: Self-pay | Admitting: Rheumatology

## 2017-11-18 NOTE — Telephone Encounter (Signed)
Last Visit: 09/24/17 Next Visit: 02/25/18 Labs: 09/24/17 Creat. Elevated at 1.75 GFR 35 AST 32 and ALT 31  Okay to refill per Dr. Estanislado Pandy

## 2017-12-03 ENCOUNTER — Other Ambulatory Visit: Payer: Self-pay | Admitting: Rheumatology

## 2017-12-03 NOTE — Telephone Encounter (Signed)
Elevated creatinine most likely related to diuretics. It is okay to refill  colchicine.

## 2017-12-03 NOTE — Telephone Encounter (Signed)
Last Visit: 09/24/17 Next Visit: 02/25/18 Labs: 09/24/17 Creat. Elevated at 1.75 GFR 35 AST 32 and ALT 31 Previous Creat 1.36 GFR 47 AST and ALT normal  Okay to refill Colchicine?

## 2018-01-14 ENCOUNTER — Telehealth: Payer: Self-pay | Admitting: Neurology

## 2018-01-14 NOTE — Telephone Encounter (Signed)
Pt called stating her insurance [employer] is asking for a PA  eletriptan (RELPAX) 40 MG tablet [facility]  Ask name: GLORIAJEAN OKUN  Member I.D.: [id number] Rx Vin # : [id number] Group #: [phone]

## 2018-01-16 NOTE — Telephone Encounter (Signed)
Received additional questions from [facility] via fax. Completed form and faxed to [facility] along with office notes. Received a receipt of confirmation.

## 2018-01-16 NOTE — Telephone Encounter (Signed)
Completed PA for Eletriptan 40 mg tablet on Cover My Meds.  KEY: [id number] Response expected within 72 hours from [facility].

## 2018-01-17 NOTE — Telephone Encounter (Addendum)
Received determination from [facility].   Eletriptan denied due to the following reasons:   Drug is not on formularly. Coverage only provided if patient has tried/failed at least one of the following: Rizatriptan or Rizatriptan ODT, Naratriptan, OR there is a specific medical reason(s) why the alternative medications are not appropriate to treat medical condition.   Appeal can also be requested.

## 2018-01-20 NOTE — Telephone Encounter (Signed)
Can you call and ask her if she has tried any of these? If ntot we can call in either. Let me know thanks

## 2018-01-21 ENCOUNTER — Other Ambulatory Visit: Payer: Self-pay | Admitting: Neurology

## 2018-01-21 MED ORDER — RIZATRIPTAN BENZOATE 10 MG PO TABS: 10.0000 mg | ORAL_TABLET | ORAL | 12 refills | Status: DC | PRN

## 2018-01-21 NOTE — Telephone Encounter (Signed)
Spoke with patient. She is aware that Rizatriptan has been called into her pharmacy. Discussed instructions with patient, Take 1 tablet at onset of migraine. May repeat in 2 hours if needed, but do not take more than 2 tablet in 24 hours. She verbalized understanding and will try the medication. She is also aware that she can go to [email or web address] and look for an affordable way to get her Rizatriptan.

## 2018-01-21 NOTE — Telephone Encounter (Signed)
Spoke with patient regarding insurance not approving Eletriptan. Patient stated she did not think she had previously tried Rizatriptan or Naratriptan. She is willing to try if necessary. Informed pt will d/w Dr. Jaynee Eagles and call her back.

## 2018-01-21 NOTE — Telephone Encounter (Signed)
I've called in the rizatriptan. If this does not work we can try again for the relpax. She can also go onto [facility] and see if any pharmacies sell the relpax very cheap out of pocket. thanks

## 2018-02-03 ENCOUNTER — Telehealth: Payer: Self-pay | Admitting: Rheumatology

## 2018-02-03 NOTE — Telephone Encounter (Signed)
Patient called for prescription refill of Plaquenil.  Patient's pharmacy is [facility] on [address].

## 2018-02-04 NOTE — Telephone Encounter (Addendum)
Last Visit: 09/24/17 Next Visit: 02/25/18 Labs: 11/6/18Creat. Elevated at 1.75 GFR 35 AST 32 and ALT 31 Previous Creat 1.36 GFR 47 AST and ALT normal Patient has not had a baseline PLQ Eye exam. Spoke with patient and she states she will call the eye doctor to get it set up.   Okay to refill 30 day supply PLQ?

## 2018-02-04 NOTE — Telephone Encounter (Signed)
Ok. Cr is high due to diuretics.

## 2018-02-05 MED ORDER — HYDROXYCHLOROQUINE SULFATE 200 MG PO TABS: 200.0000 mg | ORAL_TABLET | Freq: Two times a day (BID) | ORAL | 0 refills | Status: DC

## 2018-02-12 NOTE — Progress Notes (Signed)
Office Visit Note  Patient: Lynn Ponce             Date of Birth: 11/13/1976           MRN: [id number]             PCP: Antonietta Jewel, MD Referring: Antonietta Jewel, MD Visit Date: 02/25/2018 Occupation: @GUAROCC @    Subjective:  Right second PIP joint pain  History of Present Illness: Lynn Ponce is a 42 y.o. female with history of autoimmune disease and gout.  Patient states that she was taken off of Uloric by her nephrologist about 2 weeks ago.  She was started on allopurinol 300 mg once daily.  She has been taking colchicine 0.6 mg daily as well.  She states that prior to discontinuing Uloric she had not had any recent gout flares.  She states that about 3 days ago she developed pain in her right second PIP joint.  She states that the pain has become very severe today.  She states her this digit is very sensitive to touch.  She states that she feels she is having a gout flare in this finger.  She states it is warm red and swollen. She states that since December she has been diagnosed with bronchitis twice.  She states that she has been on prednisone twice.  She states that she has been off prednisone for 2-3 weeks.  She states while she was on prednisone she had no joint pain or joint swelling.  She states over the past 3 weeks she has been having increased joint pain.  She states that she periodically develops facial flushing and a rash on her face.  She continues to have photosensitivity.  She states that she also had a short period of time illness about 2 days where she had mouth sores that were painless.  She denies any sores in her mouth or nose at this time.  She denies any swollen lymph nodes.  She continues to use to have chronic fatigue.  She denies any recent fevers.  She continues to take Plaquenil 200 mg twice daily.   Activities of Daily Living:  Patient reports morning stiffness for 1 hour.   Patient Reports nocturnal pain.  Difficulty dressing/grooming: Reports Difficulty  climbing stairs: Reports Difficulty getting out of chair: Denies Difficulty using hands for taps, buttons, cutlery, and/or writing: Reports   Review of Systems  Constitutional: Positive for fatigue.  HENT: Positive for mouth dryness. Negative for mouth sores and nose dryness.   Eyes: Positive for dryness. Negative for pain and visual disturbance.  Respiratory: Negative for cough, hemoptysis and difficulty breathing.   Cardiovascular: Positive for swelling in legs/feet. Negative for chest pain, palpitations and hypertension.  Gastrointestinal: Negative for blood in stool, constipation and diarrhea.  Endocrine: Negative for increased urination.  Genitourinary: Negative for difficulty urinating and painful urination.  Musculoskeletal: Positive for arthralgias, joint pain, joint swelling, muscle weakness, morning stiffness and muscle tenderness. Negative for myalgias and myalgias.  Skin: Positive for rash. Negative for color change, pallor, hair loss, nodules/bumps, skin tightness, ulcers and sensitivity to sunlight.  Allergic/Immunologic: Negative for susceptible to infections.  Neurological: Negative for dizziness and headaches.  Hematological: Negative for bruising/bleeding tendency and swollen glands.  Psychiatric/Behavioral: Negative for depressed mood and sleep disturbance. The patient is not nervous/anxious.     PMFS History:  Patient Active Problem List   Diagnosis Date Noted  . Idiopathic chronic gout of left knee without tophus 03/22/2017  .  Tobacco use 03/22/2017  . History of depression/ with past suicide attempt 03/22/2017  . History of IBS 03/22/2017  . History of diabetes mellitus 03/22/2017  . History of diabetic neuropathy 03/22/2017  . Hair thinning 03/21/2017  . History of kidney stones 03/21/2017  . History of asthma 03/21/2017  . History of migraine 03/21/2017  . Chronic migraine w/o aura w/o status migrainosus, not intractable 03/08/2016  . Alteration  consciousness 11/08/2015  . Migraine 11/08/2015  . CELLULITIS/ABSCESS, ARM 07/15/2008  . HYPERTHYROIDISM 06/14/2008  . MORBID OBESITY 06/14/2008  . ANXIETY 06/14/2008  . MIGRAINE, CLASSICAL 06/14/2008  . ASTHMA 06/14/2008  . MENOPAUSE, SURGICAL 06/14/2008  . LYMPHADENOPATHY 06/14/2008  . Hx of cholecystectomy 06/14/2008    Past Medical History:  Diagnosis Date  . Anxiety   . Asthma   . Depression   . Diabetes mellitus [place] complication ([place])   . Hyperlipemia   . Hypertension   . Renal disorder     Family History  Problem Relation Age of Onset  . Asthma Son   . Asthma Daughter   . Seizures Neg Hx   . Migraines Neg Hx    Past Surgical History:  Procedure Laterality Date  . ADENOIDECTOMY    . ANKLE SURGERY     x1  . CHOLECYSTECTOMY    . FOOT SURGERY     x4  . OTHER SURGICAL HISTORY     lap for ovaries x6  . TONSILLECTOMY    . VAGINAL HYSTERECTOMY     Social History   Social History Narrative   Lives at home alone    Right handed   Caffeine use: 1 cup per day (soda/coffee)     Objective: Vital Signs: BP 112/75 (BP Location: Left Arm, Patient Position: Sitting, Cuff Size: Normal)   Pulse 84   Resp 16   Ht 5' 8"  (1.727 m)   Wt (!) 316 lb (143.3 kg)   BMI 48.05 kg/m    Physical Exam  Constitutional: She is oriented to person, place, and time. She appears well-developed and well-nourished.  HENT:  Head: Normocephalic and atraumatic.  Eyes: Conjunctivae and EOM are normal.  Neck: Normal range of motion.  Cardiovascular: Normal rate, regular rhythm, normal heart sounds and intact distal pulses.  Pulmonary/Chest: Effort normal and breath sounds normal.  Abdominal: Soft. Bowel sounds are normal.  Lymphadenopathy:    She has no cervical adenopathy.  Neurological: She is alert and oriented to person, place, and time.  Skin: Skin is warm and dry. Capillary refill takes less than 2 seconds.  Psychiatric: She has a normal mood and affect. Her behavior is  normal.  Nursing note and vitals reviewed.    Musculoskeletal Exam: C-spine, thoracic spine, lumbar spine good range of motion.  No midline spinal tenderness.  No SI joint tenderness.  Shoulder joints, elbow joints, wrist joints, MCPs, DIPs good range of motion with no synovitis.  No tenderness of MCP joints.  She has limited range of motion of her right second PIP joint.  She has warmth swelling erythema and tenderness of her right second PIP joint.  Hip joints, knee joints, ankle joints, MTPs, PIPs, DIPs good range of motion with no synovitis.  No warmth or effusion of bilateral knees.  No knee crepitus.  No tenderness in the ankle joint line.  No tenderness of the MTP joints.  No trochanteric bursa tenderness bilaterally.  CDAI Exam: No CDAI exam completed.    Investigation: No additional findings.Uric acid: 09/24/2017 6.9 CBC Latest Ref  Rng & Units 09/24/2017 04/21/2017 12/13/2013  WBC 3.8 - 10.8 Thousand/uL 7.0 7.4 10.8(H)  Hemoglobin 11.7 - 15.5 g/dL 14.4 13.3 14.0  Hematocrit 35.0 - 45.0 % 42.9 40.5 40.9  Platelets 140 - 400 Thousand/uL 216 203 252   CMP Latest Ref Rng & Units 09/24/2017 04/21/2017 11/08/2015  Glucose 65 - 99 mg/dL 119(H) 120(H) 88  BUN 7 - 25 mg/dL 24 13 16   Creatinine 0.50 - 1.10 mg/dL 1.75(H) 1.36(H) 1.22(H)  Sodium 135 - 146 mmol/L 139 137 145(H)  Potassium 3.5 - 5.3 mmol/L 4.6 4.1 5.0  Chloride 98 - 110 mmol/L 103 105 104  CO2 20 - 32 mmol/L 25 23 20   Calcium 8.6 - 10.2 mg/dL 9.8 8.9 9.3  Total Protein 6.1 - 8.1 g/dL 7.7 - 7.4  Total Bilirubin 0.2 - 1.2 mg/dL 0.6 - 0.2  Alkaline Phos 39 - 117 IU/L - - 101  AST 10 - 30 U/L 32(H) - 14  ALT 6 - 29 U/L 31(H) - 14    Imaging: No results found.  Speciality Comments: PLQ Eye Exam: 02/11/18 WNL @ [facility] Follow up in 6 months    Procedures:  No procedures performed Allergies: Sulfonamide derivatives; Erythromycin; Ibuprofen; Mushroom extract complex; Toradol [ketorolac tromethamine]; and  Azithromycin   Assessment / Plan:     Visit Diagnoses: Autoimmune disease ([place]) - +ANA1:320NH, ENA-, acl,LA,b2 negative , history of fatigue, dry mouth, oral ulcers, photosensitivity, arthralgias: She has been on and off prednisone due to being diagnosed  with bronchitis. She has been off prednisone for 2-3 weeks.  She states that while she was on prednisone she did not have any joint pain or joint swelling.  She recently had a 2-day period of time where she has painless oral ulcers  She has no oral or nasal ulcers noted on exam today.  She continues to have photosensitivity.  She reports recent facial flushing and a facial rash recently as well.  She has no cervical lymphadenopathy on exam today.  She continues to have sicca symptoms.  She will continue taking Plaquenil 200 mg twice daily.  We will recheck autoimmune labs today.    High risk medication use - Plaquenil 200 mg by mouth twice a day started by Dr.Zulkoska 2/18.  She had a Plaquenil eye exam at the end of March.  She is having her ophthamologist from [facility] send the results to [place].  We will check CBC and CMP today to monitor for drug toxicity.  Idiopathic chronic gout of left knee without tophus - [place].  She was taken off of Uloric 2 weeks ago by her nephrologist.  She was started on allopurinol 300 mg daily and continues to take colchicine 0.6 mg daily.  She is advised to discuss with nephrologist about increasing the dose of allopurinol.  Prior to switching from Uloric to colchicine she had not had any recent gout flares.  Her last uric acid level checked on 09/24/2017 and it was 6.9.  She presents today with right second PIP joint warmth, swelling, tenderness, and limited range of motion.  She was advised to continue taking allopurinol 300 mg daily colchicine 0.6 mg daily.  We will start her on a short course of prednisone.  A prescription for prednisone 20 mg tapering by 5 mg every 2 days will be sent to the pharmacy.  She was  advised to monitor her blood glucose level closely while she is taking the prednisone taper.  We will recheck uric acid today.  Other medical conditions are listed as follows:  Tobacco use  History of anxiety  History of diabetic neuropathy  History of depression - with past suicide attempt  History of IBS  History of diabetes mellitus  History of lymphadenopathy  History of migraine  History of asthma  History of cholecystectomy  History of kidney stones  History of obesity    Orders: Orders Placed This Encounter  Procedures  . CBC with Differential/Platelet  . COMPLETE METABOLIC PANEL WITH GFR  . Uric acid  . Urinalysis, Routine w reflex microscopic  . ANA  . Anti-DNA antibody, double-stranded  . Sedimentation rate  . C3 and C4   Meds ordered this encounter  Medications  . predniSONE (DELTASONE) 5 MG tablet    Sig: Take 4 tablets by mouth for 2 days, 3 tablets by mouth for 2 days, 2 tablets by mouth for 2 days, 1 tablet by mouth for 2 days.    Dispense:  20 tablet    Refill:  0    Face-to-face time spent with patient was 30 minutes.> 50% of time was spent in counseling and coordination of care.  Follow-Up Instructions: Return in about 6 months (around 08/27/2018) for Autoimmune Disease, Gout. Hazel Sams PA-C  I examined and evaluated the patient with Hazel Sams PA.  Patient had swollen hot and red right second PIP on my exam today.  She appears to be having a gout flare she was in severe pain.  I have given her a short course of prednisone.  I have also advised her to discuss this with her nephrologist so she can have increased dose of allopurinol.  She is concerned about the rash on her face.  We will obtain her autoimmune labs.  The plan of care was discussed as noted above.  Bo Merino, MD   Note - This record has been created using Editor, commissioning.  Chart creation errors have been sought, but may not always  have been located. Such creation  errors do not reflect on  the standard of medical care.

## 2018-02-25 ENCOUNTER — Ambulatory Visit (INDEPENDENT_AMBULATORY_CARE_PROVIDER_SITE_OTHER): Payer: Medicare Other | Admitting: Rheumatology

## 2018-02-25 ENCOUNTER — Encounter: Payer: Self-pay | Admitting: Rheumatology

## 2018-02-25 VITALS — BP 112/75 | HR 84 | Resp 16 | Ht 68.0 in | Wt 316.0 lb

## 2018-02-25 DIAGNOSIS — M359 Systemic involvement of connective tissue, unspecified: Secondary | ICD-10-CM

## 2018-02-25 DIAGNOSIS — Z8719 Personal history of other diseases of the digestive system: Secondary | ICD-10-CM

## 2018-02-25 DIAGNOSIS — D8989 Other specified disorders involving the immune mechanism, not elsewhere classified: Secondary | ICD-10-CM | POA: Diagnosis not present

## 2018-02-25 DIAGNOSIS — Z8669 Personal history of other diseases of the nervous system and sense organs: Secondary | ICD-10-CM

## 2018-02-25 DIAGNOSIS — Z8639 Personal history of other endocrine, nutritional and metabolic disease: Secondary | ICD-10-CM | POA: Diagnosis not present

## 2018-02-25 DIAGNOSIS — Z8709 Personal history of other diseases of the respiratory system: Secondary | ICD-10-CM | POA: Diagnosis not present

## 2018-02-25 DIAGNOSIS — Z8659 Personal history of other mental and behavioral disorders: Secondary | ICD-10-CM

## 2018-02-25 DIAGNOSIS — Z720 Tobacco use: Secondary | ICD-10-CM

## 2018-02-25 DIAGNOSIS — Z9049 Acquired absence of other specified parts of digestive tract: Secondary | ICD-10-CM | POA: Diagnosis not present

## 2018-02-25 DIAGNOSIS — M1A0620 Idiopathic chronic gout, left knee, without tophus (tophi): Secondary | ICD-10-CM

## 2018-02-25 DIAGNOSIS — Z79899 Other long term (current) drug therapy: Secondary | ICD-10-CM | POA: Diagnosis not present

## 2018-02-25 DIAGNOSIS — Z87442 Personal history of urinary calculi: Secondary | ICD-10-CM | POA: Diagnosis not present

## 2018-02-25 DIAGNOSIS — Z87898 Personal history of other specified conditions: Secondary | ICD-10-CM | POA: Diagnosis not present

## 2018-02-25 MED ORDER — PREDNISONE 5 MG PO TABS: ORAL_TABLET | ORAL | 0 refills | Status: DC

## 2018-02-27 LAB — COMPLETE METABOLIC PANEL WITH GFR
AG Ratio: 2 (calc) (ref 1.0–2.5)
ALKALINE PHOSPHATASE (APISO): 74 U/L (ref 33–115)
ALT: 31 U/L — AB (ref 6–29)
AST: 39 U/L — AB (ref 10–30)
Albumin: 4.8 g/dL (ref 3.6–5.1)
BUN/Creatinine Ratio: 14 (calc) (ref 6–22)
BUN: 17 mg/dL (ref 7–25)
CO2: 24 mmol/L (ref 20–32)
CREATININE: 1.25 mg/dL — AB (ref 0.50–1.10)
Calcium: 9.5 mg/dL (ref 8.6–10.2)
Chloride: 105 mmol/L (ref 98–110)
GFR, Est African American: 61 mL/min/{1.73_m2} (ref >=60)
GFR, Est Non African American: 53 mL/min/{1.73_m2} — ABNORMAL LOW (ref >=60)
GLOBULIN: 2.4 g/dL (ref 1.9–3.7)
GLUCOSE: 231 mg/dL — AB (ref 65–99)
Potassium: 4.4 mmol/L (ref 3.5–5.3)
SODIUM: 139 mmol/L (ref 135–146)
Total Bilirubin: 0.4 mg/dL (ref 0.2–1.2)
Total Protein: 7.2 g/dL (ref 6.1–8.1)

## 2018-02-27 LAB — CBC WITH DIFFERENTIAL/PLATELET
BASOS ABS: 80 {cells}/uL (ref 0–200)
Basophils Relative: 0.8 %
EOS PCT: 2.4 %
Eosinophils Absolute: 240 cells/uL (ref 15–500)
HCT: 40.9 % (ref 35.0–45.0)
Hemoglobin: 14.3 g/dL (ref 11.7–15.5)
Lymphs Abs: 2850 cells/uL (ref 850–3900)
MCH: 30.6 pg (ref 27.0–33.0)
MCHC: 35 g/dL (ref 32.0–36.0)
MCV: 87.6 fL (ref 80.0–100.0)
MONOS PCT: 4.8 %
MPV: 10.2 fL (ref 7.5–12.5)
NEUTROS PCT: 63.5 %
Neutro Abs: 6350 cells/uL (ref 1500–7800)
PLATELETS: 257 10*3/uL (ref 140–400)
RBC: 4.67 10*6/uL (ref 3.80–5.10)
RDW: 13.7 % (ref 11.0–15.0)
TOTAL LYMPHOCYTE: 28.5 %
WBC mixed population: 480 cells/uL (ref 200–950)
WBC: 10 10*3/uL (ref 3.8–10.8)

## 2018-02-27 LAB — ANTI-NUCLEAR AB-TITER (ANA TITER)

## 2018-02-27 LAB — URINALYSIS, ROUTINE W REFLEX MICROSCOPIC
BILIRUBIN URINE: NEGATIVE
Bacteria, UA: NONE SEEN /HPF
Glucose, UA: NEGATIVE
Hgb urine dipstick: NEGATIVE
KETONES UR: NEGATIVE
LEUKOCYTES UA: NEGATIVE
NITRITE: NEGATIVE
PH: 5.5 (ref 5.0–8.0)
SPECIFIC GRAVITY, URINE: 1.026 (ref 1.001–1.03)

## 2018-02-27 LAB — URIC ACID: URIC ACID, SERUM: 8 mg/dL — AB (ref 2.5–7.0)

## 2018-02-27 LAB — C3 AND C4
C3 Complement: 214 mg/dL — ABNORMAL HIGH (ref 83–193)
C4 Complement: 44 mg/dL (ref 15–57)

## 2018-02-27 LAB — SEDIMENTATION RATE: SED RATE: 28 mm/h — AB (ref 0–20)

## 2018-02-27 LAB — ANA: ANA: POSITIVE — AB

## 2018-02-27 LAB — ANTI-DNA ANTIBODY, DOUBLE-STRANDED

## 2018-02-28 NOTE — Progress Notes (Signed)
Uric acid elevated (8).  She was recently switched from Uloric to Allopurinol.  Please advise her to continue on Allopurinol.  Sed rate elevated likely due to gout flare.  She was started on Prednisone at her last visit.   Glucose elevated.  Please advise her to follow blood glucose level closely while on Prednisone.   Creatinine and GFR are improving.  CBC WNL. AST and ALT stable.  Please advise her to continue to avoid alcohol, tylenol, and NSAIDs.  We will continue to monitor.  Autoimmune labs due not reveal a flare.

## 2018-03-29 ENCOUNTER — Other Ambulatory Visit: Payer: Self-pay | Admitting: Rheumatology

## 2018-03-31 NOTE — Telephone Encounter (Signed)
Last Visit: 02/25/18 Next visit: 07/30/18 Labs: 02/25/18 Creatinine and GFR are improving. CBC WNL. AST and ALT stable.  PLQ Eye Exam: 02/11/18 WNL  Okay to refill per Dr. Estanislado Pandy

## 2018-05-07 ENCOUNTER — Other Ambulatory Visit: Payer: Self-pay | Admitting: Rheumatology

## 2018-05-07 NOTE — Telephone Encounter (Signed)
Last Visit: 02/25/18 Next visit: 07/30/18 Labs: 02/25/18 Creatinine and GFR are improving. CBC WNL. AST and ALT stable.   Okay to refill per Dr. Estanislado Pandy

## 2018-05-29 ENCOUNTER — Other Ambulatory Visit: Payer: Self-pay | Admitting: Adult Health

## 2018-05-29 NOTE — Telephone Encounter (Signed)
Note to pharmacy re: patient needs to schedule follow up.

## 2018-06-06 ENCOUNTER — Other Ambulatory Visit: Payer: Self-pay | Admitting: Adult Health

## 2018-06-09 ENCOUNTER — Telehealth: Payer: Self-pay | Admitting: Neurology

## 2018-06-09 NOTE — Telephone Encounter (Signed)
Refilled disp #[id number] with note to pharmacy, re: have patient schedule follow up.

## 2018-06-09 NOTE — Telephone Encounter (Signed)
Patient forgot her migraine meds while [place] the beach.   Refilled maxalt and phenergan on [address].

## 2018-06-09 NOTE — Telephone Encounter (Signed)
fyi

## 2018-07-16 ENCOUNTER — Other Ambulatory Visit: Payer: Self-pay | Admitting: Rheumatology

## 2018-07-16 NOTE — Telephone Encounter (Signed)
Last Visit: 02/25/18 Next visit: 07/30/18 Labs: 4/9/19Creatinine and GFR are improving. CBC WNL. AST and ALT stable.  PLQ Eye Exam: 02/11/18 WNL   Okay to refill per Dr. Estanislado Pandy

## 2018-07-16 NOTE — Progress Notes (Signed)
Office Visit Note  Patient: Lynn Ponce             Date of Birth: 1976-01-13           MRN: [id number]             PCP: Antonietta Jewel, MD Referring: Antonietta Jewel, MD Visit Date: 07/30/2018 Occupation: @GUAROCC @  Subjective:  Her back pain.   History of Present Illness: Lynn Ponce is a 42 y.o. female with history of autoimmune disease, gout and osteoarthritis.  She states she has been having increased lower back pain recently.  She was seen at [facility] where she had x-ray of the lumbar spine and she was told that she has degenerative disc disease of lumbar spine.  She continues to have some lower back pain.  She was referred to a back specialist.  She denies having any gout flares.  She continues to have sicca symptoms.  Reports due to black box warning she was switched from Uloric to allopurinol.  Activities of Daily Living:  Patient reports morning stiffness for 2 hours.   Patient Reports nocturnal pain.  Difficulty dressing/grooming: Reports Difficulty climbing stairs: Reports Difficulty getting out of chair: Reports Difficulty using hands for taps, buttons, cutlery, and/or writing: Reports  Review of Systems  Constitutional: Positive for fatigue. Negative for night sweats, weight gain and weight loss.  HENT: Positive for mouth sores and mouth dryness. Negative for trouble swallowing, trouble swallowing and nose dryness.   Eyes: Positive for dryness. Negative for pain, redness and visual disturbance.  Respiratory: Negative for cough, shortness of breath and difficulty breathing.   Cardiovascular: Negative for chest pain, palpitations, hypertension, irregular heartbeat and swelling in legs/feet.  Gastrointestinal: Negative for abdominal pain, blood in stool, constipation and diarrhea.  Endocrine: Negative for increased urination.  Genitourinary: Negative for painful urination, pelvic pain and vaginal dryness.  Musculoskeletal: Positive for arthralgias, joint pain  and morning stiffness. Negative for joint swelling, myalgias, muscle weakness, muscle tenderness and myalgias.  Skin: Negative for color change, rash, hair loss, skin tightness, ulcers and sensitivity to sunlight.  Allergic/Immunologic: Positive for susceptible to infections.  Neurological: Negative for dizziness, numbness, headaches, memory loss, night sweats and weakness.  Hematological: Negative for bruising/bleeding tendency and swollen glands.  Psychiatric/Behavioral: Negative for depressed mood, confusion and sleep disturbance. The patient is not nervous/anxious.     PMFS History:  Patient Active Problem List   Diagnosis Date Noted  . Idiopathic chronic gout of left knee without tophus 03/22/2017  . Tobacco use 03/22/2017  . History of depression/ with past suicide attempt 03/22/2017  . History of IBS 03/22/2017  . History of diabetes mellitus 03/22/2017  . History of diabetic neuropathy 03/22/2017  . Hair thinning 03/21/2017  . History of kidney stones 03/21/2017  . History of asthma 03/21/2017  . History of migraine 03/21/2017  . Chronic migraine w/o aura w/o status migrainosus, not intractable 03/08/2016  . Alteration consciousness 11/08/2015  . Migraine 11/08/2015  . CELLULITIS/ABSCESS, ARM 07/15/2008  . HYPERTHYROIDISM 06/14/2008  . MORBID OBESITY 06/14/2008  . ANXIETY 06/14/2008  . MIGRAINE, CLASSICAL 06/14/2008  . ASTHMA 06/14/2008  . MENOPAUSE, SURGICAL 06/14/2008  . LYMPHADENOPATHY 06/14/2008  . Hx of cholecystectomy 06/14/2008    Past Medical History:  Diagnosis Date  . Anxiety   . Asthma   . Depression   . Diabetes mellitus without complication ([place])   . Hyperlipemia   . Hypertension   . Renal disorder     Family History  Problem Relation Age of Onset  . Asthma Son   . Asthma Daughter   . Seizures Neg Hx   . Migraines Neg Hx    Past Surgical History:  Procedure Laterality Date  . ADENOIDECTOMY    . ANKLE SURGERY     x1  . CHOLECYSTECTOMY    .  FOOT SURGERY     x4  . OTHER SURGICAL HISTORY     lap for ovaries x6  . TONSILLECTOMY    . VAGINAL HYSTERECTOMY     Social History   Social History Narrative   Lives at home alone    Right handed   Caffeine use: 1 cup per day (soda/coffee)    Objective: Vital Signs: BP 111/79 (BP Location: Left Arm, Patient Position: Sitting, Cuff Size: Large)   Pulse 93   Resp 16   Ht 5' 8"  (1.727 m)   Wt (!) 306 lb (138.8 kg)   BMI 46.53 kg/m    Physical Exam  Constitutional: She is oriented to person, place, and time. She appears well-developed and well-nourished.  HENT:  Head: Normocephalic and atraumatic.  No oral ulcers were noted.  She wears dentures.  Eyes: Conjunctivae and EOM are normal.  Neck: Normal range of motion.  Cardiovascular: Normal rate, regular rhythm, normal heart sounds and intact distal pulses.  Pulmonary/Chest: Effort normal and breath sounds normal.  Abdominal: Soft. Bowel sounds are normal.  Lymphadenopathy:    She has no cervical adenopathy.  Neurological: She is alert and oriented to person, place, and time.  Skin: Skin is warm and dry. Capillary refill takes less than 2 seconds.  Psychiatric: She has a normal mood and affect. Her behavior is normal.  Nursing note and vitals reviewed.    Musculoskeletal Exam: Spine thoracic lumbar spine good range of motion.  Shoulder joints elbow joints wrist joint MCPs PIPs DIPs were in good range of motion with no synovitis.  Hip joints knee joints ankles MTPs PIPs were in good range of motion with no synovitis.  CDAI Exam: CDAI Score: Not documented Patient Global Assessment: Not documented; Provider Global Assessment: Not documented Swollen: Not documented; Tender: Not documented Joint Exam   Not documented   There is currently no information documented on the homunculus. Go to the Rheumatology activity and complete the homunculus joint exam.  Investigation: No additional findings.  Imaging: No results  found.  Recent Labs: Lab Results  Component Value Date   WBC 10.0 02/25/2018   HGB 14.3 02/25/2018   PLT 257 02/25/2018   NA 139 02/25/2018   K 4.4 02/25/2018   CL 105 02/25/2018   CO2 24 02/25/2018   GLUCOSE 231 (H) 02/25/2018   BUN 17 02/25/2018   CREATININE 1.25 (H) 02/25/2018   BILITOT 0.4 02/25/2018   ALKPHOS 101 11/08/2015   AST 39 (H) 02/25/2018   ALT 31 (H) 02/25/2018   PROT 7.2 02/25/2018   ALBUMIN 4.4 11/08/2015   CALCIUM 9.5 02/25/2018   GFRAA 61 02/25/2018  Uric acid 8.0, C3-C4 normal, ANA 1: 160, dsDNA negative, ESR 28, UA with trace protein  Speciality Comments: PLQ Eye Exam: 02/11/18 WNL @ [facility] Follow up in 6 months  Procedures:  No procedures performed Allergies: Sulfonamide derivatives; Erythromycin; Ibuprofen; Mushroom extract complex; Toradol [ketorolac tromethamine]; and Azithromycin   Assessment / Plan:     Visit Diagnoses: Autoimmune disease ([place]) - +ANA1:320NH, ENA-, acl,LA,b2 negative , history of fatigue, dry mouth, oral ulcers, photosensitivity, arthralgias.  Patient has been on Plaquenil and  still continues to have sicca symptoms.  She wears dentures.  She also reports oral ulcers which I did not notice today.  There was no synovitis on examination.  High risk medication use - Plaquenil 200 mg by mouth twice a day started by Dr.Zulkoska 2/18. PLQ Eye Exam: 02/11/18  Idiopathic chronic gout of left knee without tophus - [place].  She was taken off of Uloric by her nephrologist.  She was started on allopurinol 300 mg daily and continues to take colchicine 0.6 daily.  Her uric acid in April was elevated.  We will check uric acid again today.  DDD lumbar spine-she was recently diagnosed with disc disease of lumbar spine.  She has been having increased lower back pain.  She is taking Flexeril which has been helpful.  Weight loss diet and exercise was discussed.  A handout on back exercises was given.  History of kidney stones-she  denies any recent episodes of kidney stones.  History of asthma  History of lymphadenopathy-patient had no lymphadenopathy on examination today.  History of diabetes mellitus  History of depression - with past suicide attempt  History of IBS  History of anxiety  History of diabetic neuropathy  History of migraine  History of cholecystectomy  History of obesity  Tobacco use   Orders: Orders Placed This Encounter  Procedures  . CBC with Differential/Platelet  . COMPLETE METABOLIC PANEL WITH GFR  . Uric acid   No orders of the defined types were placed in this encounter.   Face-to-face time spent with patient was 30 minutes. Greater than 50% of time was spent in counseling and coordination of care.  Follow-Up Instructions: Return in about 5 months (around 12/30/2018) for Autoimmune disease, Osteoarthritis, Gout.   Bo Merino, MD  Note - This record has been created using Editor, commissioning.  Chart creation errors have been sought, but may not always  have been located. Such creation errors do not reflect on  the standard of medical care.

## 2018-07-30 ENCOUNTER — Ambulatory Visit (INDEPENDENT_AMBULATORY_CARE_PROVIDER_SITE_OTHER): Payer: Medicare Other | Admitting: Rheumatology

## 2018-07-30 ENCOUNTER — Encounter: Payer: Self-pay | Admitting: Rheumatology

## 2018-07-30 VITALS — BP 111/79 | HR 93 | Resp 16 | Ht 68.0 in | Wt 306.0 lb

## 2018-07-30 DIAGNOSIS — D8989 Other specified disorders involving the immune mechanism, not elsewhere classified: Secondary | ICD-10-CM

## 2018-07-30 DIAGNOSIS — Z87898 Personal history of other specified conditions: Secondary | ICD-10-CM

## 2018-07-30 DIAGNOSIS — Z87442 Personal history of urinary calculi: Secondary | ICD-10-CM

## 2018-07-30 DIAGNOSIS — Z8659 Personal history of other mental and behavioral disorders: Secondary | ICD-10-CM

## 2018-07-30 DIAGNOSIS — M1A0620 Idiopathic chronic gout, left knee, without tophus (tophi): Secondary | ICD-10-CM

## 2018-07-30 DIAGNOSIS — Z8639 Personal history of other endocrine, nutritional and metabolic disease: Secondary | ICD-10-CM

## 2018-07-30 DIAGNOSIS — Z79899 Other long term (current) drug therapy: Secondary | ICD-10-CM | POA: Diagnosis not present

## 2018-07-30 DIAGNOSIS — M359 Systemic involvement of connective tissue, unspecified: Secondary | ICD-10-CM

## 2018-07-30 DIAGNOSIS — Z8719 Personal history of other diseases of the digestive system: Secondary | ICD-10-CM

## 2018-07-30 DIAGNOSIS — M5136 Other intervertebral disc degeneration, lumbar region: Secondary | ICD-10-CM | POA: Diagnosis not present

## 2018-07-30 DIAGNOSIS — Z8709 Personal history of other diseases of the respiratory system: Secondary | ICD-10-CM

## 2018-07-30 DIAGNOSIS — Z8669 Personal history of other diseases of the nervous system and sense organs: Secondary | ICD-10-CM

## 2018-07-30 DIAGNOSIS — Z9049 Acquired absence of other specified parts of digestive tract: Secondary | ICD-10-CM

## 2018-07-30 DIAGNOSIS — Z720 Tobacco use: Secondary | ICD-10-CM

## 2018-07-30 NOTE — Patient Instructions (Signed)

## 2018-07-31 LAB — CBC WITH DIFFERENTIAL/PLATELET
BASOS PCT: 1.1 %
Basophils Absolute: 84 cells/uL (ref 0–200)
Eosinophils Absolute: 296 cells/uL (ref 15–500)
Eosinophils Relative: 3.9 %
HEMATOCRIT: 41 % (ref 35.0–45.0)
Hemoglobin: 14.1 g/dL (ref 11.7–15.5)
LYMPHS ABS: 2508 {cells}/uL (ref 850–3900)
MCH: 30.1 pg (ref 27.0–33.0)
MCHC: 34.4 g/dL (ref 32.0–36.0)
MCV: 87.4 fL (ref 80.0–100.0)
MPV: 10.8 fL (ref 7.5–12.5)
Monocytes Relative: 5.3 %
NEUTROS ABS: 4309 {cells}/uL (ref 1500–7800)
Neutrophils Relative %: 56.7 %
PLATELETS: 216 10*3/uL (ref 140–400)
RBC: 4.69 10*6/uL (ref 3.80–5.10)
RDW: 13.3 % (ref 11.0–15.0)
Total Lymphocyte: 33 %
WBC: 7.6 10*3/uL (ref 3.8–10.8)
WBCMIX: 403 {cells}/uL (ref 200–950)

## 2018-07-31 LAB — COMPLETE METABOLIC PANEL WITH GFR
AG RATIO: 2 (calc) (ref 1.0–2.5)
ALT: 29 U/L (ref 6–29)
AST: 37 U/L — ABNORMAL HIGH (ref 10–30)
Albumin: 4.9 g/dL (ref 3.6–5.1)
Alkaline phosphatase (APISO): 67 U/L (ref 33–115)
BUN/Creatinine Ratio: 11 (calc) (ref 6–22)
BUN: 17 mg/dL (ref 7–25)
CALCIUM: 9.7 mg/dL (ref 8.6–10.2)
CO2: 23 mmol/L (ref 20–32)
Chloride: 103 mmol/L (ref 98–110)
Creat: 1.5 mg/dL — ABNORMAL HIGH (ref 0.50–1.10)
GFR, EST AFRICAN AMERICAN: 49 mL/min/{1.73_m2} — AB (ref >=60)
GFR, EST NON AFRICAN AMERICAN: 42 mL/min/{1.73_m2} — AB (ref >=60)
Globulin: 2.5 g/dL (calc) (ref 1.9–3.7)
Glucose, Bld: 125 mg/dL — ABNORMAL HIGH (ref 65–99)
POTASSIUM: 4.6 mmol/L (ref 3.5–5.3)
Sodium: 139 mmol/L (ref 135–146)
TOTAL PROTEIN: 7.4 g/dL (ref 6.1–8.1)
Total Bilirubin: 0.3 mg/dL (ref 0.2–1.2)

## 2018-07-31 LAB — URIC ACID: Uric Acid, Serum: 5.2 mg/dL (ref 2.5–7.0)

## 2018-07-31 NOTE — Progress Notes (Signed)
Labs are stable.  Uric acid is in desirable range now.

## 2018-08-04 ENCOUNTER — Telehealth: Payer: Self-pay | Admitting: Rheumatology

## 2018-08-04 NOTE — Telephone Encounter (Signed)
Patient left a voicemail stating she had updated medical information that she wanted to give to [facility] directly.  Patient requested a return call.

## 2018-08-05 NOTE — Telephone Encounter (Signed)
Patient advised she may hold PLQ until infection has resolved. Patient verbalized understanding.

## 2018-08-05 NOTE — Telephone Encounter (Signed)
Patient states she is having cellulitis from a boil on her right breast. Patient was on 9 days of Cipro and that did not help and she is currently on a new antibiotics. Patient will be on the new antibiotic for 2 weeks. Patient is given Colchicine and PLQ from [place].

## 2018-08-05 NOTE — Telephone Encounter (Signed)
She may hold Plaquenil until the  infection is resolved.

## 2018-08-19 DIAGNOSIS — L0390 Cellulitis, unspecified: Secondary | ICD-10-CM

## 2018-08-19 DIAGNOSIS — A419 Sepsis, unspecified organism: Secondary | ICD-10-CM

## 2018-08-19 HISTORY — DX: Cellulitis, unspecified: L03.90

## 2018-08-19 HISTORY — DX: Sepsis, unspecified organism: A41.9

## 2018-08-26 ENCOUNTER — Other Ambulatory Visit: Payer: Self-pay | Admitting: *Deleted

## 2018-08-26 MED ORDER — COLCHICINE 0.6 MG PO CAPS: ORAL_CAPSULE | ORAL | 2 refills | Status: DC

## 2018-08-26 NOTE — Telephone Encounter (Signed)
Refill   Last Visit: 07/30/18 Next visit: 12/30/18 Labs: 07/30/18 Labs are stable. Uric acid is in desirable range now.  Okay to refill per Dr. Estanislado Pandy

## 2018-08-27 ENCOUNTER — Telehealth: Payer: Self-pay | Admitting: Rheumatology

## 2018-08-27 NOTE — Telephone Encounter (Signed)
Left message to advise patient we have noted the change in pharmacies. Patient advised to contact the office if she is in need on any refills.

## 2018-08-27 NOTE — Telephone Encounter (Signed)
Patient called to let our office know that she has changed pharmacies and needs all her prescription refills to go to [facility].

## 2018-08-30 IMAGING — CT CT RENAL STONE PROTOCOL
2 of 4 series · 16 of 46 positions shown, 18 images · non-contrast
Comparison: 04/03/2017 CT

CLINICAL DATA: 41-year-old female with left abdominal, pelvic and
flank pain today.

EXAM:
CT ABDOMEN AND PELVIS WITHOUT CONTRAST
TECHNIQUE: Multidetector CT imaging of the abdomen and pelvis was performed
following the standard protocol without IV contrast.

[Series 3: stone study 5.0 i30f 2 · axial · 0.98mm/px · z∈[+723,+1203]mm · 13 of 106 slices shown, 15 images]
[im 5/106  soft-tissue]
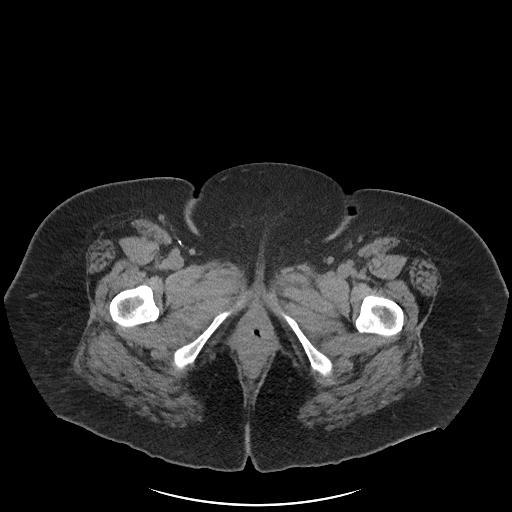
[im 5/106  bone]
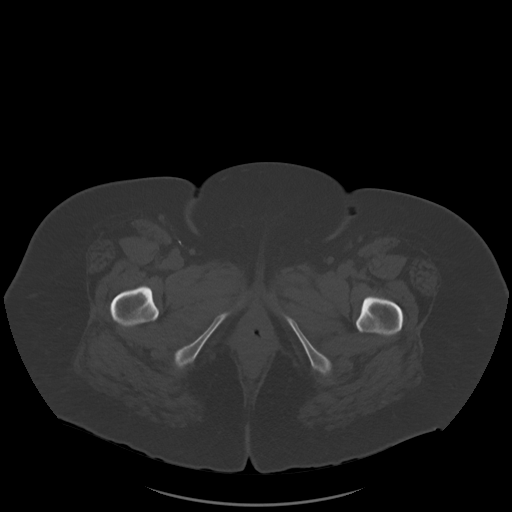
[im 14/106  soft-tissue]
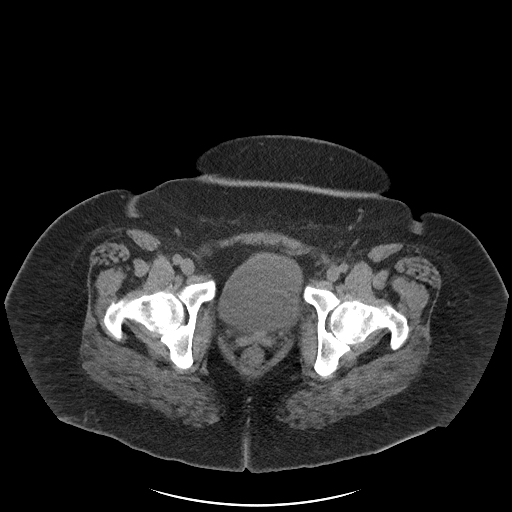
[im 22/106  soft-tissue]
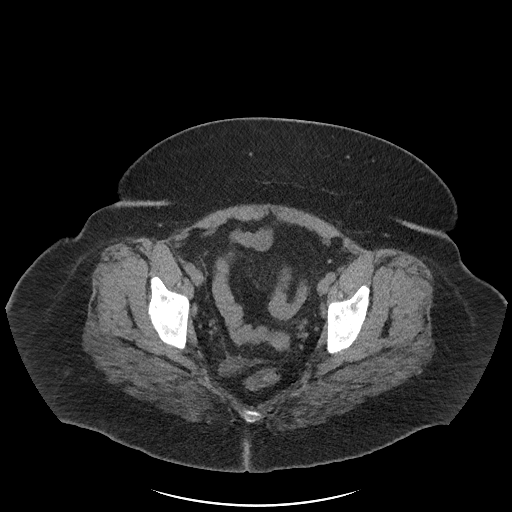
[im 31/106  soft-tissue]
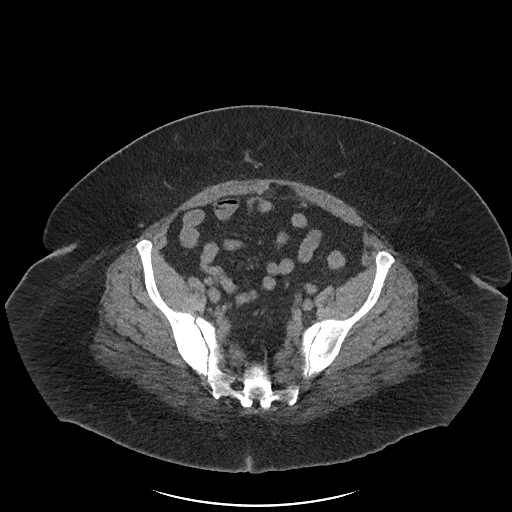
[im 36/106  soft-tissue]
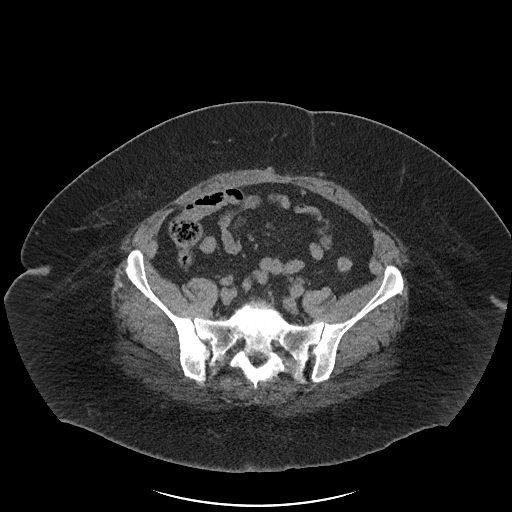
[im 44/106  soft-tissue]
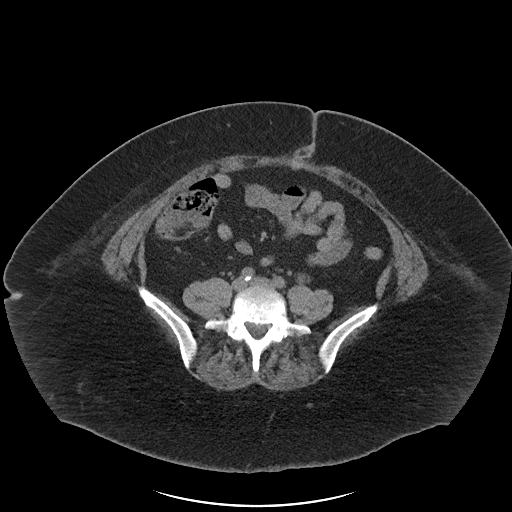
[im 53/106  soft-tissue]
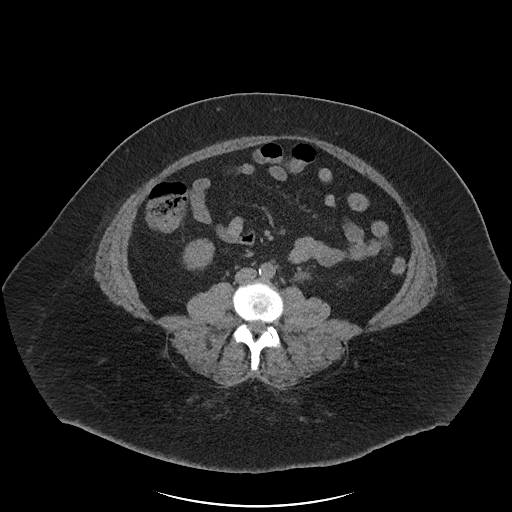
[im 62/106  soft-tissue]
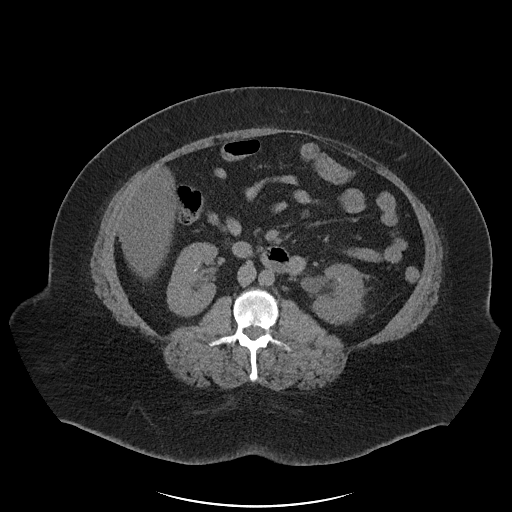
[im 71/106  soft-tissue]
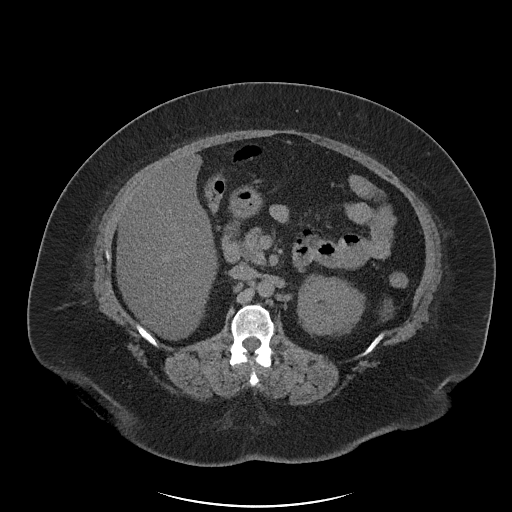
[im 71/106  bone]
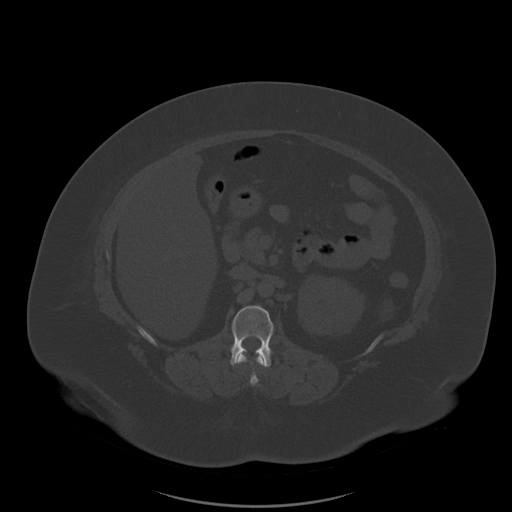
[im 75/106  soft-tissue]
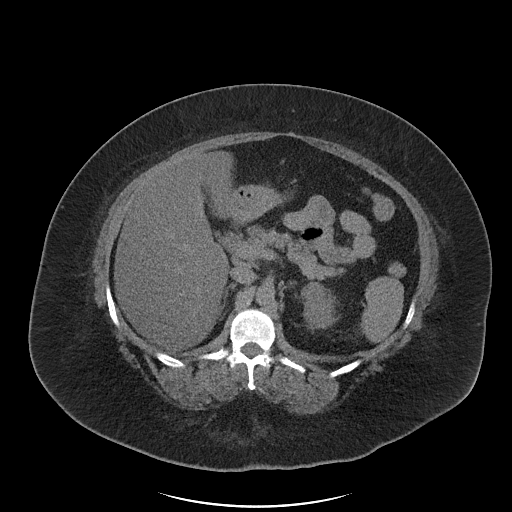
[im 84/106  soft-tissue]
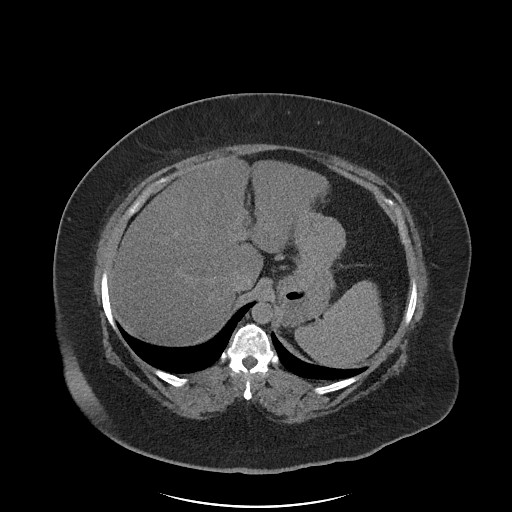
[im 92/106  soft-tissue]
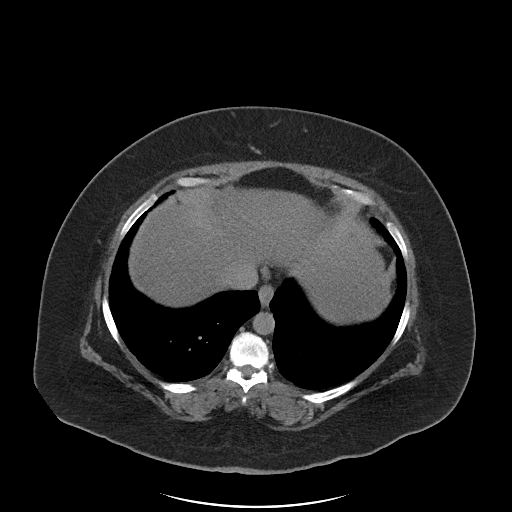
[im 101/106  soft-tissue]
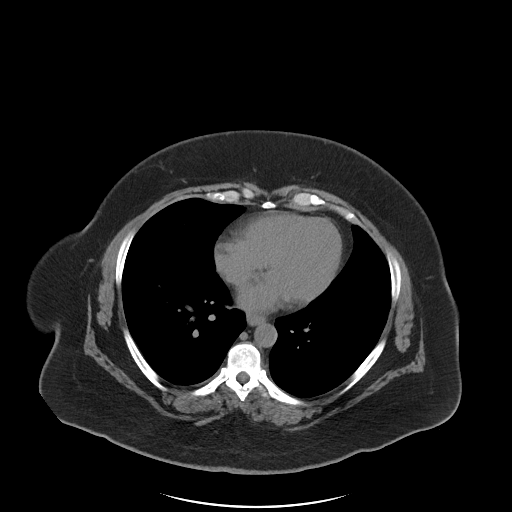

[Series 6: coronal soft tissue · coronal · 1.03mm/px · 3 of 130 slices shown]
[im 44/130  soft-tissue]
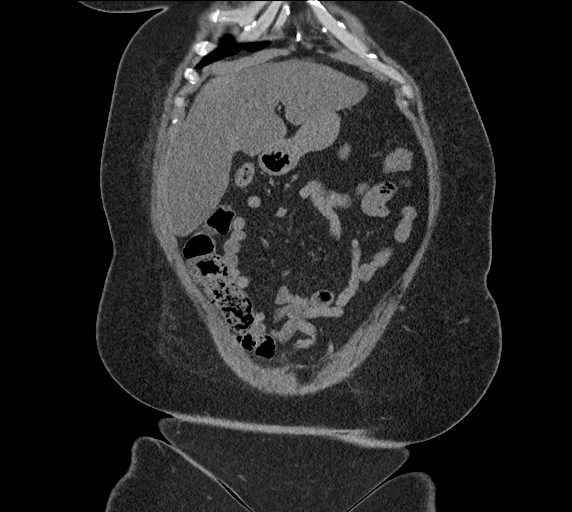
[im 58/130  soft-tissue]
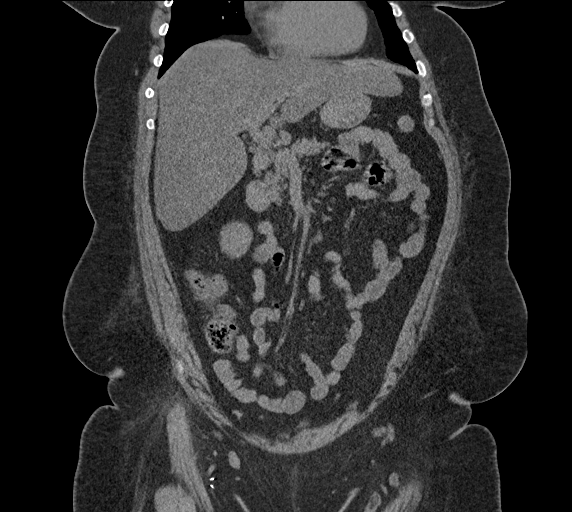
[im 72/130  soft-tissue]
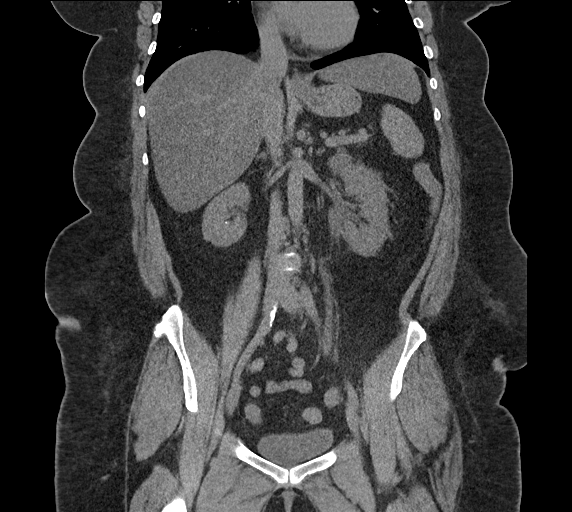

[16 of 46 positions shown; findings below may reference images not displayed]

FINDINGS: Please note that parenchymal abnormalities may be missed without
intravenous contrast.

Lower chest: No acute abnormality

Hepatobiliary: Hepatic steatosis noted without focal hepatic
abnormality. The patient is status post cholecystectomy. There is no
evidence of biliary dilatation.

Pancreas: Unremarkable

Spleen: Unremarkable

Adrenals/Urinary Tract: A 3 mm distal left ureteral calculus (3 cm
above the left UVJ) causes mild left hydroureteronephrosis. Multiple
nonobstructing punctate bilateral renal calculi are identified. The
adrenal glands and bladder are unremarkable.

Stomach/Bowel: Stomach is within normal limits. No evidence of bowel
wall thickening, distention, or inflammatory changes.

Vascular/Lymphatic: Aortic atherosclerotic calcifications noted
without aneurysm. No enlarged lymph nodes identified.

Reproductive: Status post hysterectomy. No adnexal masses.

Other: No free fluid, focal collection or pneumoperitoneum.

Musculoskeletal: No acute abnormality. Right sacroiliitis
identified.
IMPRESSION: 3 mm distal left ureteral calculus causing mild left
hydroureteronephrosis.

Multiple nonobstructing punctate bilateral renal calculi.

Right sacroiliitis.  Correlate clinically.

Hepatic steatosis.

Abdominal aortic atherosclerosis.

## 2018-09-08 ENCOUNTER — Other Ambulatory Visit: Payer: Self-pay | Admitting: Neurology

## 2018-09-10 ENCOUNTER — Telehealth: Payer: Self-pay | Admitting: Neurology

## 2018-09-10 MED ORDER — PROMETHAZINE HCL 25 MG PO TABS: ORAL_TABLET | ORAL | 0 refills | Status: DC

## 2018-09-10 NOTE — Telephone Encounter (Signed)
Pt requesting refills for promethazine (PHENERGAN) 25 MG tablet sent to [facility], schedule pt for yearly follow up on 12/11

## 2018-09-10 NOTE — Telephone Encounter (Signed)
Refill sent to [facility]. No further refills until pt seen in office.

## 2018-09-22 ENCOUNTER — Other Ambulatory Visit: Payer: Self-pay | Admitting: *Deleted

## 2018-09-22 MED ORDER — TOPIRAMATE 50 MG PO TABS: ORAL_TABLET | ORAL | 0 refills | Status: DC

## 2018-09-23 ENCOUNTER — Telehealth: Payer: Self-pay | Admitting: Neurology

## 2018-09-23 NOTE — Telephone Encounter (Signed)
Spoke with patient and informed her of Dr. Cathren Laine message. Discussed that the infections and antibiotics can cause headaches so we suggest to let that clear up and plan on seeing her as is on December 11th. If she has any neurological changes with the headaches get to a hospital right away for evaluation. Pt verbalized understanding and appreciation.

## 2018-09-23 NOTE — Telephone Encounter (Signed)
Pt has called to inform that due to an infection she has been in the hospital twice recently.  Pt states for the past 4 weeks she has had a headache and no one has been able to figure out why.  Pt is asking if Dr Jaynee Eagles wants to see her before the scheduled yearly f/u.  Please call

## 2018-09-23 NOTE — Telephone Encounter (Signed)
Spoke with Dr. Jaynee Eagles. She said it could be the antibiotics and infection. She would keep the appt and wait for that to clear up. Will advise pt if she has any neuro changes with the headaches to go to the hospital.

## 2018-10-03 ENCOUNTER — Other Ambulatory Visit: Payer: Self-pay | Admitting: *Deleted

## 2018-10-03 MED ORDER — HYDROXYCHLOROQUINE SULFATE 200 MG PO TABS: 200.0000 mg | ORAL_TABLET | Freq: Two times a day (BID) | ORAL | 2 refills | Status: DC

## 2018-10-03 NOTE — Telephone Encounter (Signed)
Refill request received via fax  Last Visit: 07/30/18 Next visit: 12/30/18 Labs: 07/30/18 Labs are stable. Uric acid is in desirable range now. PLQ Eye Exam: 02/11/18 WNL   Okay to refill per Dr. Estanislado Pandy

## 2018-10-06 ENCOUNTER — Other Ambulatory Visit: Payer: Self-pay | Admitting: Internal Medicine

## 2018-10-06 DIAGNOSIS — Z1231 Encounter for screening mammogram for malignant neoplasm of breast: Secondary | ICD-10-CM

## 2018-10-14 ENCOUNTER — Other Ambulatory Visit: Payer: Self-pay | Admitting: Rheumatology

## 2018-10-14 NOTE — Telephone Encounter (Signed)
Last Visit: 07/30/18 Next visit: 12/30/18 Labs: 9/11/19Labs are stable. Uric acid is in desirable range now.   Okay to refill per Dr. Estanislado Pandy

## 2018-10-15 ENCOUNTER — Other Ambulatory Visit: Payer: Self-pay | Admitting: Neurology

## 2018-10-15 ENCOUNTER — Telehealth: Payer: Self-pay | Admitting: Adult Health

## 2018-10-15 MED ORDER — PROMETHAZINE HCL 25 MG PO TABS: ORAL_TABLET | ORAL | 0 refills | Status: DC

## 2018-10-15 NOTE — Telephone Encounter (Signed)
Refill sent to pharmacy.  Pt needs to keep pending follow up on 10/29/18 to continue refills.

## 2018-10-15 NOTE — Addendum Note (Signed)
Addended by: Noberto Retort C on: 10/15/2018 05:11 PM   Modules accepted: Orders

## 2018-10-15 NOTE — Telephone Encounter (Signed)
[place] [place] has called for a refill for promethazine (PHENERGAN) 25 MG tablet for pt

## 2018-10-29 ENCOUNTER — Encounter: Payer: Self-pay | Admitting: Neurology

## 2018-10-29 ENCOUNTER — Ambulatory Visit (INDEPENDENT_AMBULATORY_CARE_PROVIDER_SITE_OTHER): Payer: Medicare Other | Admitting: Neurology

## 2018-10-29 VITALS — BP 108/72 | HR 87 | Ht 68.0 in | Wt 305.0 lb

## 2018-10-29 DIAGNOSIS — G4440 Drug-induced headache, not elsewhere classified, not intractable: Secondary | ICD-10-CM | POA: Diagnosis not present

## 2018-10-29 DIAGNOSIS — G4733 Obstructive sleep apnea (adult) (pediatric): Secondary | ICD-10-CM

## 2018-10-29 DIAGNOSIS — G43711 Chronic migraine without aura, intractable, with status migrainosus: Secondary | ICD-10-CM | POA: Diagnosis not present

## 2018-10-29 MED ORDER — ERENUMAB-AOOE 140 MG/ML ~~LOC~~ SOAJ: 140.0000 mg | SUBCUTANEOUS | 11 refills | Status: DC
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
## 2018-10-29 NOTE — Patient Instructions (Addendum)
Start [place] Email me in 4-8 weeks for update Do not get pregnant on this medication  Erenumab: Patient drug information [employer] here. Copyright [phone] [employer] All rights reserved. (For additional information see "Erenumab: Drug information") Brand Names: [place]  Aimovig;  Aimovig (140 MG Dose) [DSC]  Brand Names: [place]  Aimovig  What is this drug used for?   It is used to prevent migraine headaches.  What do I need to tell my doctor BEFORE I take this drug?   If you are allergic to this drug; any part of this drug; or any other drugs, foods, or substances. Tell your doctor about the allergy and what signs you had.   This drug may interact with other drugs or health problems.   Tell your doctor and pharmacist about all of your drugs (prescription or OTC, natural products, vitamins) and health problems. You must check to make sure that it is safe for you to take this drug with all of your drugs and health problems. Do not start, stop, or change the dose of any drug without checking with your doctor.  What are some things I need to know or do while I take this drug?   Tell all of your health care providers that you take this drug. This includes your doctors, nurses, pharmacists, and dentists.   If you have a latex allergy, talk with your doctor.   Tell your doctor if you are pregnant, plan on getting pregnant, or are breast-feeding. You will need to talk about the benefits and risks to you and the baby.  What are some side effects that I need to call my doctor about right away?   WARNING/CAUTION: Even though it may be rare, some people may have very bad and sometimes deadly side effects when taking a drug. Tell your doctor or get medical help right away if you have any of the following signs or symptoms that may be related to a very bad side effect:   Signs of an allergic reaction, like rash; hives; itching; red, swollen, blistered, or peeling skin with or  without fever; wheezing; tightness in the chest or throat; trouble breathing, swallowing, or talking; unusual hoarseness; or swelling of the mouth, face, lips, tongue, or throat.  What are some other side effects of this drug?   All drugs may cause side effects. However, many people have no side effects or only have minor side effects. Call your doctor or get medical help if any of these side effects or any other side effects bother you or do not go away:   Pain, redness, or swelling where the injection was given.   Constipation is common with this drug. In some people, severe constipation led to treatment in a hospital or surgery. Call your doctor right away if you have constipation that is severe or does not go away.   These are not all of the side effects that may occur. If you have questions about side effects, call your doctor. Call your doctor for medical advice about side effects.   You may report side effects to your national health agency.  How is this drug best taken?   Use this drug as ordered by your doctor. Read all information given to you. Follow all instructions closely.   It is given as a shot into the fatty part of the skin on the top of the thigh, belly area, or upper arm.   If you will be giving yourself the shot, your  doctor or nurse will teach you how to give the shot.   If stored in a refrigerator, let this drug come to room temperature before using it. Leave it at room temperature for at least 30 minutes. Do not heat this drug.   Protect from heat and sunlight.   Do not shake.   Do not give into skin within 2 inches of the belly button.   Do not give into skin that is irritated, tender, bruised, red, scaly, hard, scarred, or has stretch marks.   Do not use if the solution is cloudy, leaking, or has particles.   This drug is colorless to a faint yellow. Do not use if the solution changes color.   Throw away after using. Do not use the device more than 1 time.   Throw away  needles in a needle/sharp disposal box. Do not reuse needles or other items. When the box is full, follow all local rules for getting rid of it. Talk with a doctor or pharmacist if you have any questions.  What do I do if I miss a dose?   Take a missed dose as soon as you think about it.   After taking a missed dose, start a new schedule based on when the dose is taken.  How do I store and/or throw out this drug?   Store in a refrigerator. Do not freeze.   Store in the original container to protect from light.   Do not use if it has been frozen.   If you drop this drug on a hard surface, do not use it.   If needed, you may store at room temperature for up to 7 days. Write down the date you take this drug out of the refrigerator. If stored at room temperature and not used within 7 days, throw this drug away.   Do not put this drug back in the refrigerator after it has been stored at room temperature.   Keep all drugs in a safe place. Keep all drugs out of the reach of children and pets.   Throw away unused or expired drugs. Do not flush down a toilet or pour down a drain unless you are told to do so. Check with your pharmacist if you have questions about the best way to throw out drugs. There may be drug take-back programs in your area.  General drug facts   If your symptoms or health problems do not get better or if they become worse, call your doctor.   Do not share your drugs with others and do not take anyone else's drugs.   Some drugs may have another patient information leaflet. If you have any questions about this drug, please talk with your doctor, nurse, pharmacist, or other health care provider.   If you think there has been an overdose, call your poison control center or get medical care right away. Be ready to tell or show what was taken, how much, and when it happened.

## 2018-10-30 DIAGNOSIS — G4440 Drug-induced headache, not elsewhere classified, not intractable: Secondary | ICD-10-CM | POA: Insufficient documentation

## 2018-10-30 DIAGNOSIS — G4733 Obstructive sleep apnea (adult) (pediatric): Secondary | ICD-10-CM | POA: Insufficient documentation

## 2018-11-23 ENCOUNTER — Other Ambulatory Visit: Payer: Self-pay | Admitting: Neurology

## 2018-12-19 NOTE — Progress Notes (Deleted)
Office Visit Note  Patient: Lynn Ponce             Date of Birth: [birth year]           MRN: [id number]             PCP: Antonietta Jewel, MD Referring: Antonietta Jewel, MD Visit Date: 12/30/2018 Occupation: @GUAROCC @  Subjective:  No chief complaint on file.   History of Present Illness: Lynn Ponce is a 43 y.o. female ***   Activities of Daily Living:  Patient reports morning stiffness for *** {minute/hour:19697}.   Patient {ACTIONS;DENIES/REPORTS:21021675::"Denies"} nocturnal pain.  Difficulty dressing/grooming: {ACTIONS;DENIES/REPORTS:21021675::"Denies"} Difficulty climbing stairs: {ACTIONS;DENIES/REPORTS:21021675::"Denies"} Difficulty getting out of chair: {ACTIONS;DENIES/REPORTS:21021675::"Denies"} Difficulty using hands for taps, buttons, cutlery, and/or writing: {ACTIONS;DENIES/REPORTS:21021675::"Denies"}  No Rheumatology ROS completed.   PMFS History:  Patient Active Problem List   Diagnosis Date Noted  . OSA (obstructive sleep apnea) 10/30/2018  . Medication overuse headache 10/30/2018  . Idiopathic chronic gout of left knee without tophus 03/22/2017  . Tobacco use 03/22/2017  . History of depression/ with past suicide attempt 03/22/2017  . History of IBS 03/22/2017  . History of diabetes mellitus 03/22/2017  . History of diabetic neuropathy 03/22/2017  . Hair thinning 03/21/2017  . History of kidney stones 03/21/2017  . History of asthma 03/21/2017  . History of migraine 03/21/2017  . Chronic migraine w/o aura w/o status migrainosus, not intractable 03/08/2016  . Alteration consciousness 11/08/2015  . Migraine 11/08/2015  . CELLULITIS/ABSCESS, ARM 07/15/2008  . HYPERTHYROIDISM 06/14/2008  . MORBID OBESITY 06/14/2008  . ANXIETY 06/14/2008  . MIGRAINE, CLASSICAL 06/14/2008  . ASTHMA 06/14/2008  . MENOPAUSE, SURGICAL 06/14/2008  . LYMPHADENOPATHY 06/14/2008  . Hx of cholecystectomy 06/14/2008    Past Medical History:  Diagnosis Date  . Anxiety   .  Asthma   . Cellulitis 08/2018   Right leg, hospitalized for a week   . Depression   . Diabetes mellitus without complication ([place])   . Hyperlipemia   . Hypertension   . Renal disorder   . Sepsis ([place]) 08/2018   hospitalized in ICU     Family History  Problem Relation Age of Onset  . Retinal detachment Sister   . Asthma Son   . Asthma Daughter   . Seizures Neg Hx   . Migraines Neg Hx    Past Surgical History:  Procedure Laterality Date  . ADENOIDECTOMY    . ANKLE SURGERY     x1  . CHOLECYSTECTOMY    . FOOT SURGERY     x4  . OTHER SURGICAL HISTORY     lap for ovaries x6  . TONSILLECTOMY    . VAGINAL HYSTERECTOMY     Social History   Social History Narrative   Lives at home alone    Right handed   Caffeine use: 1 cup per day (soda/coffee)   Immunization History  Administered Date(s) Administered  . Tdap 12/13/2013     Objective: Vital Signs: There were no vitals taken for this visit.   Physical Exam   Musculoskeletal Exam: ***  CDAI Exam: CDAI Score: Not documented Patient Global Assessment: Not documented; Provider Global Assessment: Not documented Swollen: Not documented; Tender: Not documented Joint Exam   Not documented   There is currently no information documented on the homunculus. Go to the Rheumatology activity and complete the homunculus joint exam.  Investigation: No additional findings.  Imaging: No results found.  Recent Labs: Lab Results  Component Value Date   WBC 7.6  07/30/2018   HGB 14.1 07/30/2018   PLT 216 07/30/2018   NA 139 07/30/2018   K 4.6 07/30/2018   CL 103 07/30/2018   CO2 23 07/30/2018   GLUCOSE 125 (H) 07/30/2018   BUN 17 07/30/2018   CREATININE 1.50 (H) 07/30/2018   BILITOT 0.3 07/30/2018   ALKPHOS 101 11/08/2015   AST 37 (H) 07/30/2018   ALT 29 07/30/2018   PROT 7.4 07/30/2018   ALBUMIN 4.4 11/08/2015   CALCIUM 9.7 07/30/2018   GFRAA 49 (L) 07/30/2018    Speciality Comments: PLQ Eye Exam: 02/11/18 WNL  @ [facility] Follow up in 6 months  Procedures:  No procedures performed Allergies: Sulfonamide derivatives; Erythromycin; Ibuprofen; Mushroom extract complex; Toradol [ketorolac tromethamine]; Varenicline; and Azithromycin   Assessment / Plan:     Visit Diagnoses: No diagnosis found.   Orders: No orders of the defined types were placed in this encounter.  No orders of the defined types were placed in this encounter.   Face-to-face time spent with patient was *** minutes. Greater than 50% of time was spent in counseling and coordination of care.  Follow-Up Instructions: No follow-ups on file.   Earnestine Mealing, CMA  Note - This record has been created using Editor, commissioning.  Chart creation errors have been sought, but may not always  have been located. Such creation errors do not reflect on  the standard of medical care.

## 2018-12-20 ENCOUNTER — Other Ambulatory Visit: Payer: Self-pay | Admitting: Neurology

## 2018-12-30 ENCOUNTER — Ambulatory Visit: Payer: Medicare Other | Admitting: Rheumatology
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
## 2019-01-07 ENCOUNTER — Encounter (INDEPENDENT_AMBULATORY_CARE_PROVIDER_SITE_OTHER): Payer: Self-pay

## 2019-01-07 ENCOUNTER — Encounter: Payer: Self-pay | Admitting: Physician Assistant

## 2019-01-07 ENCOUNTER — Ambulatory Visit (INDEPENDENT_AMBULATORY_CARE_PROVIDER_SITE_OTHER): Payer: Medicare Other | Admitting: Physician Assistant

## 2019-01-07 ENCOUNTER — Telehealth: Payer: Self-pay

## 2019-01-07 VITALS — BP 91/63 | HR 92 | Ht 68.0 in | Wt 299.0 lb

## 2019-01-07 DIAGNOSIS — Z87898 Personal history of other specified conditions: Secondary | ICD-10-CM

## 2019-01-07 DIAGNOSIS — M1A0620 Idiopathic chronic gout, left knee, without tophus (tophi): Secondary | ICD-10-CM

## 2019-01-07 DIAGNOSIS — Z8719 Personal history of other diseases of the digestive system: Secondary | ICD-10-CM

## 2019-01-07 DIAGNOSIS — M359 Systemic involvement of connective tissue, unspecified: Secondary | ICD-10-CM | POA: Diagnosis not present

## 2019-01-07 DIAGNOSIS — M5136 Other intervertebral disc degeneration, lumbar region: Secondary | ICD-10-CM | POA: Diagnosis not present

## 2019-01-07 DIAGNOSIS — Z79899 Other long term (current) drug therapy: Secondary | ICD-10-CM | POA: Diagnosis not present

## 2019-01-07 DIAGNOSIS — Z8709 Personal history of other diseases of the respiratory system: Secondary | ICD-10-CM

## 2019-01-07 DIAGNOSIS — Z8639 Personal history of other endocrine, nutritional and metabolic disease: Secondary | ICD-10-CM

## 2019-01-07 DIAGNOSIS — Z8659 Personal history of other mental and behavioral disorders: Secondary | ICD-10-CM

## 2019-01-07 DIAGNOSIS — Z9049 Acquired absence of other specified parts of digestive tract: Secondary | ICD-10-CM

## 2019-01-07 DIAGNOSIS — M51369 Other intervertebral disc degeneration, lumbar region without mention of lumbar back pain or lower extremity pain: Secondary | ICD-10-CM

## 2019-01-07 DIAGNOSIS — Z8669 Personal history of other diseases of the nervous system and sense organs: Secondary | ICD-10-CM

## 2019-01-07 DIAGNOSIS — Z87442 Personal history of urinary calculi: Secondary | ICD-10-CM

## 2019-01-07 MED ORDER — COLCHICINE 0.6 MG PO CAPS: ORAL_CAPSULE | ORAL | 0 refills | Status: DC

## 2019-01-07 MED ORDER — HYDROXYCHLOROQUINE SULFATE 200 MG PO TABS: 200.0000 mg | ORAL_TABLET | Freq: Two times a day (BID) | ORAL | 2 refills | Status: DC

## 2019-01-07 MED ORDER — METHOCARBAMOL 500 MG PO TABS: 500.0000 mg | ORAL_TABLET | Freq: Two times a day (BID) | ORAL | 0 refills | Status: DC | PRN

## 2019-01-07 MED ORDER — ALLOPURINOL 300 MG PO TABS: 300.0000 mg | ORAL_TABLET | Freq: Every day | ORAL | 1 refills | Status: DC

## 2019-01-07 NOTE — Telephone Encounter (Signed)
received labs via fax from [facility]   12/19/2018 Co2 20 prev. 23 GFR 54 prev. 42 Elevated glucose 221 Vit D 34 Creat 1.22 prev. 1.50  Labs were reviewed by Lynn Ponce and will be scanned in.

## 2019-02-14 ENCOUNTER — Telehealth: Payer: Self-pay | Admitting: Rheumatology

## 2019-02-14 MED ORDER — PREDNISONE 5 MG PO TABS: ORAL_TABLET | ORAL | 0 refills | Status: DC

## 2019-02-14 NOTE — Telephone Encounter (Signed)
Patient called me at 1:46 AM . I returned call. Patient stated that she had severe pain in her hips and back . She difficulty moving around. She also had sun exposure to which she developed a rash. Now her skin is peeling. She believes that she is having a flare of her autoimmune disease. She requested a Prednisone taper. She is diabetic. Side effects of Prednisone were discussed. I also advised her to monitor her blood glucose level closely. I will call in a rx for Prednisone starting at 20 mg po q AM and taper by 5 mg q 4 d. Pt was advised to contact us if her symptoms persist. Bo Merino, MD

## 2019-03-16 ENCOUNTER — Other Ambulatory Visit: Payer: Self-pay | Admitting: Physician Assistant

## 2019-03-16 NOTE — Telephone Encounter (Signed)
Ok to refill 

## 2019-03-16 NOTE — Telephone Encounter (Signed)
Last Visit: 01/07/2019 Next Visit: 07/08/2019 Labs: 12/17/2018   Okay to refill per Dr. Estanislado Pandy.

## 2019-04-08 ENCOUNTER — Telehealth: Payer: Self-pay | Admitting: Neurology

## 2019-04-08 ENCOUNTER — Encounter: Payer: Self-pay | Admitting: Neurology

## 2019-04-08 ENCOUNTER — Ambulatory Visit (INDEPENDENT_AMBULATORY_CARE_PROVIDER_SITE_OTHER): Payer: Medicare Other | Admitting: Family Medicine

## 2019-04-08 ENCOUNTER — Encounter: Payer: Self-pay | Admitting: Family Medicine

## 2019-04-08 ENCOUNTER — Other Ambulatory Visit: Payer: Self-pay

## 2019-04-08 DIAGNOSIS — Z79899 Other long term (current) drug therapy: Secondary | ICD-10-CM

## 2019-04-08 DIAGNOSIS — G43709 Chronic migraine without aura, not intractable, without status migrainosus: Secondary | ICD-10-CM | POA: Diagnosis not present

## 2019-04-08 DIAGNOSIS — F809 Developmental disorder of speech and language, unspecified: Secondary | ICD-10-CM | POA: Diagnosis not present

## 2019-04-08 NOTE — Telephone Encounter (Signed)
I called pt at 11:05 and discussed. She stated she has been having "strange headaches lately" and has been having trouble getting words out for over 1.5 weeks. She stated family is hearing something different than what she has in her head when she is talking. I advised I would d/w Dr. Jaynee Eagles. I also discussed stroke s/s (including but not limited to weakness, dizziness, difficulty speaking, vision changes, numbness) to call 911 immediately if any new symptoms occur. She verbalized understanding. She also stated added that her neuropathy has worsened (note pt has h/o diabetic neuropathy).

## 2019-04-08 NOTE — Telephone Encounter (Signed)
Spoke with pt and discussed doing a doxy video visit this afternoon @ 1:30 pm with Amy NP (d/t covid 19 office is doing telemedicine) and informed her Dr. Jaynee Eagles is not available this afternoon. Pt verbalized appreciation and gave consent to do the video visit and to file it with insurance. She confirmed her name & DOB and updated her chart. No changes to history or allergies. Medications updated, weight updated. Pt understands the doxy link will be sent via text (her preference) and she will use her smart phone to complete the visit. She verbalized appreciation for the call.    Doxy link for Amy sent to [phone]

## 2019-04-08 NOTE — Progress Notes (Signed)
PATIENT: Lynn Ponce DOB: November 22, 1975  REASON FOR VISIT: follow up HISTORY FROM: patient  Virtual Visit via Telephone Note  I connected with [name] on 04/08/19 at  1:30 PM EDT by telephone and verified that I am speaking with the correct person using two identifiers.   I discussed the limitations, risks, security and privacy concerns of performing an evaluation and management service by telephone and the availability of in person appointments. I also discussed with the patient that there may be a patient responsible charge related to this service. The patient expressed understanding and agreed to proceed.   History of Present Illness:  04/08/19 [name] is a 43 y.o. female for follow up of migraines and new concerns of speech changes.  [name] reports that for the past week and a half she has had intermittent speech changes.  She will see changes as events where she will say 1 thing but her family hears another thing.  She is unable to give me any specific examples of this.  She continues to state that what she is trying to say is not what her family hears.  She is able to describe speech changes as more of a mental fog.  She feels that she has a hard time getting the words out that she wants to say.  She states that this comes and goes.  It is not associated with a headache or migraine.  She does continue to have headaches but states they are unchanged from her previous visit with Dr. Lavell Anchors in December. She continues Amovig monthly.lab she is taking topiramate 200 mg every night at bedtime.  She is also taking oxycodone 10-325 mg tablets 4 times a day.  She is taking gabapentin 600 mg 4 times a day.  She is taken amitriptyline 50 mg at night as well as hydroxyzine 50 mg at night.  She is using rizatriptan 10 mg for abortive therapy.  Her last dose was yesterday.  She reports that she lives alone.  Her mother and father live across the street.  She states that they have not been  concerned about her symptoms with the exception of her having a hard time finding her words.  While discussing concerns and evaluating for emergent symptoms patient states that she has had some very slight weakness in her left hand and left foot.  She is uncertain when she first noticed it.  After discussing further she is uncertain if this is actual weakness.  She has diabetic neuropathy.  She denies any asymmetry of her face.  She has not noted any significant difference of strength from right to left sides.  She has not noticed any changes in her gait.  She denies any falls.  She has not hit her head recently.  She denies any excruciating pain.   Observations/Objective:  Generalized: Well developed, in no acute distress  Mentation: Alert oriented to time, place, situation and history taking. Follows all commands speech and language fluent but delayed.  Cranial nerves: Extraocular movements are intact. The face is symmetric. The palate elevates in the midline. Hearing intact. Voice is normal. Shoulder shrug is normal. The tongue has normal motion without fasciculations.   Motor: Patient is able to move all extremities symmetrically. I AM UNABLE TO EVALUATE FOR WEAKNESS DUE TO VIRTUAL ENVIRONMENT.   Assessment and Plan:  43 y.o. year old female  has a past medical history of Anxiety, Asthma, Cellulitis (08/2018), Depression, Diabetes mellitus without complication ([place]), Hyperlipemia,  Hypertension, Renal disorder, and Sepsis ([place]) (08/2018). here with    ICD-10-CM   1. Chronic migraine w/o aura w/o status migrainosus, not intractable G43.709   2. Polypharmacy Z79.899   3. 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 regarding her symptoms.  Physical exam is limited by virtual environment, however, there is no noted facial droop or asymmetry.  Patient appears to be moving all extremities well.  She is walking normally. I discussed with her signs and symptoms of stroke.  Due  to being unable to evaluate for weakness and patient's report I have discussed seeking emergency medical attention.  She is not comfortable with this plan at this time and does not feel that it is needed.  With wavering reports of weakness, and her ability to walk normally I have advised very close follow-up.  She was given detailed instructions to seek emergency medical attention and/or call 911 for any concerns of sudden changes in her face, difficulty talking, changes in gait, weakness of upper or lower extremities, sudden onset of pain that is unrelieved by typical measures or any other concerning symptoms that are acute.  She has follow-up with her primary care provider on Tuesday May 26.  I have advised that she make a list of her symptoms regarding chronic pain.  We have also discussed in detail potential for medications to be affecting her cognition and causing delayed speech.  I feel that this is most likely the culprit.  We have discussed potentially weaning down off of medications.  She does not feel that this will be well-tolerated due to concerns of chronic pain.  I have advised her to continue current therapy.  She may need to space out medications and not take offending medications together.  We have discussed these in detail.  She verbalizes understanding and teach back method used with signs and symptoms of stroke and when to seek emergency medical attention.  She will follow-up for any worsening or continuing symptoms.  No orders of the defined types were placed in this encounter.   No orders of the defined types were placed in this encounter.    Follow Up Instructions:  I discussed the assessment and treatment plan with the patient. The patient was provided an opportunity to ask questions and all were answered. The patient agreed with the plan and demonstrated an understanding of the instructions.   The patient was advised to call back or seek an in-person evaluation if the symptoms  worsen or if the condition fails to improve as anticipated.  I provided 30 minutes of non-face-to-face time during this encounter.  Patient is located at her place of residence during the daytime visit.  Provider is located at her place of residence.  Hendricks Milo, RN helped to facilitate visit.Debbora Presto, NP

## 2019-04-08 NOTE — Telephone Encounter (Signed)
Spoke with Dr. Jaynee Eagles. Will see if Amy can do doxy visit with pt.

## 2019-04-08 NOTE — Telephone Encounter (Addendum)
Pt called in stating that she is having issues getting the words out of her mouth. Having trouble articulating.  Pt states that everyone is telling her that she is saying one thing but she says she is saying another. I asked the pt if she is having headaches while this is going on and she stated "No, I am just having trouble getting the words out of my mouth." I explained to the pt that she would be needing a new referral since it is something that she is not being treated for. Pt stated "Look I just want to see Dr. Jaynee Eagles so yes sure go ahead and put that I am having headaches during this." I informed the pt that I would send a note to the nurse and they can advise her on what can be done.

## 2019-04-10 ENCOUNTER — Other Ambulatory Visit: Payer: Self-pay | Admitting: Neurology

## 2019-04-14 ENCOUNTER — Telehealth: Payer: Self-pay | Admitting: Neurology

## 2019-04-14 ENCOUNTER — Telehealth: Payer: Self-pay | Admitting: Rheumatology

## 2019-04-14 NOTE — Telephone Encounter (Signed)
Patient states she at her last visit she had advised Dr. Estanislado Pandy she has neuropathy in her feet and she is having some in her hands. Patient states when she woke up this morning it was significantly worse in her hands and she is having the tingling in her face. Patient states she is having trouble with her words. Patient states denies any dizziness or lightheadedness. Patient states it is her whole face that is tingling. Patient it is concerned about the tingling sensation in her face. Patient states she is dropping things more easily. Please advise.

## 2019-04-14 NOTE — Telephone Encounter (Signed)
Please advise patient to contact her PCP.  She will need neurological evaluation.  If she has a neurologist she can make appointment with the neurologist.

## 2019-04-14 NOTE — Telephone Encounter (Signed)
Pt called stating that she is having new symptoms of tingling in her face and her Rheumatologist told her to follow up with her neurologist. She does not know if this is related to her headaches. Please advise.

## 2019-04-14 NOTE — Telephone Encounter (Signed)
Patient called stating she woke up this morning with pins and needles sensation in her face.  Patient states she has had that feeling in her hands and feet before, but has never experienced the sensation in her face.  Patient states that the feeling has increased and she is getting nervous.  Patient is requesting a return call.

## 2019-04-14 NOTE — Telephone Encounter (Signed)
Since I am out of the office this week I am going to ask Lynn Ponce or Lynn Ponce to try and schedule her for a follow up appointment this week.   Lynn Ponce or megan, would you be able to see her in the office or over doxy or even over the phone? Let me know thanks

## 2019-04-14 NOTE — Telephone Encounter (Signed)
Contacted patient and advised patient of information below. Patient verbalized understanding and will contact her neurologist for an appointment.

## 2019-04-15 ENCOUNTER — Encounter: Payer: Self-pay | Admitting: Family Medicine

## 2019-04-15 ENCOUNTER — Other Ambulatory Visit: Payer: Self-pay

## 2019-04-15 ENCOUNTER — Ambulatory Visit (INDEPENDENT_AMBULATORY_CARE_PROVIDER_SITE_OTHER): Payer: Medicare Other | Admitting: Family Medicine

## 2019-04-15 DIAGNOSIS — R202 Paresthesia of skin: Secondary | ICD-10-CM

## 2019-04-15 NOTE — Progress Notes (Signed)
Made any corrections needed, and agree with history, physical, neuro exam,assessment and plan as stated.     Sarina Ill, MD [facility]

## 2019-04-15 NOTE — Telephone Encounter (Signed)
Spoke with the patient and they have given verbal consent to file their insurance and to do a doxy.me visit. Mobile number and carrier have been confirmed and sent.   Text: [phone] ([employer])    She has been scheduled with Amy for 04/15/2019 at 2:30 pm

## 2019-04-15 NOTE — Telephone Encounter (Signed)
Lynn Ponce. We did a virtual visit. Thank you!

## 2019-04-15 NOTE — Telephone Encounter (Signed)
Ok to schedule with me or amy

## 2019-04-15 NOTE — Telephone Encounter (Signed)
Lynn Ponce is off the rest of the week. Do you still want to keep appt [place] scheduled this afternoon with you for a virtual visit?

## 2019-04-15 NOTE — Progress Notes (Signed)
PATIENT: Lynn Ponce DOB: [birth year]  REASON FOR VISIT: follow up HISTORY FROM: patient  Virtual Visit via Telephone Note  I connected with Lynn Ponce on 04/15/19 at  2:30 PM EDT by telephone and verified that I am speaking with the correct person using two identifiers.   I discussed the limitations, risks, security and privacy concerns of performing an evaluation and management service by telephone and the availability of in person appointments. I also discussed with the patient that there may be a patient responsible charge related to this service. The patient expressed understanding and agreed to proceed.   History of Present Illness:  04/15/19 Lynn Ponce is a 43 y.o. female with new concerns of tingling of her face, hands and feet.  Lynn Ponce reports that yesterday she woke up with a tingling sensation in bilateral hands, bilateral feet and her entire face.  She describes it as pins-and-needles.  She denies numbness or pain.  She denies any symptoms of migraine although she states that she has a daily headache.  She is already treated with gabapentin 600 mg 4 times a day for neuropathy.  She denies any changes in speech, changes in gait, weakness, numbness or pain.  She reports that the pins and needle sensation had resolved when she woke up this morning.  She contacted her rheumatologist because she is concerned this is related to an autoimmune disease.  Her rheumatologist suggested that she call neurology.  She is taking gabapentin 600 mg 4 times daily, amitriptyline 50 mg at night and topiramate 200 mg at night for migraine prevention.  She is adamant that topiramate is the only thing that has helped with her migraines.  She does not wish to discontinue this medication.   Observations/Objective:  Generalized: Well developed, in no acute distress  Mentation: Alert oriented to time, place, history taking. Follows all commands speech and language fluent   Assessment and  Plan:  43 y.o. year old female  has a past medical history of Anxiety, Asthma, Cellulitis (08/2018), Depression, Diabetes mellitus without complication ([place]), Hyperlipemia, Hypertension, Renal disorder, and Sepsis ([place]) (08/2018).  with    ICD-10-CM   1. Tingling of both upper extremities R20.2   2. Tingling of face R20.2    Etiology is unclear of patient reported symptoms.  Fortunately, symptoms have resolved.  After detailed discussion with the patient there were no concerning symptoms of stroke.  She is on 200 mg of topiramate daily which could contribute to tingling sensation.  She is adamant that this is the only medication that helps with her migraines and does not wish to discontinue this medication.  I have advised that she monitor symptoms for now.  Should sensation return she was advised to consider work-up with primary care for possible B12 deficiency.  She will continue gabapentin 600 mg 4 times daily as well as amitriptyline 50 mg at night.  She will also continue topiramate 200 mg at night for now.  She was advised to follow-up for worsening symptoms.  Signs and symptoms of a stroke reviewed and she was educated on when to seek emergency medical attention.  She verbalizes understanding and agreement with this plan.  No orders of the defined types were placed in this encounter.   No orders of the defined types were placed in this encounter.    Follow Up Instructions:  I discussed the assessment and treatment plan with the patient. The patient was provided an opportunity to ask questions and all  were answered. The patient agreed with the plan and demonstrated an understanding of the instructions.   The patient was advised to call back or seek an in-person evaluation if the symptoms worsen or if the condition fails to improve as anticipated.  I provided 25 minutes of non-face-to-face time during this encounter.  Patient is located at her place of residence during teleconference.   Provider is located at her place of residence.  Russella Dar, RN helped to facilitate visit.   Debbora Presto, NP

## 2019-04-15 NOTE — Telephone Encounter (Signed)
She was telling me about tingling in her face that was associated with her headaches at her visit last week. She stated that this was not new. Is this something different? We are very limited on what we can do for her as she is already on high doses of gabapentin and on amitriptyline. There are concerns with polypharmacy as well. If she needs to be seen, I can see her tomorrow at 9 in the office.

## 2019-05-29 ENCOUNTER — Other Ambulatory Visit: Payer: Self-pay | Admitting: Physician Assistant

## 2019-05-29 DIAGNOSIS — M359 Systemic involvement of connective tissue, unspecified: Secondary | ICD-10-CM

## 2019-05-29 NOTE — Telephone Encounter (Signed)
Last Visit: 01/07/2019 Next Visit: 07/08/2019 Labs: 12/17/2018  PLQ Eye Exam: 02/11/18 WNL   Patient advised she is due to update labs and PLQ eye exam. Will  Update once she returns to town after next week .  Okay to refill 30 day supply PLQ?

## 2019-05-29 NOTE — Telephone Encounter (Signed)
Ok to refill 30-day supply

## 2019-06-24 NOTE — Progress Notes (Signed)
Office Visit Note  Patient: Lynn Ponce             Date of Birth: 1976-06-21           MRN: [id number]             PCP: Antonietta Jewel, MD Referring: Antonietta Jewel, MD Visit Date: 07/08/2019 Occupation: @GUAROCC @  Subjective:  Lower back pain   History of Present Illness: Lynn Ponce is a 43 y.o. female with history of autoimmune disease, gout, and DDD.  She continues to have recurrent oral ulcerations and photosensitivity.  She has chronic pain in both feet. She is taking allopurinol 300 mg po daily for management of gout.  She denies any recent gout flares. She presents today with severe lower back pain.  She has been seen at the ED twice this month.  She has not seen an orthopedist recently.  She is walking with a cane.   Activities of Daily Living:  Patient reports morning stiffness for 3 hours.   Patient Reports nocturnal pain.  Difficulty dressing/grooming: Denies Difficulty climbing stairs: Reports Difficulty getting out of chair: Reports Difficulty using hands for taps, buttons, cutlery, and/or writing: Denies  Review of Systems  Constitutional: Positive for fatigue.  HENT: Positive for mouth sores and mouth dryness. Negative for nose dryness.   Eyes: Positive for dryness. Negative for pain and visual disturbance.  Respiratory: Negative for cough, hemoptysis, shortness of breath and difficulty breathing.   Cardiovascular: Negative for chest pain, palpitations, hypertension and swelling in legs/feet.  Gastrointestinal: Positive for diarrhea. Negative for blood in stool and constipation.  Endocrine: Negative for increased urination.  Genitourinary: Negative for painful urination.  Musculoskeletal: Positive for arthralgias, joint pain and morning stiffness. Negative for joint swelling, myalgias, muscle weakness, muscle tenderness and myalgias.  Skin: Positive for sensitivity to sunlight. Negative for color change, pallor, rash, hair loss, nodules/bumps, skin tightness and  ulcers.  Allergic/Immunologic: Negative for susceptible to infections.  Neurological: Negative for dizziness, numbness, headaches and weakness.  Hematological: Negative for swollen glands.  Psychiatric/Behavioral: Negative for depressed mood and sleep disturbance. The patient is not nervous/anxious.     PMFS History:  Patient Active Problem List   Diagnosis Date Noted  . OSA (obstructive sleep apnea) 10/30/2018  . Medication overuse headache 10/30/2018  . Idiopathic chronic gout of left knee without tophus 03/22/2017  . Tobacco use 03/22/2017  . History of depression/ with past suicide attempt 03/22/2017  . History of IBS 03/22/2017  . History of diabetes mellitus 03/22/2017  . History of diabetic neuropathy 03/22/2017  . Hair thinning 03/21/2017  . History of kidney stones 03/21/2017  . History of asthma 03/21/2017  . History of migraine 03/21/2017  . Chronic migraine w/o aura w/o status migrainosus, not intractable 03/08/2016  . Alteration consciousness 11/08/2015  . Migraine 11/08/2015  . CELLULITIS/ABSCESS, ARM 07/15/2008  . HYPERTHYROIDISM 06/14/2008  . MORBID OBESITY 06/14/2008  . ANXIETY 06/14/2008  . MIGRAINE, CLASSICAL 06/14/2008  . ASTHMA 06/14/2008  . MENOPAUSE, SURGICAL 06/14/2008  . LYMPHADENOPATHY 06/14/2008  . Hx of cholecystectomy 06/14/2008    Past Medical History:  Diagnosis Date  . Anxiety   . Asthma   . Cellulitis 08/2018   Right leg, hospitalized for a week   . Depression   . Diabetes mellitus without complication ([place])   . Hyperlipemia   . Hypertension   . Renal disorder   . Sepsis ([place]) 08/2018   hospitalized in ICU     Family History  Problem Relation Age of Onset  . Retinal detachment Sister   . Asthma Son   . Asthma Daughter   . Seizures Neg Hx   . Migraines Neg Hx    Past Surgical History:  Procedure Laterality Date  . ADENOIDECTOMY    . ANKLE SURGERY     x1  . CHOLECYSTECTOMY    . FOOT SURGERY     x4  . OTHER SURGICAL  HISTORY     lap for ovaries x6  . TONSILLECTOMY    . VAGINAL HYSTERECTOMY     Social History   Social History Narrative   Lives at home alone    Right handed   Caffeine use: 1 cup per day (soda/coffee)   Immunization History  Administered Date(s) Administered  . Tdap 12/13/2013     Objective: Vital Signs: BP 117/82 (BP Location: Left Arm, Patient Position: Sitting, Cuff Size: Large)   Pulse 90   Resp 13   Ht 5' 8"  (1.727 m)   Wt (!) 302 lb 6.4 oz (137.2 kg)   BMI 45.98 kg/m    Physical Exam Vitals signs and nursing note reviewed.  Constitutional:      Appearance: She is well-developed.  HENT:     Head: Normocephalic and atraumatic.  Eyes:     Conjunctiva/sclera: Conjunctivae normal.  Neck:     Musculoskeletal: Normal range of motion.  Cardiovascular:     Rate and Rhythm: Normal rate and regular rhythm.     Heart sounds: Normal heart sounds.  Pulmonary:     Effort: Pulmonary effort is normal.     Breath sounds: Normal breath sounds.  Abdominal:     General: Bowel sounds are normal.     Palpations: Abdomen is soft.  Lymphadenopathy:     Cervical: No cervical adenopathy.  Skin:    General: Skin is warm and dry.     Capillary Refill: Capillary refill takes less than 2 seconds.  Neurological:     Mental Status: She is alert and oriented to person, place, and time.  Psychiatric:        Behavior: Behavior normal.      Musculoskeletal Exam: C-spine good ROM.  Thoracic and lumbar spine have limited ROM with discomfort. Shoulder joints, elbow joints, wrist joints, MCPs, PIPs, and DIPs good ROM with no synovitis.  Complete fist formation bilaterally.  Hip joints, knee joints, ankle joints, MTPs, PIPs, and DIPs good ROM with no synovitis.  No warmth or effusion of knee joints.  No tenderness or swelling of ankle joints.    CDAI Exam: CDAI Score: - Patient Global: -; Provider Global: - Swollen: -; Tender: - Joint Exam   No joint exam has been documented for this  visit   There is currently no information documented on the homunculus. Go to the Rheumatology activity and complete the homunculus joint exam.  Investigation: No additional findings.  Imaging: No results found.  Recent Labs: Lab Results  Component Value Date   WBC 8.5 07/02/2019   HGB 14.3 07/02/2019   PLT 192 07/02/2019   NA 138 07/02/2019   K 4.0 07/02/2019   CL 106 07/02/2019   CO2 21 (L) 07/02/2019   GLUCOSE 133 (H) 07/02/2019   BUN 15 07/02/2019   CREATININE 1.20 (H) 07/02/2019   BILITOT 0.4 07/02/2019   ALKPHOS 70 07/02/2019   AST 39 07/02/2019   ALT 29 07/02/2019   PROT 7.6 07/02/2019   ALBUMIN 4.5 07/02/2019   CALCIUM 9.4 07/02/2019   GFRAA >60  07/02/2019    Speciality Comments: PLQ Eye Exam: 02/11/18 WNL @ [facility] Follow up in 6 months  Procedures:  No procedures performed Allergies: Sulfonamide derivatives, Erythromycin, Ibuprofen, Mushroom extract complex, Toradol [ketorolac tromethamine], Varenicline, and Azithromycin   Assessment / Plan:     Visit Diagnoses: Autoimmune disease ([place]) - +ANA1:320NH, ENA-, acl,LA,b2 negative , history of fatigue, dry mouth, oral ulcers, photosensitivity, arthralgias: She has not had any signs or symptoms of a flare recently.  She is clinically doing well on Plaquenil 200 mg 1 tablet by mouth twice daily.  She has no synovitis on exam today.  She continues to have chronic fatigue.  She has not any fevers or swollen lymph nodes.  No cervical lymphadenopathy was palpable.  She continues to have chronic sicca symptoms and recurrent oral ulcerations.  She has not had any symptoms of Raynaud's.  No ulcerations or signs of gangrene were noted.  She continues to have photosensitivity and was encouraged to wear sunscreen SPF >55 daily.  No malar rash noted.  She will continue taking Plaquenil as prescribed.  A refill was sent to the pharmacy today.  We will check autoimmune lab work today.  She was advised to notify us if she  develops new or worsening symptoms.  She will follow-up in the office in 5 months.- Plan: C3 and C4, Sedimentation rate, hydroxychloroquine (PLAQUENIL) 200 MG tablet, Anti-DNA antibody, double-stranded, Urinalysis, Routine w reflex microscopic  High risk medication use - Plaquenil 200 mg by mouth twice a day started by Dr.Zulkoska 2/18. PLQ Eye Exam: 02/11/18.  CBC and CMP were drawn on 07/02/2019.  She will continue to have CBC and CMP every 5 months to monitor for drug toxicity.  Idiopathic chronic gout of left knee without tophus - [place] proven.She was taken off of Uloric by her nephrologist.  She was started on allopurinol 300 mg daily and continues to take colchicine 0.6 daily.  She has not had any recent gout flares.  She will continue on the current treatment regimen.  We will check a uric acid level today.  She was advised to notify us if she develops signs or symptoms of a gout flare.- Plan: Uric acid  DDD (degenerative disc disease), lumbar - Chronic pain.  She has pain with range of motion.  She is been walking with a cane due to discomfort.  According to the patient she has been evaluated in the emergency department twice in the past month.  She was referred to an orthopedist for further evaluation and treatment  Other medical conditions are listed as follows:   History of cholecystectomy   History of IBS   History of diabetes mellitus  History of asthma   History of lymphadenopathy   History of anxiety   History of diabetic neuropathy   History of kidney stones   History of depression   History of migraine  Orders: Orders Placed This Encounter  Procedures  . C3 and C4  . Sedimentation rate  . Uric acid  . Anti-DNA antibody, double-stranded  . Urinalysis, Routine w reflex microscopic   Meds ordered this encounter  Medications  . hydroxychloroquine (PLAQUENIL) 200 MG tablet    Sig: Take 1 tablet (200 mg total) by mouth 2 (two) times daily.    Dispense:  180 tablet     Refill:  0    Autoimmune Disease   Follow-Up Instructions: Return in about 5 months (around 12/08/2019) for Autoimmune Disease, Gout.   Ofilia Neas, PA-C  Note - This record has been created using [employer].  Chart creation errors have been sought, but may not always  have been located. Such creation errors do not reflect on  the standard of medical care.

## 2019-06-30 ENCOUNTER — Other Ambulatory Visit: Payer: Self-pay | Admitting: Neurology

## 2019-07-02 ENCOUNTER — Other Ambulatory Visit: Payer: Self-pay

## 2019-07-02 ENCOUNTER — Emergency Department (HOSPITAL_COMMUNITY): Payer: Medicare Other

## 2019-07-02 ENCOUNTER — Encounter (HOSPITAL_COMMUNITY): Payer: Self-pay

## 2019-07-02 DIAGNOSIS — Z79899 Other long term (current) drug therapy: Secondary | ICD-10-CM | POA: Diagnosis not present

## 2019-07-02 DIAGNOSIS — I10 Essential (primary) hypertension: Secondary | ICD-10-CM | POA: Insufficient documentation

## 2019-07-02 DIAGNOSIS — E119 Type 2 diabetes mellitus without complications: Secondary | ICD-10-CM | POA: Insufficient documentation

## 2019-07-02 DIAGNOSIS — M5417 Radiculopathy, lumbosacral region: Secondary | ICD-10-CM | POA: Diagnosis not present

## 2019-07-02 DIAGNOSIS — Z794 Long term (current) use of insulin: Secondary | ICD-10-CM | POA: Diagnosis not present

## 2019-07-02 DIAGNOSIS — J45909 Unspecified asthma, uncomplicated: Secondary | ICD-10-CM | POA: Diagnosis not present

## 2019-07-02 DIAGNOSIS — F17210 Nicotine dependence, cigarettes, uncomplicated: Secondary | ICD-10-CM | POA: Diagnosis not present

## 2019-07-02 DIAGNOSIS — M545 Low back pain: Secondary | ICD-10-CM | POA: Diagnosis present

## 2019-07-02 LAB — CBC WITH DIFFERENTIAL/PLATELET
Abs Immature Granulocytes: 0.02 10*3/uL (ref 0.00–0.07)
Basophils Absolute: 0.1 10*3/uL (ref 0.0–0.1)
Basophils Relative: 1 %
Eosinophils Absolute: 0.3 10*3/uL (ref 0.0–0.5)
Eosinophils Relative: 4 %
HCT: 45.2 % (ref 36.0–46.0)
Hemoglobin: 14.3 g/dL (ref 12.0–15.0)
Immature Granulocytes: 0 %
Lymphocytes Relative: 40 %
Lymphs Abs: 3.4 10*3/uL (ref 0.7–4.0)
MCH: 29.4 pg (ref 26.0–34.0)
MCHC: 31.6 g/dL (ref 30.0–36.0)
MCV: 93 fL (ref 80.0–100.0)
Monocytes Absolute: 0.5 10*3/uL (ref 0.1–1.0)
Monocytes Relative: 5 %
Neutro Abs: 4.2 10*3/uL (ref 1.7–7.7)
Neutrophils Relative %: 50 %
Platelets: 192 10*3/uL (ref 150–400)
RBC: 4.86 MIL/uL (ref 3.87–5.11)
RDW: 14 % (ref 11.5–15.5)
WBC: 8.5 10*3/uL (ref 4.0–10.5)
nRBC: 0 % (ref 0.0–0.2)

## 2019-07-02 LAB — COMPREHENSIVE METABOLIC PANEL
ALT: 29 U/L (ref 0–44)
AST: 39 U/L (ref 15–41)
Albumin: 4.5 g/dL (ref 3.5–5.0)
Alkaline Phosphatase: 70 U/L (ref 38–126)
Anion gap: 11 (ref 5–15)
BUN: 15 mg/dL (ref 6–20)
CO2: 21 mmol/L — ABNORMAL LOW (ref 22–32)
Calcium: 9.4 mg/dL (ref 8.9–10.3)
Chloride: 106 mmol/L (ref 98–111)
Creatinine, Ser: 1.2 mg/dL — ABNORMAL HIGH (ref 0.44–1.00)
GFR calc Af Amer: >60 mL/min (ref >60)
GFR calc non Af Amer: 55 mL/min — ABNORMAL LOW (ref >60)
Glucose, Bld: 133 mg/dL — ABNORMAL HIGH (ref 70–99)
Potassium: 4 mmol/L (ref 3.5–5.1)
Sodium: 138 mmol/L (ref 135–145)
Total Bilirubin: 0.4 mg/dL (ref 0.3–1.2)
Total Protein: 7.6 g/dL (ref 6.5–8.1)

## 2019-07-02 LAB — I-STAT BETA HCG BLOOD, ED (NOT ORDERABLE): I-stat hCG, quantitative: <5 m[IU]/mL (ref <5)

## 2019-07-02 NOTE — ED Triage Notes (Signed)
BIB EMS Pt reports was getting out of car and felt a "pop" at about 1700 and has been having lower back pain and numbness in left foot and great toe and not able to move since. Not ambulatory upon arrival.

## 2019-07-02 NOTE — ED Notes (Signed)
Patient refused chest XR.

## 2019-07-02 NOTE — ED Notes (Signed)
Pt requested to speak with this RN. She expressed dissatisfaction with wait time. I assured her that we are working as fast as we can. Pt chair was leaned back and she has been given a pillow for comfort. She was also provided with some ice water and I plugged in her phone charger as she requested. Safety interventions in place and patient allowed to stay in triage room to wait for a treatment room in stead of the lobby d/t her pain. VSS.

## 2019-07-03 ENCOUNTER — Emergency Department (HOSPITAL_COMMUNITY)
Admission: EM | Admit: 2019-07-03 | Discharge: 2019-07-03 | Disposition: A | Discharge status: Home / Self Care | Payer: Medicare Other | Attending: Emergency Medicine | Admitting: Emergency Medicine

## 2019-07-03 DIAGNOSIS — M5417 Radiculopathy, lumbosacral region: Secondary | ICD-10-CM | POA: Diagnosis not present

## 2019-07-03 MED ORDER — METHOCARBAMOL 500 MG PO TABS: 500.0000 mg | ORAL_TABLET | Freq: Three times a day (TID) | ORAL | 0 refills | Status: DC | PRN

## 2019-07-03 MED ORDER — DEXAMETHASONE SODIUM PHOSPHATE 10 MG/ML IJ SOLN
10.0000 mg | Freq: Once | INTRAMUSCULAR | Status: AC
  Administered 2019-07-03: 03:00:00 10 mg via INTRAVENOUS
  Filled 2019-07-03: qty 1

## 2019-07-03 MED ORDER — ONDANSETRON HCL 4 MG/2ML IJ SOLN
4.0000 mg | Freq: Once | INTRAMUSCULAR | Status: AC
  Administered 2019-07-03: 4 mg via INTRAVENOUS
  Filled 2019-07-03: qty 2

## 2019-07-03 MED ORDER — METHOCARBAMOL 1000 MG/10ML IJ SOLN: 500.0000 mg | Freq: Once | INTRAMUSCULAR | Status: DC

## 2019-07-03 MED ORDER — HYDROMORPHONE HCL 1 MG/ML IJ SOLN
1.0000 mg | Freq: Once | INTRAMUSCULAR | Status: AC
  Administered 2019-07-03: 04:00:00 1 mg via INTRAVENOUS
  Filled 2019-07-03: qty 1

## 2019-07-03 MED ORDER — SODIUM CHLORIDE 0.9 % IV BOLUS (SEPSIS)
1000.0000 mL | Freq: Once | INTRAVENOUS | Status: AC
  Administered 2019-07-03: 1000 mL via INTRAVENOUS

## 2019-07-03 MED ORDER — HYDROMORPHONE HCL 1 MG/ML IJ SOLN
1.0000 mg | Freq: Once | INTRAMUSCULAR | Status: AC
  Administered 2019-07-03: 1 mg via INTRAVENOUS
  Filled 2019-07-03: qty 1

## 2019-07-03 MED ORDER — METHOCARBAMOL 1000 MG/10ML IJ SOLN
500.0000 mg | Freq: Once | INTRAVENOUS | Status: AC
  Administered 2019-07-03: 04:00:00 500 mg via INTRAVENOUS
  Filled 2019-07-03: qty 500

## 2019-07-03 NOTE — ED Notes (Signed)
PT exit rm upset due to mother being outside waiting. PT state been waiting for discharge paper for awhile and is leaving with IV in arm. Info PT IV have to be out before leaving hospital. PT request for IV to be remove so she could leave. Info PT RN currently assisting someone will be in to discharge when able. PT still request IV out. RN info. IV out of PT Right Arm

## 2019-07-03 NOTE — Discharge Instructions (Signed)
We have given you a dose of IV steroids here in the emergency department.  I recommend over the next week that you watch your blood sugar closely.  This was given to you to help with any inflammation that could be causing your symptoms today.  Please continue your pain medication as prescribed.  We are also discharging you with a prescription of muscle relaxers.  Please do not take oxycodone and methocarbamol at the same time.  I recommend that you do not drive while taking pain medication and muscle relaxers.

## 2019-07-03 NOTE — ED Provider Notes (Signed)
TIME SEEN: 1:38 AM  CHIEF COMPLAINT: Back pain  HPI: Patient is a 43 year old female with history of asthma, hypertension, diabetes, hyperlipidemia, chronic pain secondary to neuropathy who presents to the emergency department with back pain.  States she was getting out of her car tonight and felt something in her left lower back "pop" and states that she was unable to walk without significant pain.  States she is having numbness in the bottom of the lateral left foot.  No focal weakness.  No bowel or bladder incontinence.  No fever.  No previous back surgeries or epidural injections.  ROS: See HPI Constitutional: no fever  Eyes: no drainage  ENT: no runny nose   Cardiovascular:  no chest pain  Resp: no SOB  GI: no vomiting GU: no dysuria Integumentary: no rash  Allergy: no hives  Musculoskeletal: no leg swelling  Neurological: no slurred speech ROS otherwise negative  PAST MEDICAL HISTORY/PAST SURGICAL HISTORY:  Past Medical History:  Diagnosis Date  . Anxiety   . Asthma   . Cellulitis 08/2018   Right leg, hospitalized for a week   . Depression   . Diabetes mellitus without complication ([place])   . Hyperlipemia   . Hypertension   . Renal disorder   . Sepsis ([place]) 08/2018   hospitalized in ICU     MEDICATIONS:  Prior to Admission medications   Medication Sig Start Date End Date Taking? Authorizing Provider  acyclovir (ZOVIRAX) 200 MG capsule Take 200 mg by mouth daily.     [provider]  allopurinol (ZYLOPRIM) 300 MG tablet Take 1 tablet (300 mg total) by mouth daily. 01/07/19   Ofilia Neas, PA-C  amitriptyline (ELAVIL) 50 MG tablet Take 50 mg by mouth daily.  10/12/15   [provider]  atenolol (TENORMIN) 100 MG tablet Take 100 mg by mouth daily.  03/13/17   [provider]  budesonide (PULMICORT) 0.5 MG/2ML nebulizer solution Take 0.5 mg by nebulization daily.  01/15/18   [provider]  Colchicine 0.6 MG CAPS TAKE 1 CAPSULE BY MOUTH   DAILY. MAY TAKE 1 CAPSULE 2 TIMES DAILY DURING FLARE 03/16/19   Bo Merino, MD  cyclobenzaprine (FLEXERIL) 10 MG tablet Take 10 mg by mouth 2 (two) times daily as needed.     [provider]  dicyclomine (BENTYL) 10 MG capsule Take 10 mg by mouth 4 (four) times daily -  before meals and at bedtime.    [provider]  Erenumab-aooe (AIMOVIG) 140 MG/ML SOAJ Inject 140 mg into the skin every 30 (thirty) days. Patient not taking: Reported on 01/07/2019 10/29/18   Melvenia Beam, MD  fluticasone [facility]) 50 MCG/ACT nasal spray daily.    [provider]  gabapentin (NEURONTIN) 600 MG tablet TAKE ONE TABLET BY MOUTH 4 TIMES DAILY 05/29/18   Kathrynn Ducking, MD  HUMALOG MIX 75/25 (75-25) 100 UNIT/ML SUSP injection Inject 8-11 Units into the skin 3 (three) times daily with meals. 08/25/18   [provider]  hydroxychloroquine (PLAQUENIL) 200 MG tablet TAKE 1 TABLET BY MOUTH TWO  TIMES DAILY 05/29/19   Bo Merino, MD  hydrOXYzine (VISTARIL) 50 MG capsule as needed.  03/25/17   [provider]  LANTUS 100 UNIT/ML injection Inject 25 Units into the skin daily. 09/30/18   [provider]  LATUDA 40 MG TABS tablet Take 40 mg by mouth daily with breakfast.  10/11/15   [provider]  levothyroxine (SYNTHROID, LEVOTHROID) 300 MCG tablet Take 300  mcg by mouth daily before breakfast. 10/01/18   [provider]  magnesium 30 MG tablet Take 30 mg by mouth 2 (two) times daily.    [provider]  metFORMIN (GLUCOPHAGE) 1000 MG tablet Take 1,000 mg by mouth daily with breakfast.    [provider]  mirabegron ER (MYRBETRIQ) 50 MG TB24 tablet Take 50 mg by mouth daily. 07/04/17   [provider]  montelukast (SINGULAIR) 10 MG tablet Take 10 mg by mouth at bedtime.    [provider]  Multiple Vitamins-Minerals (CENTRUM WOMEN PO) Take 1 tablet by mouth daily.    [provider]  omeprazole  (PRILOSEC) 20 MG capsule Take 20 mg by mouth at bedtime.  03/20/17   [provider]  ONE TOUCH ULTRA TEST test strip  01/29/18   [provider]  ONETOUCH DELICA LANCETS 60F MISC  01/28/18   [provider]  oxyCODONE-acetaminophen (PERCOCET) 10-325 MG tablet Take 1 tablet by mouth 4 (four) times daily.  10/12/15   [provider]  PARoxetine (PAXIL) 20 MG tablet Take 20 mg by mouth daily. Takes with 40 mg for total of 60 mg daily.    [provider]  PARoxetine (PAXIL) 40 MG tablet Take 40 mg by mouth daily. 10/28/18   [provider]  potassium citrate (UROCIT-K) 10 MEQ (1080 MG) SR tablet Take 3 tablets by mouth 2 (two) times daily. 08/30/17   [provider]  promethazine (PHENERGAN) 25 MG tablet TAKE 1 TABLET BY MOUTH EVEVRY 6 HOURS ASNEEDED FOR NAUSEA AND VOMITING. 12/22/18   Melvenia Beam, MD  rizatriptan (MAXALT) 10 MG tablet Take 1 tablet (10 mg total) by mouth as needed for migraine. May repeat in 2 hours if needed. Maximum 2 tabs in 24 hours. 01/21/18   Melvenia Beam, MD  rosuvastatin (CRESTOR) 10 MG tablet Take 10 mg by mouth daily. 03/25/19   [provider]  topiramate (TOPAMAX) 50 MG tablet TAKE 4 TABLETS BY MOUTH IN  THE EVENING. 06/30/19   Lomax, Amy, NP  VENTOLIN HFA 108 (90 BASE) MCG/ACT inhaler Inhale 2 puffs into the lungs every 6 (six) hours as needed for shortness of breath.  11/04/15   [provider]    ALLERGIES:  Allergies  Allergen Reactions  . Sulfonamide Derivatives Anaphylaxis  . Erythromycin Hives  . Ibuprofen Other (See Comments)    Dark, tarry emesis and stool. Per pt "vomiting blood"  . Mushroom Extract Complex Hives  . Toradol [Ketorolac Tromethamine] Other (See Comments)    Dark tarry emesis and stool Per pt "vomiting blood"  . Varenicline     Other reaction(s): Mental Status Changes (intolerance) Suicidal ideation  . Azithromycin Rash    SOCIAL HISTORY:  Social History    Tobacco Use  . Smoking status: Current Every Day Smoker    Packs/day: 1.00    Years: 25.00    Pack years: 25.00    Types: Cigarettes  . Smokeless tobacco: Never Used  Substance Use Topics  . Alcohol use: No    FAMILY HISTORY: Family History  Problem Relation Age of Onset  . Retinal detachment Sister   . Asthma Son   . Asthma Daughter   . Seizures Neg Hx   . Migraines Neg Hx     EXAM: BP (!) 107/54 (BP Location: Left Arm)   Pulse 76   Temp 98.2 F (36.8 C) (Oral)   Resp 18   SpO2 95%  CONSTITUTIONAL: Alert and oriented and  responds appropriately to questions.  Appears uncomfortable, lying on her right side and unable to sit up or move without significant pain HEAD: Normocephalic EYES: Conjunctivae clear, pupils appear equal, EOMI ENT: normal nose; moist mucous membranes NECK: Supple, no meningismus, no nuchal rigidity, no LAD  CARD: RRR; S1 and S2 appreciated; no murmurs, no clicks, no rubs, no gallops RESP: Normal chest excursion without splinting or tachypnea; breath sounds clear and equal bilaterally; no wheezes, no rhonchi, no rales, no hypoxia or respiratory distress, speaking full sentences ABD/GI: Normal bowel sounds; non-distended; soft, non-tender, no rebound, no guarding, no peritoneal signs, no hepatosplenomegaly BACK:  The back appears normal and is tender to palpation over the lower lumbar spine without step-off or deformity, tender over the left paraspinal muscles and over the left buttock, no redness or warmth, no ecchymosis or swelling, no rash or other lesions EXT: Normal ROM in all joints; non-tender to palpation; no edema; normal capillary refill; no cyanosis, no calf tenderness or swelling, 2+ DP pulses bilaterally    SKIN: Normal color for age and race; warm; no rash NEURO: Moves all extremities equally, difficult to assess strength at this time given patient has a difficult time lying flat on her back, unable to assess for positive straight leg raise,  reports normal sensation in the right leg, arms and face, decreased sensation in the sole of the left lateral foot, no saddle anesthesia PSYCH: The patient's mood and manner are appropriate. Grooming and personal hygiene are appropriate.  MEDICAL DECISION MAKING: Patient here with symptoms of radiculopathy.  Will give dose of IV Decadron, Dilaudid for pain control.  She reports she cannot take NSAIDs secondary to history of hematemesis and melena.  No red flag symptoms to suggest cauda equina, epidural abscess or hematoma, discitis or osteomyelitis, MS, transverse myelitis.  I do not feel she needs emergent imaging of her back at this time.  Patient verbalized understanding.  Labs in triage are unremarkable.  ED PROGRESS: Patient continues to have back pain despite 1 round of Dilaudid.  She feels that she is spasming.  Requesting something else for pain.  Will give Dilaudid and Robaxin and reassess.  5:10 AM  Pt reports pain has significantly improved.  Will ambulate patient in the ED.  She has had some slightly low oxygen saturations after 2 rounds of pain medication.  We will monitor this closely.  She is drowsy but easily arousable.  No apnea.  She denies shortness of breath.  Lungs are clear.  6:00 AM  Pt able to ambulate using a cane without difficulty and no hypoxia.  Will discharge home with prescription of Robaxin.  She has Percocet at home from her pain management specialist.  Discussed return precautions and recommended PCP follow-up if symptoms not improving.   At this time, I do not feel there is any life-threatening condition present. I have reviewed and discussed all results (EKG, imaging, lab, urine as appropriate) and exam findings with patient/family. I have reviewed nursing notes and appropriate previous records.  I feel the patient is safe to be discharged home without further emergent workup and can continue workup as an outpatient as needed. Discussed usual and customary return  precautions. Patient/family verbalize understanding and are comfortable with this plan.  Outpatient follow-up has been provided as needed. All questions have been answered.      , Delice Bison, DO 07/03/19 [phone]

## 2019-07-08 ENCOUNTER — Encounter: Payer: Self-pay | Admitting: Physician Assistant

## 2019-07-08 ENCOUNTER — Ambulatory Visit (INDEPENDENT_AMBULATORY_CARE_PROVIDER_SITE_OTHER): Payer: Medicare Other | Admitting: Physician Assistant

## 2019-07-08 ENCOUNTER — Other Ambulatory Visit: Payer: Self-pay

## 2019-07-08 VITALS — BP 117/82 | HR 90 | Resp 13 | Ht 68.0 in | Wt 302.4 lb

## 2019-07-08 DIAGNOSIS — M359 Systemic involvement of connective tissue, unspecified: Secondary | ICD-10-CM

## 2019-07-08 DIAGNOSIS — Z8719 Personal history of other diseases of the digestive system: Secondary | ICD-10-CM

## 2019-07-08 DIAGNOSIS — M5136 Other intervertebral disc degeneration, lumbar region: Secondary | ICD-10-CM

## 2019-07-08 DIAGNOSIS — Z8669 Personal history of other diseases of the nervous system and sense organs: Secondary | ICD-10-CM

## 2019-07-08 DIAGNOSIS — Z79899 Other long term (current) drug therapy: Secondary | ICD-10-CM | POA: Diagnosis not present

## 2019-07-08 DIAGNOSIS — Z87442 Personal history of urinary calculi: Secondary | ICD-10-CM

## 2019-07-08 DIAGNOSIS — Z9049 Acquired absence of other specified parts of digestive tract: Secondary | ICD-10-CM

## 2019-07-08 DIAGNOSIS — Z8639 Personal history of other endocrine, nutritional and metabolic disease: Secondary | ICD-10-CM

## 2019-07-08 DIAGNOSIS — M1A0620 Idiopathic chronic gout, left knee, without tophus (tophi): Secondary | ICD-10-CM | POA: Diagnosis not present

## 2019-07-08 DIAGNOSIS — Z87898 Personal history of other specified conditions: Secondary | ICD-10-CM

## 2019-07-08 DIAGNOSIS — Z8659 Personal history of other mental and behavioral disorders: Secondary | ICD-10-CM

## 2019-07-08 DIAGNOSIS — Z8709 Personal history of other diseases of the respiratory system: Secondary | ICD-10-CM

## 2019-07-08 MED ORDER — HYDROXYCHLOROQUINE SULFATE 200 MG PO TABS: 200.0000 mg | ORAL_TABLET | Freq: Two times a day (BID) | ORAL | 0 refills | Status: DC

## 2019-07-09 LAB — URINALYSIS, ROUTINE W REFLEX MICROSCOPIC
Bacteria, UA: NONE SEEN /HPF
Bilirubin Urine: NEGATIVE
Glucose, UA: NEGATIVE
Hgb urine dipstick: NEGATIVE
Hyaline Cast: NONE SEEN /LPF
Ketones, ur: NEGATIVE
Nitrite: NEGATIVE
Protein, ur: NEGATIVE
RBC / HPF: NONE SEEN /HPF (ref 0–2)
Specific Gravity, Urine: 1.019 (ref 1.001–1.03)
pH: 7 (ref 5.0–8.0)

## 2019-07-09 LAB — C3 AND C4
C3 Complement: 184 mg/dL (ref 83–193)
C4 Complement: 43 mg/dL (ref 15–57)

## 2019-07-09 LAB — ANTI-DNA ANTIBODY, DOUBLE-STRANDED: ds DNA Ab: <1 IU/mL

## 2019-07-09 LAB — SEDIMENTATION RATE: Sed Rate: 14 mm/h (ref 0–20)

## 2019-07-09 LAB — URIC ACID: Uric Acid, Serum: 5.2 mg/dL (ref 2.5–7.0)

## 2019-07-09 NOTE — Progress Notes (Signed)
Complements WNL.  Sed rate WNL.  Uric acid is within desirable range.  UA revealed trace leukocytes-please notify patient and if she is symptomatic please advise patient to follow up with PCP.

## 2019-07-09 NOTE — Progress Notes (Signed)
DsDNA negative.

## 2019-07-13 ENCOUNTER — Telehealth: Payer: Self-pay | Admitting: Rheumatology

## 2019-07-13 NOTE — Telephone Encounter (Signed)
Riley Nearing, Nurse Practitioner with [facility] left a voicemail stating she had a wellness visit with the patient.  [place] states patient was seen by Dr. Estanislado Pandy on 07/08/19 and had urine checked which showed Leukocytes.  [place] states patient has been having urinary symptoms since that appointment and is checking if Dr. Estanislado Pandy could prescribe an antibiotic since her urine was tested.   Loreli Slot states if you have any questions, she can be reached at [phone] or you can send the prescription to [facility].

## 2019-07-13 NOTE — Telephone Encounter (Signed)
Attempted to contact [place] not setup unable to leave a message.

## 2019-07-14 ENCOUNTER — Telehealth: Payer: Self-pay | Admitting: Rheumatology

## 2019-07-14 NOTE — Telephone Encounter (Signed)
Tammy with [employer] calling on behalf of patient. Patient had a urine screen recently, and is very symptomatic for a UTI. Patient is very anxious in regards to her symptoms, and [employer] is requesting treatment for patient's symptoms either from Dr. Estanislado Pandy, or Dr. Volney American. [employer] requesting you call patient to advise.

## 2019-07-14 NOTE — Telephone Encounter (Signed)
Left message for Riley Nearing, NP to advise that Dr. Estanislado Pandy does not prescribe antibiotics for Urinary symptoms. Advised her that she would need to contact the patient's PCP.

## 2019-07-14 NOTE — Telephone Encounter (Signed)
Contacted patient and advised patient she would need to follow up with her PCP. Patient states she "you did my urine sample and and the nurse call me with the results so you should send in the antibiotics." Patient advised we do not send in antibiotics. We have patient's follow up with PCP as we advised her on 07/09/19. Patient advised we have sent a copy of the results to her PCP and that we would re-fax a copy today to ensure they received the results.

## 2019-07-17 ENCOUNTER — Other Ambulatory Visit: Payer: Self-pay | Admitting: Physician Assistant

## 2019-07-17 ENCOUNTER — Other Ambulatory Visit: Payer: Self-pay | Admitting: Rheumatology

## 2019-07-17 NOTE — Telephone Encounter (Signed)
Last Visit: 07/08/19 Next Visit: due January 2021. Message sent to the front to schedule.  Labs: 07/02/19 CO2 Glucose 133 Creat 1.20 GFR 55  Okay to refill per Dr. Estanislado Pandy

## 2019-07-17 NOTE — Telephone Encounter (Signed)
Please schedule patient a follow up visit. Patient due January 2021. Thanks!

## 2019-08-26 ENCOUNTER — Other Ambulatory Visit: Payer: Self-pay | Admitting: Physician Assistant

## 2019-08-26 DIAGNOSIS — M359 Systemic involvement of connective tissue, unspecified: Secondary | ICD-10-CM

## 2019-09-14 ENCOUNTER — Institutional Professional Consult (permissible substitution): Payer: Medicare Other | Admitting: Neurology

## 2019-09-15 ENCOUNTER — Other Ambulatory Visit: Payer: Self-pay | Admitting: Neurology

## 2019-10-07 ENCOUNTER — Other Ambulatory Visit: Payer: Self-pay | Admitting: Rheumatology

## 2019-10-07 NOTE — Telephone Encounter (Signed)
Last Visit: 07/08/19 Next Visit: 12/11/19  Labs: 07/02/19 CO2 Glucose 133 Creat 1.20 GFR 55  Okay to refill per Dr. Estanislado Pandy
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
## 2019-10-21 ENCOUNTER — Other Ambulatory Visit: Payer: Self-pay

## 2019-10-21 ENCOUNTER — Ambulatory Visit (INDEPENDENT_AMBULATORY_CARE_PROVIDER_SITE_OTHER): Payer: Medicare Other | Admitting: Neurology

## 2019-10-21 ENCOUNTER — Encounter: Payer: Self-pay | Admitting: Neurology

## 2019-10-21 VITALS — BP 102/72 | HR 72 | Temp 96.9°F | Ht 68.0 in | Wt 308.0 lb

## 2019-10-21 DIAGNOSIS — R251 Tremor, unspecified: Secondary | ICD-10-CM

## 2019-10-21 DIAGNOSIS — R269 Unspecified abnormalities of gait and mobility: Secondary | ICD-10-CM | POA: Diagnosis not present

## 2019-10-21 DIAGNOSIS — R253 Fasciculation: Secondary | ICD-10-CM

## 2019-10-21 DIAGNOSIS — R2689 Other abnormalities of gait and mobility: Secondary | ICD-10-CM

## 2019-10-21 NOTE — Patient Instructions (Addendum)
Tremor: Occupational therapy. May consider weighted silverware and weight wrist bands. Discuss with pcp medications that can cause tremor.  Right carpal Tunnel: Wrist splint, conservative measures. If continues ask Dr. Sheryle Hail to send you to a hand specialist. Leg/muscle twitching at night: Stretching exercises prior to bed will send to PT, hydrate well Imbalance: PT Tremors: Recommend decreasing medications that cn cause tremors, you are on multiple of them. Also see tremor compensation strategies below. Will send to OT.   Compensation Strategies for Tremors  When eating, try the following  Eat out of bowls, divided plates, or use a plate guard (available at a medical supply store) and eat with a spoon so that you have an edge to scoop up food.  Try raising your plate so that there is less distance between the plate and mouth.Try stabilizing elbows on the tables or against your body.  Use utensil with built-up/larger grips as they are easier to hold.  When writing, try the following:  Stabilize forearm on the table.  Take your time as rushing/being stressed can increase tremors.  Try a felt-tipped pen, it does not glide as much.  Avoid gel pens ( they move to much ).  Consider using pre-printed labels with your name and address (carry them with you when you go out) or you can get stamps with your address or signature on it.  Use a small tape recorder to record messages/reminders for yourself.  Use pens with bigger grips.  When brushing your teeth, putting on make-up, or styling hair, try the following:  Use an electric toothbrush.  Use items with built-up grips.  Stabilize your elbows against your body or on the counter.  Use long-handled brushes/combs.  Use a hair dryer with a stand.  In general:  Avoid stress, fatigue or rushing as this can increase tremors.  Sit down for activities that require more control/coordination.  Perform "flicks".  Tremor A tremor is  trembling or shaking that you cannot control. Most tremors affect the hands or arms. Tremors can also affect the head, vocal cords, face, and other parts of the body. There are many types of tremors. Common types include:  Essential tremor. These usually occur in people older than 40. It may run in families and can happen in otherwise healthy people.  Resting tremor. These occur when the muscles are at rest, such as when your hands are resting in your lap. People with Parkinson's disease often have resting tremors.  Postural tremor. These occur when you try to hold a pose, such as keeping your hands outstretched.  Kinetic tremor. These occur during purposeful movement, such as trying to touch a finger to your nose.  Task-specific tremor. These may occur when you perform certain tasks such as writing, speaking, or standing.  Psychogenic tremor. These dramatically lessen or disappear when you are distracted. They can happen in people of all ages. Some types of tremors have no known cause. Tremors can also be a symptom of nervous system problems (neurological disorders) that may occur with aging. Some tremors go away with treatment, while others do not. Follow these instructions at home: Lifestyle      Limit alcohol intake to no more than 1 drink a day for nonpregnant women and 2 drinks a day for men. One drink equals 12 oz of beer, 5 oz of wine, or 1 oz of hard liquor.  Do not use any products that contain nicotine or tobacco, such as cigarettes and e-cigarettes. If you need help quitting,  ask your health care provider.  Avoid extreme heat and extreme cold.  Limit your caffeine intake, as told by your health care provider.  Try to get 8 hours of sleep each night.  Find ways to manage your stress, such as meditation or yoga. General instructions  Take over-the-counter and prescription medicines only as told by your health care provider.  Keep all follow-up visits as told by your  health care provider. This is important. Contact a health care provider if you:  Develop a tremor after starting a new medicine.  Have a tremor along with other symptoms such as: ? Numbness. ? Tingling. ? Pain. ? Weakness.  Notice that your tremor gets worse.  Notice that your tremor interferes with your day-to-day life. Summary  A tremor is trembling or shaking that you cannot control.  Most tremors affect the hands or arms.  Some types of tremors have no known cause. Others may be a symptom of nervous system problems (neurological disorders).  Make sure you discuss any tremors you have with your health care provider. This information is not intended to replace advice given to you by your health care provider. Make sure you discuss any questions you have with your health care provider. Document Released: 10/26/2002 Document Revised: 10/18/2017 Document Reviewed: 09/05/2017 Elsevier Patient Education  2020 [place] in the Home, Adult Falls can cause injuries. They can happen to people of all ages. There are many things you can do to make your home safe and to help prevent falls. Ask for help when making these changes, if needed. What actions can I take to prevent falls? General Instructions  Use good lighting in all rooms. Replace any light bulbs that burn out.  Turn on the lights when you go into a dark area. Use night-lights.  Keep items that you use often in easy-to-reach places. Lower the shelves around your home if necessary.  Set up your furniture so you have a clear path. Avoid moving your furniture around.  Do not have throw rugs and other things on the floor that can make you trip.  Avoid walking on wet floors.  If any of your floors are uneven, fix them.  Add color or contrast paint or tape to clearly mark and help you see: ? Any grab bars or handrails. ? First and last steps of stairways. ? Where the edge of each step is.  If you  use a stepladder: ? Make sure that it is fully opened. Do not climb a closed stepladder. ? Make sure that both sides of the stepladder are locked into place. ? Ask someone to hold the stepladder for you while you use it.  If there are any pets around you, be aware of where they are. What can I do in the bathroom?      Keep the floor dry. Clean up any water that spills onto the floor as soon as it happens.  Remove soap buildup in the tub or shower regularly.  Use non-skid mats or decals on the floor of the tub or shower.  Attach bath mats securely with double-sided, non-slip rug tape.  If you need to sit down in the shower, use a plastic, non-slip stool.  Install grab bars by the toilet and in the tub and shower. Do not use towel bars as grab bars. What can I do in the bedroom?  Make sure that you have a light by your bed that is easy to reach.  Do not use any sheets or blankets that are too big for your bed. They should not hang down onto the floor.  Have a firm chair that has side arms. You can use this for support while you get dressed. What can I do in the kitchen?  Clean up any spills right away.  If you need to reach something above you, use a strong step stool that has a grab bar.  Keep electrical cords out of the way.  Do not use floor polish or wax that makes floors slippery. If you must use wax, use non-skid floor wax. What can I do with my stairs?  Do not leave any items on the stairs.  Make sure that you have a light switch at the top of the stairs and the bottom of the stairs. If you do not have them, ask someone to add them for you.  Make sure that there are handrails on both sides of the stairs, and use them. Fix handrails that are broken or loose. Make sure that handrails are as long as the stairways.  Install non-slip stair treads on all stairs in your home.  Avoid having throw rugs at the top or bottom of the stairs. If you do have throw rugs, attach  them to the floor with carpet tape.  Choose a carpet that does not hide the edge of the steps on the stairway.  Check any carpeting to make sure that it is firmly attached to the stairs. Fix any carpet that is loose or worn. What can I do on the outside of my home?  Use bright outdoor lighting.  Regularly fix the edges of walkways and driveways and fix any cracks.  Remove anything that might make you trip as you walk through a door, such as a raised step or threshold.  Trim any bushes or trees on the path to your home.  Regularly check to see if handrails are loose or broken. Make sure that both sides of any steps have handrails.  Install guardrails along the edges of any raised decks and porches.  Clear walking paths of anything that might make someone trip, such as tools or rocks.  Have any leaves, snow, or ice cleared regularly.  Use sand or salt on walking paths during winter.  Clean up any spills in your garage right away. This includes grease or oil spills. What other actions can I take?  Wear shoes that: ? Have a low heel. Do not wear high heels. ? Have rubber bottoms. ? Are comfortable and fit you well. ? Are closed at the toe. Do not wear open-toe sandals.  Use tools that help you move around (mobility aids) if they are needed. These include: ? Canes. ? Walkers. ? Scooters. ? Crutches.  Review your medicines with your doctor. Some medicines can make you feel dizzy. This can increase your chance of falling. Ask your doctor what other things you can do to help prevent falls. Where to find more information  Centers for Disease Control and Prevention, STEADI: https://garcia.biz/  [employer] on Aging: BrainJudge.co.uk Contact a doctor if:  You are afraid of falling at home.  You feel weak, drowsy, or dizzy at home.  You fall at home. Summary  There are many simple things that you can do to make your home safe and to help prevent falls.   Ways to make your home safe include removing tripping hazards and installing grab bars in the bathroom.  Ask for help when  making these changes in your home. This information is not intended to replace advice given to you by your health care provider. Make sure you discuss any questions you have with your health care provider. Document Released: 09/01/2009 Document Revised: 02/26/2019 Document Reviewed: 06/20/2017 Elsevier Patient Education  2020 [place] tunnel syndrome is a condition that causes pain in your hand and arm. The carpal tunnel is a narrow area located on the palm side of your wrist. Repeated wrist motion or certain diseases may cause swelling within the tunnel. This swelling pinches the main nerve in the wrist (median nerve). What are the causes? This condition may be caused by:  Repeated wrist motions.  Wrist injuries.  Arthritis.  A cyst or tumor in the carpal tunnel.  Fluid buildup during pregnancy. Sometimes the cause of this condition is not known. What increases the risk? The following factors may make you more likely to develop this condition:  Having a job, such as being a Research scientist (life sciences), that requires you to repeatedly move your wrist in the same motion.  Being a woman.  Having certain conditions, such as: ? Diabetes. ? Obesity. ? An underactive thyroid (hypothyroidism). ? Kidney failure. What are the signs or symptoms? Symptoms of this condition include:  A tingling feeling in your fingers, especially in your thumb, index, and middle fingers.  Tingling or numbness in your hand.  An aching feeling in your entire arm, especially when your wrist and elbow are bent for a long time.  Wrist pain that goes up your arm to your shoulder.  Pain that goes down into your palm or fingers.  A weak feeling in your hands. You may have trouble grabbing and holding items. Your symptoms may feel worse during the  night. How is this diagnosed? This condition is diagnosed with a medical history and physical exam. You may also have tests, including:  Electromyogram (EMG). This test measures electrical signals sent by your nerves into the muscles.  Nerve conduction study. This test measures how well electrical signals pass through your nerves.  Imaging tests, such as X-rays, ultrasound, and MRI. These tests check for possible causes of your condition. How is this treated? This condition may be treated with:  Lifestyle changes. It is important to stop or change the activity that caused your condition.  Doing exercise and activities to strengthen your muscles and bones (physical therapy).  Learning how to use your hand again after diagnosis (occupational therapy).  Medicines for pain and inflammation. This may include medicine that is injected into your wrist.  A wrist splint.  Surgery. Follow these instructions at home: If you have a splint:  Wear the splint as told by your health care provider. Remove it only as told by your health care provider.  Loosen the splint if your fingers tingle, become numb, or turn cold and blue.  Keep the splint clean.  If the splint is not waterproof: ? Do not let it get wet. ? Cover it with a watertight covering when you take a bath or shower. Managing pain, stiffness, and swelling   If directed, put ice on the painful area: ? If you have a removable splint, remove it as told by your health care provider. ? Put ice in a plastic bag. ? Place a towel between your skin and the bag. ? Leave the ice on for 20 minutes, 2-3 times per day. General instructions  Take over-the-counter and prescription medicines  only as told by your health care provider.  Rest your wrist from any activity that may be causing your pain. If your condition is work related, talk with your employer about changes that can be made, such as getting a wrist pad to use while typing.  Do  any exercises as told by your health care provider, physical therapist, or occupational therapist.  Keep all follow-up visits as told by your health care provider. This is important. Contact a health care provider if:  You have new symptoms.  Your pain is not controlled with medicines.  Your symptoms get worse. Get help right away if:  You have severe numbness or tingling in your wrist or hand. Summary  Carpal tunnel syndrome is a condition that causes pain in your hand and arm.  It is usually caused by repeated wrist motions.  Lifestyle changes and medicines are used to treat carpal tunnel syndrome. Surgery may be recommended.  Follow your health care provider's instructions about wearing a splint, resting from activity, keeping follow-up visits, and calling for help. This information is not intended to replace advice given to you by your health care provider. Make sure you discuss any questions you have with your health care provider. Document Released: 11/02/2000 Document Revised: 03/14/2018 Document Reviewed: 03/14/2018 Elsevier Patient Education  2020 [employer].

## 2019-10-27 ENCOUNTER — Other Ambulatory Visit: Payer: Self-pay | Admitting: Physician Assistant

## 2019-10-27 DIAGNOSIS — M359 Systemic involvement of connective tissue, unspecified: Secondary | ICD-10-CM

## 2019-10-27 NOTE — Telephone Encounter (Signed)
Last Visit: 07/08/2019 Next Visit: 12/11/2019 Labs: 07/23/2019 Eye exam: 02/11/2018  Attempted to contact patient and left message on machine to advise patient we need updated PLQ eye exam.   Okay to refill 30 day supply of PLQ?

## 2019-10-27 NOTE — Telephone Encounter (Signed)
ok 

## 2019-10-28 ENCOUNTER — Other Ambulatory Visit: Payer: Self-pay | Admitting: Neurology

## 2019-11-19 ENCOUNTER — Other Ambulatory Visit: Payer: Self-pay | Admitting: Neurology

## 2019-12-11 ENCOUNTER — Ambulatory Visit: Payer: Medicare Other | Admitting: Rheumatology

## 2019-12-12 ENCOUNTER — Telehealth: Payer: Self-pay | Admitting: Physician Assistant

## 2019-12-12 NOTE — Telephone Encounter (Signed)
I returned the patient's call this morning regarding the flare she is experiencing.  She is having pain and swelling in multiple joints including both hands, wrist joints, and both knee joints.  She woke up in severe pain this morning.  She requested a prednisone taper.  I called [facility] [phone]) and gave a verbal order for a prednisone taper starting at 20 mg tapering by 5 mg every 4 days.    I advised the patient to schedule an appointment next week if her symptoms persist or worsen.  All questions were addressed.   Hazel Sams, PA-C

## 2019-12-14 NOTE — Telephone Encounter (Signed)
Called to check on patient and patient states she is doing better than she was on Saturday. Patient states the prednisone is helping. Advised patient to call the office if symptoms persist or worsen, patient verbalized understanding.  Hazel Sams, PA-C is aware.

## 2019-12-29 ENCOUNTER — Other Ambulatory Visit: Payer: Self-pay | Admitting: Rheumatology

## 2019-12-29 DIAGNOSIS — M359 Systemic involvement of connective tissue, unspecified: Secondary | ICD-10-CM

## 2019-12-29 NOTE — Telephone Encounter (Signed)
Last Visit: 07/08/2019 Next Visit: 01/19/2020 Labs: 07/23/19 Chloride 21, Glucose 144, GFR 57  PLQ Eye Exam: 02/11/18 WNL  Patient advised she is due to update labs and PLQ eye exam. Patient will call PCP and have them fax results from recent lab work. Patient states she will call and schedule PLQ eye exam.   Okay to refill 30 day PLQ, Allopurinol and Colchicine?

## 2019-12-29 NOTE — Telephone Encounter (Signed)
Ok to refill 30 day supply but we will not be able to continue to refill PLQ if she does not update PLQ eye exam.  Please have patient schedule an urgent appointment.

## 2020-01-14 NOTE — Progress Notes (Deleted)
Office Visit Note  Patient: Lynn Ponce             Date of Birth: February 25, 1976           MRN: [id number]             PCP: Antonietta Jewel, MD Referring: Antonietta Jewel, MD Visit Date: 01/19/2020 Occupation: @GUAROCC @  Subjective:  No chief complaint on file.   History of Present Illness: Lynn Ponce is a 44 y.o. female ***   Activities of Daily Living:  Patient reports morning stiffness for *** {minute/hour:19697}.   Patient {ACTIONS;DENIES/REPORTS:21021675::"Denies"} nocturnal pain.  Difficulty dressing/grooming: {ACTIONS;DENIES/REPORTS:21021675::"Denies"} Difficulty climbing stairs: {ACTIONS;DENIES/REPORTS:21021675::"Denies"} Difficulty getting out of chair: {ACTIONS;DENIES/REPORTS:21021675::"Denies"} Difficulty using hands for taps, buttons, cutlery, and/or writing: {ACTIONS;DENIES/REPORTS:21021675::"Denies"}  No Rheumatology ROS completed.   PMFS History:  Patient Active Problem List   Diagnosis Date Noted  . OSA (obstructive sleep apnea) 10/30/2018  . Medication overuse headache 10/30/2018  . Idiopathic chronic gout of left knee without tophus 03/22/2017  . Tobacco use 03/22/2017  . History of depression/ with past suicide attempt 03/22/2017  . History of IBS 03/22/2017  . History of diabetes mellitus 03/22/2017  . History of diabetic neuropathy 03/22/2017  . Hair thinning 03/21/2017  . History of kidney stones 03/21/2017  . History of asthma 03/21/2017  . History of migraine 03/21/2017  . Chronic migraine w/o aura w/o status migrainosus, not intractable 03/08/2016  . Alteration consciousness 11/08/2015  . Migraine 11/08/2015  . CELLULITIS/ABSCESS, ARM 07/15/2008  . HYPERTHYROIDISM 06/14/2008  . MORBID OBESITY 06/14/2008  . ANXIETY 06/14/2008  . MIGRAINE, CLASSICAL 06/14/2008  . ASTHMA 06/14/2008  . MENOPAUSE, SURGICAL 06/14/2008  . LYMPHADENOPATHY 06/14/2008  . Hx of cholecystectomy 06/14/2008    Past Medical History:  Diagnosis Date  . Anxiety   .  Asthma   . Cellulitis 08/2018   Right leg, hospitalized for a week   . Depression   . Diabetes mellitus without complication ([place])   . Hyperlipemia   . Hypertension   . Renal disorder   . Sepsis ([place]) 08/2018   hospitalized in ICU     Family History  Problem Relation Age of Onset  . Retinal detachment Sister   . Neuromuscular disorder Sister        NMO- has tremor   . Asthma Son   . Asthma Daughter   . Seizures Neg Hx   . Migraines Neg Hx    Past Surgical History:  Procedure Laterality Date  . ADENOIDECTOMY    . ANKLE SURGERY     x1  . CHOLECYSTECTOMY    . FOOT SURGERY     x4  . OTHER SURGICAL HISTORY     lap for ovaries x6  . TONSILLECTOMY    . VAGINAL HYSTERECTOMY     Social History   Social History Narrative   Lives at home alone    Right handed   Caffeine use: 1 cup per day (soda/coffee)   Immunization History  Administered Date(s) Administered  . Tdap 12/13/2013     Objective: Vital Signs: There were no vitals taken for this visit.   Physical Exam   Musculoskeletal Exam: ***  CDAI Exam: CDAI Score: -- Patient Global: --; Provider Global: -- Swollen: --; Tender: -- Joint Exam 01/19/2020   No joint exam has been documented for this visit   There is currently no information documented on the homunculus. Go to the Rheumatology activity and complete the homunculus joint exam.  Investigation: No additional findings.  Imaging: No results found.  Recent Labs: Lab Results  Component Value Date   WBC 8.5 07/02/2019   HGB 14.3 07/02/2019   PLT 192 07/02/2019   NA 138 07/02/2019   K 4.0 07/02/2019   CL 106 07/02/2019   CO2 21 (L) 07/02/2019   GLUCOSE 133 (H) 07/02/2019   BUN 15 07/02/2019   CREATININE 1.20 (H) 07/02/2019   BILITOT 0.4 07/02/2019   ALKPHOS 70 07/02/2019   AST 39 07/02/2019   ALT 29 07/02/2019   PROT 7.6 07/02/2019   ALBUMIN 4.5 07/02/2019   CALCIUM 9.4 07/02/2019   GFRAA >60 07/02/2019    Speciality Comments: PLQ Eye  Exam: 02/11/18 WNL @ [facility] Follow up in 6 months  Procedures:  No procedures performed Allergies: Sulfonamide derivatives, Erythromycin, Ibuprofen, Mushroom extract complex, Toradol [ketorolac tromethamine], Varenicline, and Azithromycin   Assessment / Plan:     Visit Diagnoses: No diagnosis found.  Orders: No orders of the defined types were placed in this encounter.  No orders of the defined types were placed in this encounter.   Face-to-face time spent with patient was *** minutes. Greater than 50% of time was spent in counseling and coordination of care.  Follow-Up Instructions: No follow-ups on file.   Earnestine Mealing, CMA  Note - This record has been created using Editor, commissioning.  Chart creation errors have been sought, but may not always  have been located. Such creation errors do not reflect on  the standard of medical care.

## 2020-01-19 ENCOUNTER — Ambulatory Visit: Payer: Medicare Other | Admitting: Physician Assistant

## 2020-01-29 ENCOUNTER — Other Ambulatory Visit: Payer: Self-pay | Admitting: Rheumatology

## 2020-01-29 NOTE — Telephone Encounter (Signed)
Last Visit: 07/08/2019 Next Visit: 02/16/20 Labs: 07/23/2019  Okay to refill per Dr. Estanislado Pandy

## 2020-02-08 NOTE — Progress Notes (Signed)
Office Visit Note  Patient: Lynn Ponce             Date of Birth: 06/22/1976           MRN: [id number]             PCP: Antonietta Jewel, MD Referring: Antonietta Jewel, MD Visit Date: 02/16/2020 Occupation: @GUAROCC @  Subjective:  Medication monitoring   History of Present Illness: Lynn Ponce is a 44 y.o. female with history of autoimmune disease, DDD, and gout. She is taking plaquenil 200 mg 1 tablet by mouth twice daily.  She states she had a recent flare and was started on a prednisone taper, which resolved her symptoms. She has been experiencing pain and intermittent swelling in both hands and both knee joints.  She states she had to go to physical therapy for trochanteric bursitis of the right hip.  She states she has been on 3 rounds of prednisone within the past several months. She states that prednisone resolves her joint pain and inflammation. She denies any recent rashes.  She states she has a patch on her scalp that has been itching and has surrounding hair loss. She has recurrent oral and nasal ulcerations but none currently. She has chronic sicca symptoms.    She takes allopurinol 300 mg po daily and colchicine 0.6 mg 1 capsule daily for management of gout.  She denies any recent gout flares.     Activities of Daily Living:  Patient reports morning stiffness for 2-3 hours.   Patient Reports nocturnal pain.  Difficulty dressing/grooming: Reports Difficulty climbing stairs: Reports Difficulty getting out of chair: Reports Difficulty using hands for taps, buttons, cutlery, and/or writing: Reports  Review of Systems  Constitutional: Positive for fatigue.  HENT: Positive for mouth sores, mouth dryness and nose dryness.   Eyes: Positive for pain, redness and dryness. Negative for visual disturbance.  Respiratory: Negative for cough, hemoptysis, shortness of breath and difficulty breathing.   Cardiovascular: Negative for chest pain, palpitations, hypertension and swelling in  legs/feet.  Gastrointestinal: Positive for diarrhea. Negative for blood in stool and constipation.  Endocrine: Negative for increased urination.  Genitourinary: Negative for difficulty urinating and painful urination.  Musculoskeletal: Positive for arthralgias, joint pain and morning stiffness. Negative for joint swelling, myalgias, muscle weakness, muscle tenderness and myalgias.  Skin: Positive for rash and hair loss. Negative for color change, pallor, nodules/bumps, skin tightness, ulcers and sensitivity to sunlight.  Allergic/Immunologic: Negative for susceptible to infections.  Neurological: Negative for dizziness, numbness, headaches and memory loss.  Hematological: Negative for bruising/bleeding tendency and swollen glands.  Psychiatric/Behavioral: Negative for depressed mood, confusion and sleep disturbance. The patient is not nervous/anxious.     PMFS History:  Patient Active Problem List   Diagnosis Date Noted  . OSA (obstructive sleep apnea) 10/30/2018  . Medication overuse headache 10/30/2018  . Idiopathic chronic gout of left knee without tophus 03/22/2017  . Tobacco use 03/22/2017  . History of depression/ with past suicide attempt 03/22/2017  . History of IBS 03/22/2017  . History of diabetes mellitus 03/22/2017  . History of diabetic neuropathy 03/22/2017  . Hair thinning 03/21/2017  . History of kidney stones 03/21/2017  . History of asthma 03/21/2017  . History of migraine 03/21/2017  . Chronic migraine w/o aura w/o status migrainosus, not intractable 03/08/2016  . Alteration consciousness 11/08/2015  . Migraine 11/08/2015  . CELLULITIS/ABSCESS, ARM 07/15/2008  . HYPERTHYROIDISM 06/14/2008  . MORBID OBESITY 06/14/2008  . ANXIETY 06/14/2008  .  MIGRAINE, CLASSICAL 06/14/2008  . ASTHMA 06/14/2008  . MENOPAUSE, SURGICAL 06/14/2008  . LYMPHADENOPATHY 06/14/2008  . Hx of cholecystectomy 06/14/2008    Past Medical History:  Diagnosis Date  . Anxiety   . Asthma     . Cellulitis 08/2018   Right leg, hospitalized for a week   . Depression   . Diabetes mellitus without complication ([place])   . Hyperlipemia   . Hypertension   . Renal disorder   . Sepsis ([place]) 08/2018   hospitalized in ICU     Family History  Problem Relation Age of Onset  . Retinal detachment Sister   . Neuromuscular disorder Sister        NMO- has tremor   . Asthma Son   . Asthma Daughter   . Seizures Neg Hx   . Migraines Neg Hx    Past Surgical History:  Procedure Laterality Date  . ADENOIDECTOMY    . ANKLE SURGERY     x1  . CHOLECYSTECTOMY    . FOOT SURGERY     x4  . OTHER SURGICAL HISTORY     lap for ovaries x6  . TONSILLECTOMY    . VAGINAL HYSTERECTOMY     Social History   Social History Narrative   Lives at home alone    Right handed   Caffeine use: 1 cup per day (soda/coffee)   Immunization History  Administered Date(s) Administered  . Tdap 12/13/2013     Objective: Vital Signs: BP 113/77 (BP Location: Left Wrist, Patient Position: Sitting, Cuff Size: Normal)   Pulse 83   Resp 17   Ht 5' 8"  (1.727 m)   Wt (!) 318 lb (144.2 kg)   BMI 48.35 kg/m    Physical Exam Vitals and nursing note reviewed.  Constitutional:      Appearance: She is well-developed.  HENT:     Head: Normocephalic and atraumatic.  Eyes:     Conjunctiva/sclera: Conjunctivae normal.  Pulmonary:     Effort: Pulmonary effort is normal.  Abdominal:     General: Bowel sounds are normal.     Palpations: Abdomen is soft.  Musculoskeletal:     Cervical back: Normal range of motion.  Lymphadenopathy:     Cervical: No cervical adenopathy.  Skin:    General: Skin is warm and dry.     Capillary Refill: Capillary refill takes less than 2 seconds.  Neurological:     Mental Status: She is alert and oriented to person, place, and time.  Psychiatric:        Behavior: Behavior normal.      Musculoskeletal Exam: C-spine, thoracic spine, lumbar spine good range of motion.  Shoulder  joints, elbow joints, wrist joints, MCPs, PIPs and DIPs good range of motion with no synovitis.  She has complete fist formation bilaterally.  Hip joints, knee joints, ankle joints, MTPs, PIPs, DIPs good range of motion with no synovitis.  No warmth or effusion of bilateral knee joints.  No tenderness or swelling of ankle joints.  CDAI Exam: CDAI Score: -- Patient Global: --; Provider Global: -- Swollen: --; Tender: -- Joint Exam 02/16/2020   No joint exam has been documented for this visit   There is currently no information documented on the homunculus. Go to the Rheumatology activity and complete the homunculus joint exam.  Investigation: No additional findings.  Imaging: No results found.  Recent Labs: Lab Results  Component Value Date   WBC 8.5 07/02/2019   HGB 14.3 07/02/2019   PLT  192 07/02/2019   NA 138 07/02/2019   K 4.0 07/02/2019   CL 106 07/02/2019   CO2 21 (L) 07/02/2019   GLUCOSE 133 (H) 07/02/2019   BUN 15 07/02/2019   CREATININE 1.20 (H) 07/02/2019   BILITOT 0.4 07/02/2019   ALKPHOS 70 07/02/2019   AST 39 07/02/2019   ALT 29 07/02/2019   PROT 7.6 07/02/2019   ALBUMIN 4.5 07/02/2019   CALCIUM 9.4 07/02/2019   GFRAA >60 07/02/2019    Speciality Comments: PLQ Eye Exam: 02/11/18 WNL @ [facility] Follow up in 6 months  Procedures:  No procedures performed Allergies: Sulfonamide derivatives, Erythromycin, Ibuprofen, Mushroom extract complex, Toradol [ketorolac tromethamine], Varenicline, and Azithromycin   Assessment / Plan:     Visit Diagnoses: Autoimmune disease ([place]) - +ANA1:320NH, ENA-, acl,LA,b2 negative , history of fatigue, dry mouth, oral ulcers, photosensitivity, arthralgias: According to the patient she had a recent flare of autoimmune disease.  She was prescribed a prednisone taper by her PCP which resolved her symptoms.  She continues to take Plaquenil 200 mg 1 tablet by mouth twice daily.  She experiences pain and intermittent  swelling in both hands and both knee joints, which resolves with use of prednisone.  She states that she has been on 3 prednisone tapers in the past 2 to 3 months.  She has noticed weight gain while taking prednisone.  She has noticed increased hair loss, rashes on the palmar aspect of both hands, recurrent oronasal ulcerations, chronic sicca symptoms, and fatigue.  Complements within normal limits, sed rate 14, uric acid 5.2, double-stranded DNA less than 1 on 07/08/2019.  Her autoimmune lab work has not revealed signs of a flare in over 2 years. We will check her autoimmune lab work today.  She was advised to obtain autoimmune lab work when she feels like she is having a flare so we can assess if her symptoms are due to underlying autoimmune disease.  She will continue taking Plaquenil as prescribed.  She will follow-up in the office in 5 months.- Plan: CBC with Differential/Platelet, COMPLETE METABOLIC PANEL WITH GFR, Urinalysis, Routine w reflex microscopic, ANA, Anti-DNA antibody, double-stranded, Sedimentation rate, C3 and C4  High risk medication use - Plaquenil 200 mg by mouth twice a day started by Dr.Zulkoska 2/18. PLQ Eye Exam: 02/11/18.  She is due to update PLQ eye exam.  CBC and CMP will be drawn today to monitor for drug toxicity.- Plan: CBC with Differential/Platelet, COMPLETE METABOLIC PANEL WITH GFR  Idiopathic chronic gout of left knee without tophus - [place] proven.She was taken off of Uloric by her nephrologist.  She was started on allopurinol 300 mg daily and continues to take colchicine 0.6 daily.  She has not had any recent gout flares.  Her uric acid was 5.2 on 07/08/2019.  Uric acid level will be checked today.  She was advised to notify us if she develops signs or symptoms of a gout flare.  She will continue taking allopurinol and colchicine as prescribed.- Plan: Uric acid  DDD (degenerative disc disease), lumbar: She experiences intermittent lower back pain.  She has no midline spinal  tenderness.  No symptoms of radiculopathy.  Other medical conditions are listed as follows:  History of cholecystectomy  History of asthma  History of diabetes mellitus  History of lymphadenopathy  History of anxiety  History of IBS  History of diabetic neuropathy  History of depression  History of migraine  History of kidney stones  Orders: Orders Placed This Encounter  Procedures  . CBC with Differential/Platelet  . COMPLETE METABOLIC PANEL WITH GFR  . Urinalysis, Routine w reflex microscopic  . ANA  . Anti-DNA antibody, double-stranded  . Sedimentation rate  . C3 and C4  . Uric acid   No orders of the defined types were placed in this encounter.    Follow-Up Instructions: Return in about 5 months (around 07/18/2020) for Autoimmune Disease.   Ofilia Neas, PA-C  Note - This record has been created using Dragon software.  Chart creation errors have been sought, but may not always  have been located. Such creation errors do not reflect on  the standard of medical care.

## 2020-02-16 ENCOUNTER — Ambulatory Visit (INDEPENDENT_AMBULATORY_CARE_PROVIDER_SITE_OTHER): Payer: Medicare Other | Admitting: Physician Assistant

## 2020-02-16 ENCOUNTER — Other Ambulatory Visit: Payer: Self-pay

## 2020-02-16 ENCOUNTER — Encounter: Payer: Self-pay | Admitting: Physician Assistant

## 2020-02-16 VITALS — BP 113/77 | HR 83 | Resp 17 | Ht 68.0 in | Wt 318.0 lb

## 2020-02-16 DIAGNOSIS — Z87442 Personal history of urinary calculi: Secondary | ICD-10-CM

## 2020-02-16 DIAGNOSIS — Z79899 Other long term (current) drug therapy: Secondary | ICD-10-CM | POA: Diagnosis not present

## 2020-02-16 DIAGNOSIS — Z8639 Personal history of other endocrine, nutritional and metabolic disease: Secondary | ICD-10-CM

## 2020-02-16 DIAGNOSIS — Z8719 Personal history of other diseases of the digestive system: Secondary | ICD-10-CM

## 2020-02-16 DIAGNOSIS — Z8669 Personal history of other diseases of the nervous system and sense organs: Secondary | ICD-10-CM

## 2020-02-16 DIAGNOSIS — Z8709 Personal history of other diseases of the respiratory system: Secondary | ICD-10-CM

## 2020-02-16 DIAGNOSIS — Z8659 Personal history of other mental and behavioral disorders: Secondary | ICD-10-CM

## 2020-02-16 DIAGNOSIS — M359 Systemic involvement of connective tissue, unspecified: Secondary | ICD-10-CM

## 2020-02-16 DIAGNOSIS — M1A0620 Idiopathic chronic gout, left knee, without tophus (tophi): Secondary | ICD-10-CM

## 2020-02-16 DIAGNOSIS — Z9049 Acquired absence of other specified parts of digestive tract: Secondary | ICD-10-CM

## 2020-02-16 DIAGNOSIS — M5136 Other intervertebral disc degeneration, lumbar region: Secondary | ICD-10-CM

## 2020-02-16 DIAGNOSIS — Z87898 Personal history of other specified conditions: Secondary | ICD-10-CM

## 2020-02-16 DIAGNOSIS — R21 Rash and other nonspecific skin eruption: Secondary | ICD-10-CM

## 2020-02-18 LAB — CBC WITH DIFFERENTIAL/PLATELET
Absolute Monocytes: 412 cells/uL (ref 200–950)
Basophils Absolute: 50 cells/uL (ref 0–200)
Basophils Relative: 0.7 %
Eosinophils Absolute: 327 cells/uL (ref 15–500)
Eosinophils Relative: 4.6 %
HCT: 42.7 % (ref 35.0–45.0)
Hemoglobin: 14.5 g/dL (ref 11.7–15.5)
Lymphs Abs: 2457 cells/uL (ref 850–3900)
MCH: 30 pg (ref 27.0–33.0)
MCHC: 34 g/dL (ref 32.0–36.0)
MCV: 88.2 fL (ref 80.0–100.0)
MPV: 10.8 fL (ref 7.5–12.5)
Monocytes Relative: 5.8 %
Neutro Abs: 3855 cells/uL (ref 1500–7800)
Neutrophils Relative %: 54.3 %
Platelets: 213 10*3/uL (ref 140–400)
RBC: 4.84 10*6/uL (ref 3.80–5.10)
RDW: 13.2 % (ref 11.0–15.0)
Total Lymphocyte: 34.6 %
WBC: 7.1 10*3/uL (ref 3.8–10.8)

## 2020-02-18 LAB — URINALYSIS, ROUTINE W REFLEX MICROSCOPIC
Bilirubin Urine: NEGATIVE
Hgb urine dipstick: NEGATIVE
Leukocytes,Ua: NEGATIVE
Nitrite: NEGATIVE
RBC / HPF: NONE SEEN /HPF (ref 0–2)
Specific Gravity, Urine: 1.024 (ref 1.001–1.03)
pH: <=5 (ref 5.0–8.0)

## 2020-02-18 LAB — COMPLETE METABOLIC PANEL WITH GFR
AG Ratio: 1.8 (calc) (ref 1.0–2.5)
ALT: 25 U/L (ref 6–29)
AST: 27 U/L (ref 10–35)
Albumin: 4.5 g/dL (ref 3.6–5.1)
Alkaline phosphatase (APISO): 75 U/L (ref 31–125)
BUN/Creatinine Ratio: 13 (calc) (ref 6–22)
BUN: 15 mg/dL (ref 7–25)
CO2: 22 mmol/L (ref 20–32)
Calcium: 9 mg/dL (ref 8.6–10.2)
Chloride: 105 mmol/L (ref 98–110)
Creat: 1.17 mg/dL — ABNORMAL HIGH (ref 0.50–1.10)
GFR, Est African American: 65 mL/min/{1.73_m2} (ref >=60)
GFR, Est Non African American: 56 mL/min/{1.73_m2} — ABNORMAL LOW (ref >=60)
Globulin: 2.5 g/dL (calc) (ref 1.9–3.7)
Glucose, Bld: 301 mg/dL — ABNORMAL HIGH (ref 65–99)
Potassium: 3.9 mmol/L (ref 3.5–5.3)
Sodium: 138 mmol/L (ref 135–146)
Total Bilirubin: 0.4 mg/dL (ref 0.2–1.2)
Total Protein: 7 g/dL (ref 6.1–8.1)

## 2020-02-18 LAB — ANA: Anti Nuclear Antibody (ANA): POSITIVE — AB

## 2020-02-18 LAB — ANTI-DNA ANTIBODY, DOUBLE-STRANDED: ds DNA Ab: <1 IU/mL

## 2020-02-18 LAB — C3 AND C4
C3 Complement: 184 mg/dL (ref 83–193)
C4 Complement: 43 mg/dL (ref 15–57)

## 2020-02-18 LAB — ANTI-NUCLEAR AB-TITER (ANA TITER): ANA Titer 1: 1:40 {titer} — ABNORMAL HIGH

## 2020-02-18 LAB — SEDIMENTATION RATE: Sed Rate: 14 mm/h (ref 0–20)

## 2020-02-18 LAB — URIC ACID: Uric Acid, Serum: 5.6 mg/dL (ref 2.5–7.0)

## 2020-02-18 NOTE — Progress Notes (Signed)
UA revealed 2+ protein.  Please advise patient to return to check protein creatinine ratio.  Does she see a nephrologist?  CBC WNL.  Creatinine elevated but stable. GFR is 56.  Glucose was 301.  Please notify patient.  DsDNA negative.  C3 and C4 WNL.  ESR WNL.uric acid WNL.  ANA is low titer.  Labs are not consistent with a flare at this time.

## 2020-02-23 ENCOUNTER — Telehealth: Payer: Self-pay | Admitting: Rheumatology

## 2020-02-23 NOTE — Telephone Encounter (Signed)
Patient called stating she was returning  Andrea's call regarding her labwork results.

## 2020-02-23 NOTE — Telephone Encounter (Signed)
Patient called stating after reviewing the results of her labwork, patient is requesting to speak with Dr. Estanislado Pandy directly.  Please advise.

## 2020-02-23 NOTE — Telephone Encounter (Signed)
Returned patient's call and advised her of lab results and recommendations, see lab note for more detail.

## 2020-02-23 NOTE — Telephone Encounter (Addendum)
I returned patient's call and discussed lab work with her.  Her autoimmune labs are basically negative currently.  I discussed with her that she may taper Plaquenil 200 mg p.o. daily from twice daily dosing.  We can repeat her labs at the follow-up visit and see if her autoimmune antibody titers change.  Also if she develops any new symptoms she can notify us.  At this time it does not appear that she is having a flare of autoimmune disease. Bo Merino, MD

## 2020-04-25 ENCOUNTER — Telehealth: Payer: Self-pay | Admitting: Neurology

## 2020-04-25 NOTE — Telephone Encounter (Signed)
I would need to know more. She has not complained of "twitching" in the past but has talked to Dr Jaynee Eagles about tremor. Dr Jaynee Eagles discussed her being on multiple medications that cause tremor. Has she discussed this with PCP? Dr Jaynee Eagles advised that she try to decrease medication doses. Was this attempted? She also recommended OT. Was this completed? I do not see any notes from OT. I am not opposed to seeing her in the office but I do not know what else I can do for her at this time. If "twitching" is different from her previous reports of tremor, she needs to discuss workup with PCP first. TY.

## 2020-04-25 NOTE — Telephone Encounter (Signed)
Pt called wanting to speak to RN because she states that her twitching in her leg has become worse and more consistent now and it is debilitating and she is wanting to discuss what can be done to help with this. Please advise.

## 2020-04-26 NOTE — Telephone Encounter (Signed)
Please let her know that I do not have any other recommendations. I am not opposed to seeing her but would not be able to make any further recommendations until discussing case with Dr Jaynee Eagles who is out of the office. PCP should be able to help her with concerns of fatigue and sleepiness.

## 2020-04-26 NOTE — Telephone Encounter (Signed)
I spoke with the pt. She said the leg tremors/twitching were discussed last time and stated provider felt it may not be anything significant (it was intermittent) but to let her know if it worsened. She was primarily seen for tremors and shaking in hands. The pt indicated she had already made medication changes and did OT and had done all she could. The pt states now over the last few days the legs have worsened to the point that it is really bothering her. She already uses a cane to ambulate. She said she does not feel anxious or has changed any medications. She said she feels very tired, fatigued, sleepy. I asked her if she had checked with pcp for eval in case this was something either thyroid or viral related and she denied. She stated she didn't have any fever cough, etc. She would like a call back with provider's recommendations. Pt verbalized appreciation for the call.

## 2020-04-26 NOTE — Telephone Encounter (Signed)
Spoke with pt and discussed message from Amy. The pt declined an appt with Amy NP and stated she wanted to see Dr. Jaynee Eagles. I offered her Dr. Cathren Laine next available appt which was in September. The pt accepted. She was placed on the wait list. I reiterated to her that she should see PCP for the fatigue and sleepiness. I also let the pt know that if she is acutely getting worse or there is an emergency to please call 911 and go to the emergency department as outpatient treatment would take some time compared to hospital. The pt verbalized understanding and appreciation for the call.

## 2020-04-27 NOTE — Telephone Encounter (Signed)
Dr Jaynee Eagles, let me know if you have any other suggestions for this patient. Last note indicates that polypharmacy and noncompliance are major barriers to care. I do not know what else to recommend at this time. I am happy to see her for follow up if you think there is anything else we can offer.

## 2020-04-28 NOTE — Telephone Encounter (Signed)
I had a long conversation with patient in the past, and transferred her care to one of the academic institutions because I have no more to offer here in Neurology. Patient at that time was grateful and agreed to transfer and acknowledged we have done everything we can do.  thanks

## 2020-04-28 NOTE — Telephone Encounter (Signed)
We did talk about her "twitching". She reported Leg twitching while sleeping and did not seem to be RLS, and I thought it may be PLMS especially given the sleepiness she is reporting - I asked her thydrate well, watch diet and electrolytes, recommended stretching prior to bed and  asked PT to teach stretching exercises(did she go to PT?). Please ask if it occurs while she is sleeping and if it is the same twitching we discussed because we may need a sleep evaluation for Periodic Limb Movements of Sleep (PLMS). I will also include Dr. Brett Fairy on this.

## 2020-05-03 NOTE — Telephone Encounter (Signed)
Pt called me back to clarify that the twitching does not happen everyday. In fact she can go maybe 3 days with relief. She isn't exhausted on those days and she can function. However on the days when it comes and her "body is acting crazy" then it's debilitating and wipes her out. Pt felt this information may be helpful. I verbalized appreciation for the call and advised this would be documented and sent to Dr. Jaynee Eagles.

## 2020-05-03 NOTE — Telephone Encounter (Signed)
Spoke with pt and discussed message from Dr. Jaynee Eagles. Pt said she did go to PT. She stated this was initially only happening when she laid down to sleep.Hard to get to sleep but when she does go to sleep she is not aware of any movements and it doesn't wake her up. She now has the twitching throughout the day. It can happen anytime. She can be resting or sitting up watching TV. Her whole leg (hip to toe) contracts/twitches multiple times for 1-2 minutes then stops. She said she already had a hard time walking but has improved some since Tuesday. Later in the day when more tired she is afraid they will cause her to fall because they are unpredictable. No pain but is uncomfortable. Pt worried something wrong with brain or back. I advised pt message would be sent to Dr. Jaynee Eagles for review. She verbalized appreciation.

## 2020-05-03 NOTE — Telephone Encounter (Signed)
I really have to see it, I don't know what this is. I'm happy to see her but I would really need a video, if it happens all day she should be able to get one hopefully. If she can video it with her phone she can send that to [place] via epic and let me take a look at it thanks

## 2020-05-04 NOTE — Telephone Encounter (Signed)
Called pt & LVM (ok per DPR) with Dr. Cathren Laine latest message asking for video of her episodes. Asked for her to send to [place] through mychart. Left office number and hours in message.

## 2020-05-13 ENCOUNTER — Other Ambulatory Visit: Payer: Self-pay | Admitting: Neurology

## 2020-05-27 LAB — HM DIABETES EYE EXAM

## 2020-07-16 ENCOUNTER — Other Ambulatory Visit: Payer: Self-pay | Admitting: Rheumatology

## 2020-07-16 DIAGNOSIS — M359 Systemic involvement of connective tissue, unspecified: Secondary | ICD-10-CM

## 2020-07-18 NOTE — Telephone Encounter (Signed)
Last Visit: 02/16/2020 Next Visit: 07/19/2020 Labs: 02/16/2020 CBC WNL. Creatinine elevated but stable. GFR is 56. Glucose was 301.  Eye exam:   02/11/18 WNL   Current Dose per office note 02/16/2020: Plaquenil 200 mg by mouth twice a day and allopurinol 300 mg daily  DX: Autoimmune disease and Idiopathic chronic gout of left knee without tophus   Appointment noted for tomorrow to remind patient she needs PLQ eye exam.   Okay to refill Plaquenil?

## 2020-07-19 ENCOUNTER — Other Ambulatory Visit: Payer: Self-pay

## 2020-07-19 ENCOUNTER — Encounter: Payer: Self-pay | Admitting: Physician Assistant

## 2020-07-19 ENCOUNTER — Ambulatory Visit (INDEPENDENT_AMBULATORY_CARE_PROVIDER_SITE_OTHER): Payer: Medicare Other | Admitting: Physician Assistant

## 2020-07-19 VITALS — BP 91/65 | HR 109 | Resp 12 | Ht 68.0 in | Wt 318.8 lb

## 2020-07-19 DIAGNOSIS — M359 Systemic involvement of connective tissue, unspecified: Secondary | ICD-10-CM

## 2020-07-19 DIAGNOSIS — M51369 Other intervertebral disc degeneration, lumbar region without mention of lumbar back pain or lower extremity pain: Secondary | ICD-10-CM

## 2020-07-19 DIAGNOSIS — M1A0620 Idiopathic chronic gout, left knee, without tophus (tophi): Secondary | ICD-10-CM | POA: Diagnosis not present

## 2020-07-19 DIAGNOSIS — Z79899 Other long term (current) drug therapy: Secondary | ICD-10-CM

## 2020-07-19 DIAGNOSIS — Z9049 Acquired absence of other specified parts of digestive tract: Secondary | ICD-10-CM

## 2020-07-19 DIAGNOSIS — Z8719 Personal history of other diseases of the digestive system: Secondary | ICD-10-CM

## 2020-07-19 DIAGNOSIS — Z8709 Personal history of other diseases of the respiratory system: Secondary | ICD-10-CM

## 2020-07-19 DIAGNOSIS — Z8659 Personal history of other mental and behavioral disorders: Secondary | ICD-10-CM

## 2020-07-19 DIAGNOSIS — Z8639 Personal history of other endocrine, nutritional and metabolic disease: Secondary | ICD-10-CM

## 2020-07-19 DIAGNOSIS — Z87898 Personal history of other specified conditions: Secondary | ICD-10-CM

## 2020-07-19 DIAGNOSIS — Z8669 Personal history of other diseases of the nervous system and sense organs: Secondary | ICD-10-CM

## 2020-07-19 DIAGNOSIS — M5136 Other intervertebral disc degeneration, lumbar region: Secondary | ICD-10-CM | POA: Diagnosis not present

## 2020-07-19 DIAGNOSIS — Z87442 Personal history of urinary calculi: Secondary | ICD-10-CM

## 2020-07-19 NOTE — Patient Instructions (Signed)
COVID-19 vaccine recommendations:   COVID-19 vaccine is recommended for everyone (unless you are allergic to a vaccine component), even if you are on a medication that suppresses your immune system.   If you are on Methotrexate, Cellcept (mycophenolate), Rinvoq, Morrie Sheldon, and Olumiant- hold the medication for 1 week after each vaccine. Hold Methotrexate for 2 weeks after the single dose COVID-19 vaccine.   If you are on Orencia subcutaneous injection - hold medication one week prior to and one week after the first COVID-19 vaccine dose (only).   If you are on Orencia IV infusions- time vaccination administration so that the first COVID-19 vaccination will occur four weeks after the infusion and postpone the subsequent infusion by one week.   If you are on Cyclophosphamide or Rituxan infusions please contact your doctor prior to receiving the COVID-19 vaccine.   Do not take Tylenol or any anti-inflammatory medications (NSAIDs) 24 hours prior to the COVID-19 vaccination.   There is no direct evidence about the efficacy of the COVID-19 vaccine in individuals who are on medications that suppress the immune system.   Even if you are fully vaccinated, and you are on any medications that suppress your immune system, please continue to wear a mask, maintain at least six feet social distance and practice hand hygiene.   If you develop a COVID-19 infection, please contact your PCP or our office to determine if you need antibody infusion.  The booster vaccine is now available for immunocompromised patients. It is advised that if you had Pfizer vaccine you should get Coca-Cola booster.  If you had a Moderna vaccine then you should get a Moderna booster. Johnson and Wynetta Emery does not have a booster vaccine at this time.  Please see the following web sites for updated information.    https://[email or web address]/[email or web address]/[email or web address]-Vaccination-Patient-Resources.[email or web address]  [email or web address]://[email or web address]/About-[email or web address]-Releases/ID/[email or web address]  Standing Labs We placed an order today for your standing lab work.   Please have your standing labs drawn in September and every 5 months   If possible, please have your labs drawn 2 weeks prior to your appointment so that the provider can discuss your results at your appointment.  We have open lab daily Monday through Thursday from 8:30-12:30 PM and 1:30-4:30 PM and Friday from 8:30-12:30 PM and 1:30-4:00 PM at the office of Dr. Bo Merino, [facility].   Please be advised, patients with office appointments requiring lab work will take precedents over walk-in lab work.  If possible, please come for your lab work on Monday and Friday afternoons, as you may experience shorter wait times. The office is located at [address] [place], [place], [place] [address] No appointment is necessary.   Labs are drawn by [facility]. Please bring your co-pay at the time of your lab draw.  You may receive a bill from [facility] for your lab work.  If you wish to have your labs drawn at another location, please call the office 24 hours in advance to send orders.  If you have any questions regarding directions or hours of operation,  please call [phone].   As a reminder, please drink plenty of water prior to coming for your lab work. Thanks!

## 2020-07-19 NOTE — Progress Notes (Addendum)
Office Visit Note  Patient: Lynn Ponce             Date of Birth: [birth year]           MRN: [id number]             PCP: Antonietta Jewel, MD Referring: Antonietta Jewel, MD Visit Date: 07/19/2020 Occupation: _0 @  Subjective:  Medication monitoring   History of Present Illness: Lynn Ponce is a 44 y.o. female with history of autoimmune disease and gout.  Patient is taking Plaquenil 200 mg 1 tablet by mouth daily.  She is tolerating Plaquenil without any side effects.  She updated her Plaquenil eye exam at [facility] eye care in [place] recently.  She denies any recent signs or symptoms of an autoimmune disease flare.  She states that her fatigue has been stable.  She denies any recent facial rashes.  She states that she had a flare of eczema on the palms of both hands about 1 month ago and has been using a topical agents which has been helpful.  She continues to have recurrent oral ulcerations but denies any nasal ulcerations.  She has chronic sicca symptoms which have been tolerable.  She has been experiencing intermittent symptoms of carpal tunnel especially at night.  She started wearing her carpal tunnel night splints which have been improving her symptoms.  Patient reports that about 1 month ago she fell while in the garden and injured her coccyx.  She states that she has been trying to sit with a cushion and change positions frequently.  She continues to have intermittent pain in both knee joints especially the right knee.  She walks with a cane to assist with ambulation.   Activities of Daily Living:  Patient reports morning stiffness for 2   hours.   Patient Reports nocturnal pain.  Difficulty dressing/grooming: Denies Difficulty climbing stairs: Reports Difficulty getting out of chair: Reports Difficulty using hands for taps, buttons, cutlery, and/or writing: Denies  Review of Systems  Constitutional: Negative for fatigue.  HENT: Positive for mouth sores and mouth dryness.  Negative for nose dryness.   Eyes: Positive for dryness. Negative for pain and visual disturbance.  Respiratory: Negative for cough, hemoptysis, shortness of breath and difficulty breathing.   Cardiovascular: Negative for chest pain, palpitations, hypertension and swelling in legs/feet.  Gastrointestinal: Negative for blood in stool, constipation and diarrhea.  Endocrine: Negative for increased urination.  Genitourinary: Negative for painful urination.  Musculoskeletal: Positive for arthralgias, joint pain and morning stiffness. Negative for joint swelling, myalgias, muscle weakness, muscle tenderness and myalgias.  Skin: Negative for color change, pallor, rash, hair loss, nodules/bumps, skin tightness, ulcers and sensitivity to sunlight.  Allergic/Immunologic: Negative for susceptible to infections.  Neurological: Positive for numbness. Negative for dizziness, headaches and weakness.  Hematological: Negative for swollen glands.  Psychiatric/Behavioral: Negative for depressed mood and sleep disturbance. The patient is not nervous/anxious.     PMFS History:  Patient Active Problem List   Diagnosis Date Noted  . OSA (obstructive sleep apnea) 10/30/2018  . Medication overuse headache 10/30/2018  . Idiopathic chronic gout of left knee without tophus 03/22/2017  . Tobacco use 03/22/2017  . History of depression/ with past suicide attempt 03/22/2017  . History of IBS 03/22/2017  . History of diabetes mellitus 03/22/2017  . History of diabetic neuropathy 03/22/2017  . Hair thinning 03/21/2017  . History of kidney stones 03/21/2017  . History of asthma 03/21/2017  . History of migraine 03/21/2017  .  Chronic migraine w/o aura w/o status migrainosus, not intractable 03/08/2016  . Alteration consciousness 11/08/2015  . Migraine 11/08/2015  . CELLULITIS/ABSCESS, ARM 07/15/2008  . HYPERTHYROIDISM 06/14/2008  . MORBID OBESITY 06/14/2008  . ANXIETY 06/14/2008  . MIGRAINE, CLASSICAL 06/14/2008    . ASTHMA 06/14/2008  . MENOPAUSE, SURGICAL 06/14/2008  . LYMPHADENOPATHY 06/14/2008  . Hx of cholecystectomy 06/14/2008    Past Medical History:  Diagnosis Date  . Anxiety   . Asthma   . Cellulitis 08/2018   Right leg, hospitalized for a week   . Depression   . Diabetes mellitus without complication ([place])   . Hyperlipemia   . Hypertension   . Renal disorder   . Sepsis ([place]) 08/2018   hospitalized in ICU     Family History  Problem Relation Age of Onset  . Retinal detachment Sister   . Neuromuscular disorder Sister        NMO- has tremor   . Asthma Son   . Asthma Daughter   . Seizures Neg Hx   . Migraines Neg Hx    Past Surgical History:  Procedure Laterality Date  . ADENOIDECTOMY    . ANKLE SURGERY     x1  . CHOLECYSTECTOMY    . FOOT SURGERY     x4  . OTHER SURGICAL HISTORY     lap for ovaries x6  . TONSILLECTOMY    . VAGINAL HYSTERECTOMY     Social History   Social History Narrative   Lives at home alone    Right handed   Caffeine use: 1 cup per day (soda/coffee)   Immunization History  Administered Date(s) Administered  . PFIZER SARS-COV-2 Vaccination 07/14/2020  . Tdap 12/13/2013     Objective: Vital Signs: BP 91/65 (BP Location: Left Arm, Patient Position: Sitting, Cuff Size: Large)   Pulse (!) 109   Resp 12   Ht _0  (1.727 m)   Wt (!) 318 lb 12.8 oz (144.6 kg)   BMI 48.47 kg/m    Physical Exam Vitals and nursing note reviewed.  Constitutional:      Appearance: She is well-developed.  HENT:     Head: Normocephalic and atraumatic.  Eyes:     Conjunctiva/sclera: Conjunctivae normal.  Pulmonary:     Effort: Pulmonary effort is normal.  Abdominal:     Palpations: Abdomen is soft.  Musculoskeletal:     Cervical back: Normal range of motion.  Skin:    General: Skin is warm and dry.     Capillary Refill: Capillary refill takes less than 2 seconds.  Neurological:     Mental Status: She is alert and oriented to person, place, and time.   Psychiatric:        Behavior: Behavior normal.      Musculoskeletal Exam: C-spine, thoracic spine, and lumbar spine good ROM.  Shoulder joints, elbow joints, wrist joints, MCPs, PIPs, and DIPs good ROM with no synovitis. Complete fist formation bilaterally.  Hip joints, knee joints, ankle joints, MTPs, PIPs, and DIPs good ROM with no synovitis.  No warmth or effusion of knee joints.  No tenderness or swelling of ankle joints.    CDAI Exam: CDAI Score: -- Patient Global: --; Provider Global: -- Swollen: --; Tender: -- Joint Exam 07/19/2020   No joint exam has been documented for this visit   There is currently no information documented on the homunculus. Go to the Rheumatology activity and complete the homunculus joint exam.  Investigation: No additional findings.  Imaging: No results  found.  Recent Labs: Lab Results  Component Value Date   WBC 7.1 02/16/2020   HGB 14.5 02/16/2020   PLT 213 02/16/2020   NA 138 02/16/2020   K 3.9 02/16/2020   CL 105 02/16/2020   CO2 22 02/16/2020   GLUCOSE 301 (H) 02/16/2020   BUN 15 02/16/2020   CREATININE 1.17 (H) 02/16/2020   BILITOT 0.4 02/16/2020   ALKPHOS 70 07/02/2019   AST 27 02/16/2020   ALT 25 02/16/2020   PROT 7.0 02/16/2020   ALBUMIN 4.5 07/02/2019   CALCIUM 9.0 02/16/2020   GFRAA 65 02/16/2020    Speciality Comments: PLQ Eye Exam: 02/11/18 WNL @ [facility] Follow up in 6 months  Procedures:  No procedures performed Allergies: Sulfonamide derivatives, Erythromycin, Ibuprofen, Mushroom extract complex, Toradol [ketorolac tromethamine], Varenicline, and Azithromycin   Assessment / Plan:     Visit Diagnoses: Autoimmune disease ([place]) - +ANA1:320NH, ENA-, acl,LA,b2 negative , history of fatigue, dry mouth, oral ulcers, photosensitivity, arthralgias: She has not had any signs or symptoms of an autoimmune disease flare recently.  She is clinically doing well on Plaquenil 200 mg 1 tablet by mouth daily.  She is  tolerating Plaquenil without any side effects.  She has not noticed any new or worsening symptoms since reducing the dose of Plaquenil.  Her fatigue has been stable.  She continues to have chronic sicca symptoms which have been tolerable.  She continues to have recurrent oral ulcerations but has not had any nasal ulcerations.  She has not had any recent facial rashes but did have a flare of eczema on the palmar aspect of both hands 1 month ago.  She has been applying triamcinolone cream topically which has improved the eczema flare.  We discussed the importance of avoiding direct sun exposure and wearing sunscreen on a daily basis.  She was advised to wear sun protective clothing, widebrimmed hat, and SPF greater than 50 on a daily basis.  She continues to have intermittent arthralgias and morning stiffness but does not have any synovitis on exam.  She has not had any chest pain, palpitations, or shortness of breath recently.  Lab work from 02/16/2020 was reviewed with the patient today in the office.  Complements within normal limits, double-stranded DNA negative, ESR within normal limits.  She is due to update lab work.  She was given orders to repeat complements, ESR, double-stranded DNA, UA, CBC, CMP, and uric acid to take with her to her upcoming lab draw.  She will continue taking Plaquenil 200 mg 1 tablet by mouth daily.  I refilled Plaquenil sent to the pharmacy yesterday.  She was advised to notify us if she develops any new or worsening symptoms.  She will follow-up in the office in 5 months.  High risk medication use - Plaquenil 200 mg 1 tablet by mouth daily- started by Dr. Youlanda Mighty 2/18. PLQ Eye Exam: 05/27/20 at [facility] eye care in [place].  We called the office to obtain records which will be faxed to [place].  CBC and CMP were drawn on 02/16/2020.  She is due to update lab work.  She will be having lab work drawn soon by another provider and would like to have all of her labs drawn at once.  She was given  lab orders to take with her. She received her first COVID-19 vaccination on 07/14/2020.  Idiopathic chronic gout of left knee without tophus -  [place] proven. She was previously taken off of Uloric by her nephrologist.  She is currently taking allopurinol 300 mg 1 tablet by mouth daily colchicine 0.6 mg 1 capsule by mouth daily.  She has not had any recent gout flares.  Her uric acid level was 5.6 on 02/16/2020.  We will repeat uric acid level with upcoming lab work.  DDD (degenerative disc disease), lumbar: She continues to have chronic lower back pain and stiffness.  She has difficulty walking prolonged distances and climbing steps due to lower back pain.  She is not experiencing any symptoms of radiculopathy at this time.  She takes Percocet 10 mg 1 tablet by mouth 4 times daily as needed for pain relief.   Other medical conditions are listed as follows:   History of cholecystectomy  History of asthma  History of diabetes mellitus  History of lymphadenopathy  History of anxiety  History of IBS  History of diabetic neuropathy  History of depression  History of migraine  History of kidney stones  Orders: No orders of the defined types were placed in this encounter.  No orders of the defined types were placed in this encounter.   Follow-Up Instructions: Return in about 5 months (around 12/19/2020) for Autoimmune Disease.   Ofilia Neas, PA-C  Note - This record has been created using Dragon software.  Chart creation errors have been sought, but may not always  have been located. Such creation errors do not reflect on  the standard of medical care.

## 2020-07-27 ENCOUNTER — Encounter: Payer: Self-pay | Admitting: Physician Assistant

## 2020-07-27 ENCOUNTER — Telehealth: Payer: Self-pay | Admitting: *Deleted

## 2020-07-27 NOTE — Telephone Encounter (Signed)
Patient advised GFR is trending down. Patient advised to avoid NSAIDS. AST elevated. Patient advised she should avoid Tylenol NSAIDS and alcohol use.

## 2020-07-27 NOTE — Telephone Encounter (Signed)
Labs Received from [facility] by Hazel Sams, PA-C [place] on 07/27/2020  UA: 1+ Protein, 1+ Leukocytes, WBC 6, Hyalin Cast 10, Squamous Cells 35  Glucose 183 Creat. 1.20  GFR 49 AST 45 Triglycerides 169  Previous GFR 1.17 GFR 56   Patient on PLQ 200 mg po BID, Allopurinol 300 mg po daily and Colchicine 0.6 mg daily.   Recommendation: GFR is trending down. Please advise patient to avoid NSAIDS. AST elevated. Patient should avoid Tylenol NSAIDS and alcohol use.

## 2020-07-29 NOTE — Telephone Encounter (Signed)
Anti ds-DNA <1 C3 172 C4 40

## 2020-08-02 ENCOUNTER — Telehealth: Payer: Self-pay | Admitting: Neurology

## 2020-08-02 NOTE — Telephone Encounter (Signed)
Patient is on my schedule tomorrow again. I've had conversation with patient in the past, and transferred her care to one of the academic institutions because I have no more to offer here in Neurology. Patient at that time was grateful and agreed to transfer and acknowledged we have done everything we can do. She is on my schedule again tomorrow. Please discuss with Dr. Jaynee Eagles in the future prior to making any appointments thanks.
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
## 2020-08-03 ENCOUNTER — Other Ambulatory Visit: Payer: Self-pay

## 2020-08-03 ENCOUNTER — Ambulatory Visit (INDEPENDENT_AMBULATORY_CARE_PROVIDER_SITE_OTHER): Payer: Medicare Other | Admitting: Neurology

## 2020-08-03 ENCOUNTER — Encounter: Payer: Self-pay | Admitting: Neurology

## 2020-08-03 VITALS — BP 102/68 | HR 77 | Ht 68.0 in | Wt 311.0 lb

## 2020-08-03 DIAGNOSIS — G43711 Chronic migraine without aura, intractable, with status migrainosus: Secondary | ICD-10-CM

## 2020-08-03 DIAGNOSIS — Z79899 Other long term (current) drug therapy: Secondary | ICD-10-CM | POA: Diagnosis not present

## 2020-08-03 DIAGNOSIS — R259 Unspecified abnormal involuntary movements: Secondary | ICD-10-CM

## 2020-08-03 NOTE — Patient Instructions (Signed)
Blood work today.

## 2020-08-04 LAB — COMPREHENSIVE METABOLIC PANEL
ALT: 24 IU/L (ref 0–32)
AST: 44 IU/L — ABNORMAL HIGH (ref 0–40)
Albumin/Globulin Ratio: 1.9 (ref 1.2–2.2)
Albumin: 4.7 g/dL (ref 3.8–4.8)
Alkaline Phosphatase: 107 IU/L (ref 44–121)
BUN/Creatinine Ratio: 12 (ref 9–23)
BUN: 15 mg/dL (ref 6–24)
Bilirubin Total: 0.3 mg/dL (ref 0.0–1.2)
CO2: 22 mmol/L (ref 20–29)
Calcium: 9.6 mg/dL (ref 8.7–10.2)
Chloride: 103 mmol/L (ref 96–106)
Creatinine, Ser: 1.28 mg/dL — ABNORMAL HIGH (ref 0.57–1.00)
GFR calc Af Amer: 58 mL/min/{1.73_m2} — ABNORMAL LOW (ref >59)
GFR calc non Af Amer: 51 mL/min/{1.73_m2} — ABNORMAL LOW (ref >59)
Globulin, Total: 2.5 g/dL (ref 1.5–4.5)
Glucose: 236 mg/dL — ABNORMAL HIGH (ref 65–99)
Potassium: 4.2 mmol/L (ref 3.5–5.2)
Sodium: 141 mmol/L (ref 134–144)
Total Protein: 7.2 g/dL (ref 6.0–8.5)

## 2020-08-04 LAB — CBC
Hematocrit: 41.8 % (ref 34.0–46.6)
Hemoglobin: 14.5 g/dL (ref 11.1–15.9)
MCH: 30.9 pg (ref 26.6–33.0)
MCHC: 34.7 g/dL (ref 31.5–35.7)
MCV: 89 fL (ref 79–97)
Platelets: 192 10*3/uL (ref 150–450)
RBC: 4.69 x10E6/uL (ref 3.77–5.28)
RDW: 13.9 % (ref 11.7–15.4)
WBC: 6.4 10*3/uL (ref 3.4–10.8)

## 2020-08-04 LAB — TOPIRAMATE LEVEL: Topiramate Lvl: 7.8 ug/mL (ref 2.0–25.0)

## 2020-08-04 LAB — GABAPENTIN LEVEL: Gabapentin Lvl: 13.6 ug/mL (ref 4.0–16.0)

## 2020-08-18 ENCOUNTER — Telehealth: Payer: Self-pay

## 2020-08-18 NOTE — Telephone Encounter (Signed)
Attempted to contact the patient and left message for patient to call the office.  

## 2020-08-18 NOTE — Telephone Encounter (Signed)
Patient states she is having pain in right hip. Patient states the pain has been there for about 1 week. Patient states this has only happened once before. Patient states it is a sharp stabbing pain with movement. Patient states movement increases the pain. When resting it is a dull ache. Rest helps some. Patient states the heating pad helps some. Patient is on PLQ and taking as prescribed. Patient states she is also on Percocet from another physician and that does not touch the pain. Patient would like to know if she should schedule with our office or her orthopedic office. Please advise.

## 2020-08-18 NOTE — Telephone Encounter (Signed)
Patient called stating she has right hip pain and requesting a return call to let her know if she should schedule with her orthopedic doctor or with Dr. Estanislado Pandy.

## 2020-08-18 NOTE — Telephone Encounter (Signed)
Okay to schedule an appointment in our office for further evaluation or if she can be seen sooner in her orthopedic office that is fine as well.

## 2020-08-19 NOTE — Telephone Encounter (Signed)
Patient scheduled to be seen 08/24/2020 at 10:15 am.

## 2020-08-24 ENCOUNTER — Ambulatory Visit: Payer: Medicare Other | Admitting: Rheumatology

## 2020-10-05 ENCOUNTER — Other Ambulatory Visit: Payer: Self-pay | Admitting: Rheumatology

## 2020-10-05 DIAGNOSIS — M359 Systemic involvement of connective tissue, unspecified: Secondary | ICD-10-CM

## 2020-10-05 NOTE — Telephone Encounter (Signed)
Last Visit: 07/19/2020 Next Visit: 12/20/2020 Labs: 07/27/2020 Glucose 183 Creat. 1.20  GFR 49 AST 45 Eye exam:  05/27/2020 normal  Current Dose per office note 07/19/2020: Plaquenil 200 mg 1 tablet by mouth daily PQ:[id number] disease   Okay to refill Plaquenil?

## 2020-10-10 ENCOUNTER — Other Ambulatory Visit: Payer: Self-pay | Admitting: Neurology

## 2020-10-10 MED ORDER — TOPIRAMATE 50 MG PO TABS
ORAL_TABLET | ORAL | 0 refills | Status: DC
Start: 2020-10-10 — End: 2021-05-25

## 2020-11-19 HISTORY — PX: UPPER GI ENDOSCOPY: SHX6162

## 2020-12-06 ENCOUNTER — Other Ambulatory Visit: Payer: Self-pay | Admitting: Physician Assistant

## 2020-12-06 DIAGNOSIS — M359 Systemic involvement of connective tissue, unspecified: Secondary | ICD-10-CM

## 2020-12-19 ENCOUNTER — Encounter: Payer: Self-pay | Admitting: Nurse Practitioner

## 2020-12-19 NOTE — Progress Notes (Deleted)
Office Visit Note  Patient: Lynn Ponce             Date of Birth: 1976-04-13           MRN: [id number]             PCP: Antonietta Jewel, MD Referring: Antonietta Jewel, MD Visit Date: 12/20/2020 Occupation: @GUAROCC @  Subjective:  No chief complaint on file.   History of Present Illness: Lynn Ponce is a 45 y.o. female ***   Activities of Daily Living:  Patient reports morning stiffness for *** {minute/hour:19697}.   Patient {ACTIONS;DENIES/REPORTS:21021675::"Denies"} nocturnal pain.  Difficulty dressing/grooming: {ACTIONS;DENIES/REPORTS:21021675::"Denies"} Difficulty climbing stairs: {ACTIONS;DENIES/REPORTS:21021675::"Denies"} Difficulty getting out of chair: {ACTIONS;DENIES/REPORTS:21021675::"Denies"} Difficulty using hands for taps, buttons, cutlery, and/or writing: {ACTIONS;DENIES/REPORTS:21021675::"Denies"}  No Rheumatology ROS completed.   PMFS History:  Patient Active Problem List   Diagnosis Date Noted  . OSA (obstructive sleep apnea) 10/30/2018  . Medication overuse headache 10/30/2018  . Idiopathic chronic gout of left knee without tophus 03/22/2017  . Tobacco use 03/22/2017  . History of depression/ with past suicide attempt 03/22/2017  . History of IBS 03/22/2017  . History of diabetes mellitus 03/22/2017  . History of diabetic neuropathy 03/22/2017  . Hair thinning 03/21/2017  . History of kidney stones 03/21/2017  . History of asthma 03/21/2017  . History of migraine 03/21/2017  . Chronic migraine w/o aura w/o status migrainosus, not intractable 03/08/2016  . Alteration consciousness 11/08/2015  . Migraine 11/08/2015  . CELLULITIS/ABSCESS, ARM 07/15/2008  . HYPERTHYROIDISM 06/14/2008  . MORBID OBESITY 06/14/2008  . ANXIETY 06/14/2008  . MIGRAINE, CLASSICAL 06/14/2008  . ASTHMA 06/14/2008  . MENOPAUSE, SURGICAL 06/14/2008  . LYMPHADENOPATHY 06/14/2008  . Hx of cholecystectomy 06/14/2008    Past Medical History:  Diagnosis Date  . Anxiety   .  Asthma   . Cellulitis 08/2018   Right leg, hospitalized for a week   . Depression   . Diabetes mellitus without complication ([place])   . Hyperlipemia   . Hypertension   . Renal disorder   . Sepsis ([place]) 08/2018   hospitalized in ICU     Family History  Problem Relation Age of Onset  . Retinal detachment Sister   . Neuromuscular disorder Sister        NMO- has tremor   . Asthma Son   . Asthma Daughter   . Seizures Neg Hx   . Migraines Neg Hx    Past Surgical History:  Procedure Laterality Date  . ADENOIDECTOMY    . ANKLE SURGERY     x1  . CHOLECYSTECTOMY    . FOOT SURGERY     x4  . OTHER SURGICAL HISTORY     lap for ovaries x6  . TONSILLECTOMY    . VAGINAL HYSTERECTOMY     Social History   Social History Narrative   Lives at home alone    Right handed   Caffeine use: 1 cup per day (soda/coffee)   Immunization History  Administered Date(s) Administered  . PFIZER(Purple Top)SARS-COV-2 Vaccination 07/14/2020  . Tdap 12/13/2013     Objective: Vital Signs: There were no vitals taken for this visit.   Physical Exam   Musculoskeletal Exam: ***  CDAI Exam: CDAI Score: - Patient Global: -; Provider Global: - Swollen: -; Tender: - Joint Exam 12/20/2020   No joint exam has been documented for this visit   There is currently no information documented on the homunculus. Go to the Rheumatology activity and complete the homunculus joint exam.  Investigation: No additional findings.  Imaging: No results found.  Recent Labs: Lab Results  Component Value Date   WBC 6.4 08/03/2020   HGB 14.5 08/03/2020   PLT 192 08/03/2020   NA 141 08/03/2020   K 4.2 08/03/2020   CL 103 08/03/2020   CO2 22 08/03/2020   GLUCOSE 236 (H) 08/03/2020   BUN 15 08/03/2020   CREATININE 1.28 (H) 08/03/2020   BILITOT 0.3 08/03/2020   ALKPHOS 107 08/03/2020   AST 44 (H) 08/03/2020   ALT 24 08/03/2020   PROT 7.2 08/03/2020   ALBUMIN 4.7 08/03/2020   CALCIUM 9.6 08/03/2020    GFRAA 58 (L) 08/03/2020    Speciality Comments: PLQ Eye Exam: 05/27/2020 normal @ [facility].  Procedures:  No procedures performed Allergies: Sulfonamide derivatives, Erythromycin, Ibuprofen, Mushroom extract complex, Toradol [ketorolac tromethamine], Varenicline, and Azithromycin   Assessment / Plan:     Visit Diagnoses: No diagnosis found.  Orders: No orders of the defined types were placed in this encounter.  No orders of the defined types were placed in this encounter.   Face-to-face time spent with patient was *** minutes. Greater than 50% of time was spent in counseling and coordination of care.  Follow-Up Instructions: No follow-ups on file.   Earnestine Mealing, CMA  Note - This record has been created using Editor, commissioning.  Chart creation errors have been sought, but may not always  have been located. Such creation errors do not reflect on  the standard of medical care.

## 2020-12-20 ENCOUNTER — Ambulatory Visit: Payer: Medicare Other | Admitting: Rheumatology

## 2020-12-20 DIAGNOSIS — M1A0620 Idiopathic chronic gout, left knee, without tophus (tophi): Secondary | ICD-10-CM

## 2020-12-20 DIAGNOSIS — Z8669 Personal history of other diseases of the nervous system and sense organs: Secondary | ICD-10-CM

## 2020-12-20 DIAGNOSIS — Z79899 Other long term (current) drug therapy: Secondary | ICD-10-CM

## 2020-12-20 DIAGNOSIS — Z8659 Personal history of other mental and behavioral disorders: Secondary | ICD-10-CM

## 2020-12-20 DIAGNOSIS — Z8709 Personal history of other diseases of the respiratory system: Secondary | ICD-10-CM

## 2020-12-20 DIAGNOSIS — M5136 Other intervertebral disc degeneration, lumbar region: Secondary | ICD-10-CM

## 2020-12-20 DIAGNOSIS — M359 Systemic involvement of connective tissue, unspecified: Secondary | ICD-10-CM

## 2020-12-20 DIAGNOSIS — Z8719 Personal history of other diseases of the digestive system: Secondary | ICD-10-CM

## 2020-12-20 DIAGNOSIS — Z9049 Acquired absence of other specified parts of digestive tract: Secondary | ICD-10-CM

## 2020-12-20 DIAGNOSIS — Z87898 Personal history of other specified conditions: Secondary | ICD-10-CM

## 2020-12-20 DIAGNOSIS — Z87442 Personal history of urinary calculi: Secondary | ICD-10-CM

## 2020-12-20 DIAGNOSIS — Z8639 Personal history of other endocrine, nutritional and metabolic disease: Secondary | ICD-10-CM

## 2021-01-02 ENCOUNTER — Ambulatory Visit (INDEPENDENT_AMBULATORY_CARE_PROVIDER_SITE_OTHER): Payer: Medicare Other | Admitting: Nurse Practitioner

## 2021-01-02 ENCOUNTER — Encounter: Payer: Self-pay | Admitting: Nurse Practitioner

## 2021-01-02 VITALS — BP 116/68 | HR 74 | Ht 68.0 in | Wt 316.6 lb

## 2021-01-02 DIAGNOSIS — R1310 Dysphagia, unspecified: Secondary | ICD-10-CM | POA: Diagnosis not present

## 2021-01-02 DIAGNOSIS — Z8639 Personal history of other endocrine, nutritional and metabolic disease: Secondary | ICD-10-CM

## 2021-01-02 MED ORDER — OMEPRAZOLE 20 MG PO CPDR
20.0000 mg | DELAYED_RELEASE_CAPSULE | Freq: Two times a day (BID) | ORAL | 1 refills | Status: DC
Start: 2021-01-02 — End: 2021-01-23
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
## 2021-01-02 NOTE — Patient Instructions (Signed)
If you are age 46 or older, your body mass index should be between 23-30. Your Body mass index is 48.14 kg/m. If this is out of the aforementioned range listed, please consider follow up with your Primary Care Provider.  If you are age 84 or younger, your body mass index should be between 19-25. Your Body mass index is 48.14 kg/m. If this is out of the aformentioned range listed, please consider follow up with your Primary Care Provider.   We have sent the following medications to your pharmacy for you to pick up at your convenience: Omeprazole 20 mg twice daily.  You have been scheduled for an endoscopy. Please follow written instructions given to you at your visit today. If you use inhalers (even only as needed), please bring them with you on the day of your procedure.  Your provider has ordered Cologuard testing as an option for colon cancer screening. This is performed by [employer] and may be out of network with your insurance. PRIOR to completing the test, it is YOUR responsibility to contact your insurance about covered benefits for this test. Your out of pocket expense could be anywhere from $0.00 to $649.00.   When you call to check coverage with your insurer, please provide the following information:   -The ONLY provider of Cologuard is [place] code for Cologuard is [phone].  [employer] NPI # [id number]  -[employer] Tax ID # [id number]   We have already sent your demographic and insurance information to [employer] [facility] (phone number [phone]) and they should contact you within the next week regarding your test. If you have not heard from them within the next week, please call our office at [phone].

## 2021-01-03 ENCOUNTER — Ambulatory Visit (AMBULATORY_SURGERY_CENTER): Payer: Medicare Other | Admitting: Gastroenterology

## 2021-01-03 ENCOUNTER — Encounter: Payer: Self-pay | Admitting: Gastroenterology

## 2021-01-03 ENCOUNTER — Other Ambulatory Visit: Payer: Self-pay

## 2021-01-03 VITALS — BP 120/83 | HR 74 | Temp 97.3°F | Resp 14 | Ht 68.0 in | Wt 316.0 lb

## 2021-01-03 DIAGNOSIS — K208 Other esophagitis without bleeding: Secondary | ICD-10-CM | POA: Diagnosis not present

## 2021-01-03 DIAGNOSIS — K317 Polyp of stomach and duodenum: Secondary | ICD-10-CM | POA: Diagnosis not present

## 2021-01-03 DIAGNOSIS — K200 Eosinophilic esophagitis: Secondary | ICD-10-CM | POA: Diagnosis not present

## 2021-01-03 DIAGNOSIS — K2970 Gastritis, unspecified, without bleeding: Secondary | ICD-10-CM

## 2021-01-03 DIAGNOSIS — K319 Disease of stomach and duodenum, unspecified: Secondary | ICD-10-CM | POA: Diagnosis not present

## 2021-01-03 DIAGNOSIS — K3189 Other diseases of stomach and duodenum: Secondary | ICD-10-CM

## 2021-01-03 DIAGNOSIS — R1310 Dysphagia, unspecified: Secondary | ICD-10-CM

## 2021-01-03 MED ORDER — SODIUM CHLORIDE 0.9 % IV SOLN: 500.0000 mL | Freq: Once | INTRAVENOUS | Status: DC

## 2021-01-03 NOTE — Progress Notes (Signed)
Called to room to assist during endoscopic procedure.  Patient ID and intended procedure confirmed with present staff. Received instructions for my participation in the procedure from the performing physician.  

## 2021-01-03 NOTE — Progress Notes (Signed)
A and O x3. Report to RN. Tolerated MAC anesthesia well.Teeth unchanged after procedure.

## 2021-01-03 NOTE — Patient Instructions (Signed)
Handouts Provided:  Gastritis and Post Dilation Diet  Continue current medications  YOU HAD AN ENDOSCOPIC PROCEDURE TODAY AT [facility]:   Refer to the procedure report that was given to you for any specific questions about what was found during the examination.  If the procedure report does not answer your questions, please call your gastroenterologist to clarify.  If you requested that your care partner not be given the details of your procedure findings, then the procedure report has been included in a sealed envelope for you to review at your convenience later.  YOU SHOULD EXPECT: Some feelings of bloating in the abdomen. Passage of more gas than usual.  Walking can help get rid of the air that was put into your GI tract during the procedure and reduce the bloating. If you had a lower endoscopy (such as a colonoscopy or flexible sigmoidoscopy) you may notice spotting of blood in your stool or on the toilet paper. If you underwent a bowel prep for your procedure, you may not have a normal bowel movement for a few days.  Please Note:  You might notice some irritation and congestion in your nose or some drainage.  This is from the oxygen used during your procedure.  There is no need for concern and it should clear up in a day or so.  SYMPTOMS TO REPORT IMMEDIATELY:   Following upper endoscopy (EGD)  Vomiting of blood or coffee ground material  New chest pain or pain under the shoulder blades  Painful or persistently difficult swallowing  New shortness of breath  Fever of 100F or higher  Black, tarry-looking stools  For urgent or emergent issues, a gastroenterologist can be reached at any hour by calling [phone]. Do not use MyChart messaging for urgent concerns.    DIET:  See Post Dilation Diet provided.  Drink plenty of fluids but you should avoid alcoholic beverages for 24 hours.  ACTIVITY:  You should plan to take it easy for the rest of today and you should  NOT DRIVE or use heavy machinery until tomorrow (because of the sedation medicines used during the test).    FOLLOW UP: Our staff will call the number listed on your records 48-72 hours following your procedure to check on you and address any questions or concerns that you may have regarding the information given to you following your procedure. If we do not reach you, we will leave a message.  We will attempt to reach you two times.  During this call, we will ask if you have developed any symptoms of COVID 19. If you develop any symptoms (ie: fever, flu-like symptoms, shortness of breath, cough etc.) before then, please call [phone].  If you test positive for Covid 19 in the 2 weeks post procedure, please call and report this information to [place].    If any biopsies were taken you will be contacted by phone or by letter within the next 1-3 weeks.  Please call us at [phone] if you have not heard about the biopsies in 3 weeks.    SIGNATURES/CONFIDENTIALITY: You and/or your care partner have signed paperwork which will be entered into your electronic medical record.  These signatures attest to the fact that that the information above on your After Visit Summary has been reviewed and is understood.  Full responsibility of the confidentiality of this discharge information lies with you and/or your care-partner.

## 2021-01-03 NOTE — Progress Notes (Signed)
VS by CW.

## 2021-01-03 NOTE — Op Note (Signed)
[facility] Patient Name: Lynn Ponce Procedure Date: 01/03/2021 2:30 PM MRN: [id number] Endoscopist: Jackquline Denmark , MD Age: 45 Referring MD:  Date of Birth: [birth year] Gender: Female Account #: [id number] Procedure:                Upper GI endoscopy Indications:              Dysphagia Medicines:                Monitored Anesthesia Care Procedure:                Pre-Anesthesia Assessment:                           - Prior to the procedure, a History and Physical                            was performed, and patient medications and                            allergies were reviewed. The patient's tolerance of                            previous anesthesia was also reviewed. The risks                            and benefits of the procedure and the sedation                            options and risks were discussed with the patient.                            All questions were answered, and informed consent                            was obtained. Prior Anticoagulants: The patient has                            taken no previous anticoagulant or antiplatelet                            agents. ASA Grade Assessment: II - A patient with                            mild systemic disease. After reviewing the risks                            and benefits, the patient was deemed in                            satisfactory condition to undergo the procedure.                           After obtaining informed consent, the endoscope was  passed under direct vision. Throughout the                            procedure, the patient's blood pressure, pulse, and                            oxygen saturations were monitored continuously. The                            Endoscope was introduced through the mouth, and                            advanced to the second part of duodenum. The upper                            GI endoscopy was accomplished without difficulty.                             The patient tolerated the procedure well. Scope In: Scope Out: Findings:                 The esophagus was normal. There were few circular                            furrows in the proximal and midesophagus                            ?Importance. No strictures. Multiple biopsies were                            obtained from the proximal, mid and distal                            esophagus to rule out EoE. Z-line was normal at 40                            cm with few healed distal esophageal erosions.                            Multiple biopsies were obtained from all 4                            quadrants and sent for histology, directed by NBI.                            It was decided, however, to proceed with dilation                            of the entire esophagus. The scope was withdrawn.                            Dilation was performed with a Maloney dilator with  mild resistance at 50 Fr and 52 Fr. Biopsies were                            taken with a cold forceps for histology.                           Localized mild inflammation characterized by                            erythema was found in the gastric antrum. Biopsies                            were taken with a cold forceps for histology. The                            retroflexed examination of the cardia was normal.                           Five 2 to 3 mm sessile polyps with no bleeding and                            no stigmata of recent bleeding were found in the                            gastric fundus and in the gastric body. 2 were                            removed with a cold biopsy forceps. Resection and                            retrieval were complete.                           The examined duodenum was normal. Biopsies for                            histology were taken with a cold forceps for                            evaluation of celiac  disease. Complications:            No immediate complications. Estimated Blood Loss:     Estimated blood loss: none. Impression:               -Few circular furrows in the proximal and                            midesophagus (biopsied to r/o EoE).                           -Mild gastritis.                           -Incidental gastric polyps. (Resected and retrieved  x 2) Recommendation:           - Patient has a contact number available for                            emergencies. The signs and symptoms of potential                            delayed complications were discussed with the                            patient. Return to normal activities tomorrow.                            Written discharge instructions were provided to the                            patient.                           - Post dilatation diet.                           - Continue present medications including omeprazole                            20 mg p.o. twice daily.                           - Await pathology results.                           - Avoid ibuprofen, naproxen, or other non-steroidal                            anti-inflammatory drugs.                           - The findings and recommendations were discussed                            with the patient's mom Marcie Bal. Jackquline Denmark, MD 01/03/2021 2:51:45 PM This report has been signed electronically.

## 2021-01-04 NOTE — Progress Notes (Signed)
Agree with plan RG

## 2021-01-05 ENCOUNTER — Telehealth: Payer: Self-pay

## 2021-01-05 ENCOUNTER — Telehealth: Payer: Self-pay | Admitting: *Deleted

## 2021-01-05 NOTE — Telephone Encounter (Signed)
  Follow up Call-  Call back number 01/03/2021  Post procedure Call Back phone  # [phone]  Permission to leave phone message Yes  Some recent data might be hidden     Patient questions:  Do you have a fever, pain , or abdominal swelling? No. Pain Score  0 *  Have you tolerated food without any problems? Yes.    Have you been able to return to your normal activities? Yes.    Do you have any questions about your discharge instructions: Diet   No. Medications  No. Follow up visit  No.  Do you have questions or concerns about your Care? No.  Actions: * If pain score is 4 or above: No action needed, pain <4.  1. Have you developed a fever since your procedure? no  2.   Have you had an respiratory symptoms (SOB or cough) since your procedure? no  3.   Have you tested positive for COVID 19 since your procedure no  4.   Have you had any family members/close contacts diagnosed with the COVID 19 since your procedure?  no   If yes to any of these questions please route to Joylene John, RN and Joella Prince, RN

## 2021-01-05 NOTE — Telephone Encounter (Signed)
Left message on follow up call. 

## 2021-01-09 ENCOUNTER — Encounter: Payer: Self-pay | Admitting: Gastroenterology

## 2021-01-17 ENCOUNTER — Telehealth: Payer: Self-pay | Admitting: Nurse Practitioner

## 2021-01-17 ENCOUNTER — Telehealth: Payer: Self-pay | Admitting: Rheumatology

## 2021-01-17 NOTE — Telephone Encounter (Signed)
Please advise if ok to change from 20 MB BID to 40 QD.

## 2021-01-17 NOTE — Progress Notes (Signed)
Office Visit Note  Patient: Lynn Ponce             Date of Birth: 25-Aug-1976           MRN: [id number]             PCP: Antonietta Jewel, MD Referring: Antonietta Jewel, MD Visit Date: 01/18/2021 Occupation: @GUAROCC @  Subjective:  Right knee pain    History of Present Illness: Lynn Ponce is a 45 y.o. female with history of autoimmune disease, gout, and DDD. She is taking plaquenil 200 mg 1 tablet by mouth twice daily. She has been tolerating PLQ without any side effects. She presents today with increased discomfort in the right knee joint.  She denies any recent falls or injuries. She is not having any mechanical symptoms.  She denies any joint swelling.  She has started to walk 1 mile every other day for exercise, which she feels may have exacerbated her discomfort.  She walks with a cane to assist with ambulation.  She continues to have chronic lower back pain, which is typically worse in the morning. She has intermittent nocturnal pain.  She takes oxycodone as needed for pain relief.   She denies any recent rashes, raynaud's, hair loss, oral or nasal ulcers, or swollen lymph nodes.  She has ongoing sicca symptoms and uses OTC eye drops and biotene oral rinse for symptomatic relief.   She denies any SOB or palpitations.  She denies any recent gout flares. She continues to take allopurinol 300 mg 1 tablet by mouth daily and colchicine 0.6 mg 1 capsule by mouth daily.  She needs a refill of colchicine today.   Activities of Daily Living:  Patient reports morning stiffness for 2-3 hours.   Patient Reports nocturnal pain.  Difficulty dressing/grooming: Reports Difficulty climbing stairs: Reports Difficulty getting out of chair: Reports Difficulty using hands for taps, buttons, cutlery, and/or writing: Reports  Review of Systems  Constitutional: Positive for fatigue.  HENT: Negative for mouth sores, mouth dryness and nose dryness.   Eyes: Positive for itching. Negative for pain and  dryness.  Respiratory: Negative for shortness of breath and difficulty breathing.   Cardiovascular: Negative for chest pain and palpitations.  Gastrointestinal: Negative for blood in stool, constipation and diarrhea.  Endocrine: Negative for increased urination.  Genitourinary: Negative for difficulty urinating.  Musculoskeletal: Positive for arthralgias, joint pain, joint swelling and morning stiffness. Negative for myalgias, muscle tenderness and myalgias.  Skin: Negative for color change, rash and redness.  Allergic/Immunologic: Positive for susceptible to infections.  Neurological: Positive for headaches and weakness. Negative for dizziness, numbness and memory loss.  Hematological: Positive for bruising/bleeding tendency.  Psychiatric/Behavioral: Negative for confusion.    PMFS History:  Patient Active Problem List   Diagnosis Date Noted  . Dysphagia 01/02/2021  . OSA (obstructive sleep apnea) 10/30/2018  . Medication overuse headache 10/30/2018  . Idiopathic chronic gout of left knee without tophus 03/22/2017  . Tobacco use 03/22/2017  . History of depression/ with past suicide attempt 03/22/2017  . History of IBS 03/22/2017  . History of diabetes mellitus 03/22/2017  . History of diabetic neuropathy 03/22/2017  . Hair thinning 03/21/2017  . History of kidney stones 03/21/2017  . History of asthma 03/21/2017  . History of migraine 03/21/2017  . Chronic migraine w/o aura w/o status migrainosus, not intractable 03/08/2016  . Alteration consciousness 11/08/2015  . Migraine 11/08/2015  . CELLULITIS/ABSCESS, ARM 07/15/2008  . HYPERTHYROIDISM 06/14/2008  . MORBID OBESITY 06/14/2008  .  ANXIETY 06/14/2008  . MIGRAINE, CLASSICAL 06/14/2008  . ASTHMA 06/14/2008  . MENOPAUSE, SURGICAL 06/14/2008  . LYMPHADENOPATHY 06/14/2008  . Hx of cholecystectomy 06/14/2008    Past Medical History:  Diagnosis Date  . Anxiety   . Arthritis   . Asthma   . Cellulitis 08/2018   Right leg,  hospitalized for a week   . Depression   . Diabetes mellitus without complication ([place])   . GERD (gastroesophageal reflux disease)   . Hyperlipemia   . Hypertension   . Hypothyroidism   . Neuromuscular disorder ([place])   . Obesity   . Renal disorder   . Sepsis ([place]) 08/2018   hospitalized in ICU   . Sleep apnea     Family History  Problem Relation Age of Onset  . Retinal detachment Sister   . Neuromuscular disorder Sister        NMO- has tremor   . Asthma Son   . Asthma Daughter   . Stomach cancer Maternal Grandmother   . Diabetes Maternal Grandmother   . Seizures Neg Hx   . Migraines Neg Hx    Past Surgical History:  Procedure Laterality Date  . ADENOIDECTOMY    . ANKLE SURGERY     x1  . CHOLECYSTECTOMY    . FOOT SURGERY     x4  . OTHER SURGICAL HISTORY     lap for ovaries x6  . TONSILLECTOMY    . UPPER GI ENDOSCOPY  2022  . VAGINAL HYSTERECTOMY     Social History   Social History Narrative   Lives at home alone    Right handed   Caffeine use: 1 cup per day (soda/coffee)   Immunization History  Administered Date(s) Administered  . PFIZER(Purple Top)SARS-COV-2 Vaccination 07/14/2020, 08/11/2020  . Tdap 12/13/2013     Objective: Vital Signs: BP 107/78 (BP Location: Right Arm, Patient Position: Sitting, Cuff Size: Large)   Pulse 94   Resp 17   Ht 5' 8"  (1.727 m)   Wt (!) 314 lb 12.8 oz (142.8 kg)   BMI 47.87 kg/m    Physical Exam Vitals and nursing note reviewed.  Constitutional:      Appearance: She is well-developed and well-nourished.  HENT:     Head: Normocephalic and atraumatic.  Eyes:     Extraocular Movements: EOM normal.     Conjunctiva/sclera: Conjunctivae normal.  Cardiovascular:     Pulses: Intact distal pulses.  Pulmonary:     Effort: Pulmonary effort is normal.  Abdominal:     General: Bowel sounds are normal.     Palpations: Abdomen is soft.  Musculoskeletal:     Cervical back: Normal range of motion.  Skin:    General: Skin  is warm and dry.     Capillary Refill: Capillary refill takes less than 2 seconds.     Comments: Small wound on palmar aspect of left hand with surrounding erythema. No purulent drainage noted.  No cellulitis noted.   Neurological:     Mental Status: She is alert and oriented to person, place, and time.  Psychiatric:        Mood and Affect: Mood and affect normal.        Behavior: Behavior normal.      Musculoskeletal Exam: C-spine good ROM.  Limited and painful ROM of lumbar spine.  Shoulder joints, elbow joints, wrist joints, MCPs, PIPs, and DIPs good ROM with no synovitis.  Complete fist formation bilaterally.  Hip joints good ROM.  Right knee joint  has painful ROM with warmth.  Left knee has good ROM with no warmth or effusion.  Ankle joints good ROM with no discomfort.  Pedal edema noted bilaterally.   CDAI Exam: CDAI Score: -- Patient Global: --; Provider Global: -- Swollen: --; Tender: -- Joint Exam 01/18/2021   No joint exam has been documented for this visit   There is currently no information documented on the homunculus. Go to the Rheumatology activity and complete the homunculus joint exam.  Investigation: No additional findings.  Imaging: XR KNEE 3 VIEW RIGHT  Result Date: 01/18/2021 Mild medial compartment narrowing was noted.  Moderate patellofemoral narrowing was noted. Impression: These findings are consistent mild osteoarthritis and moderate chondromalacia patella.   Recent Labs: Lab Results  Component Value Date   WBC 6.4 08/03/2020   HGB 14.5 08/03/2020   PLT 192 08/03/2020   NA 141 08/03/2020   K 4.2 08/03/2020   CL 103 08/03/2020   CO2 22 08/03/2020   GLUCOSE 236 (H) 08/03/2020   BUN 15 08/03/2020   CREATININE 1.28 (H) 08/03/2020   BILITOT 0.3 08/03/2020   ALKPHOS 107 08/03/2020   AST 44 (H) 08/03/2020   ALT 24 08/03/2020   PROT 7.2 08/03/2020   ALBUMIN 4.7 08/03/2020   CALCIUM 9.6 08/03/2020   GFRAA 58 (L) 08/03/2020    Speciality Comments:  PLQ Eye Exam: 05/27/2020 normal @ [facility].  Procedures:  Large Joint Inj: R knee on 01/18/2021 10:04 AM Indications: pain Details: 27 G 1.5 in needle, medial approach  Arthrogram: No  Medications: 1.5 mL lidocaine 1 %; 40 mg triamcinolone acetonide 40 MG/ML Aspirate: 0 mL Outcome: tolerated well, no immediate complications Procedure, treatment alternatives, risks and benefits explained, specific risks discussed. Consent was given by the patient. Immediately prior to procedure a time out was called to verify the correct patient, procedure, equipment, support staff and site/side marked as required. Patient was prepped and draped in the usual sterile fashion.     Allergies: Sulfonamide derivatives, Erythromycin, Ibuprofen, Mushroom extract complex, Toradol [ketorolac tromethamine], Varenicline, and Azithromycin   Assessment / Plan:     Visit Diagnoses: Autoimmune disease ([place]) - +ANA1:320NH, ENA-, acl,LA,b2 negative , history of fatigue, dry mouth, oral ulcers, photosensitivity, arthralgias:  She has not had any signs or symptoms of a flare.  She is clinically doing well taking plaquenil 200 mg 1 tablet by mouth twice daily.  She is tolerating PLQ without any side effects.  She presents today with right knee joint pain.  She has not had any recent injuries or falls but has been walking 1 mile every other day for exercise which she feels has exacerbated her discomfort.  X-rays of the right knee were obtained today which were consistent with mild osteoarthritis and moderate chondromalacia patella.  The right knee joint was injected with cortisone today.  We will apply for Visco gel injections for the right knee.  She has no other joint tenderness or synovitis on exam.  She has not had any recent rashes, symptoms of Raynaud's, hair loss, oral or nasal ulcerations, cervical lymphadenopathy, shortness of breath, or palpitations.  She has ongoing photosensitivity and tries to avoid direct sun exposure.   She was encouraged to wear sunscreen on a daily basis.  She continues to have chronic sicca symptoms and uses over-the-counter eyedrops and Biotene products for symptomatic relief.  Lab work from 07/27/2020 was reviewed with the patient today in the office.  She is due to update autoimmune lab work today.  Orders were released.  She will continue taking Plaquenil as prescribed.  She does not need a refill at this time.  She was advised to notify us if she develops any new or worsening symptoms.  She will follow-up in the office in 5 months.  Plan: CBC with Differential/Platelet, COMPLETE METABOLIC PANEL WITH GFR, Anti-DNA antibody, double-stranded, C3 and C4, Sedimentation rate, Urinalysis, Routine w reflex microscopic  High risk medication use -Plaquenil 200 mg 1 tablet by mouth twice daily.  Plan: CBC with Differential/Platelet, COMPLETE METABOLIC PANEL WITH GFR  Idiopathic chronic gout of left knee without tophus - She has not had any recent gout flares.  She is clinically doing well taking allopurinol 300 mg 1 tablet by mouth daily and colchicine 0.6 mg 1 capsule by mouth daily as needed.  We will check uric acid level today.  Plan: Uric acid, Colchicine 0.6 MG CAPS  Primary osteoarthritis of right knee: She presents today with increased discomfort in her right knee joint.  She started to walk 1 mile every other day for exercise which she feels is exacerbated her discomfort.  She has not had any recent falls or injuries.  She is not experiencing any mechanical symptoms.  She has painful range of motion with mild warmth on examination today.  No effusion was noted.  X-rays of the right knee joint were updated today which were consistent with mild osteoarthritis and moderate chondromalacia patella.  The right knee joint was injected with cortisone.  She tolerated procedure well.  Aftercare was discussed.  Procedure note was completed above.  She was advised to monitor her blood pressure and blood sugar  closely after the cortisone injection.  She will notify her PCP if her blood sugar goes over 300.  We will also apply for Visco gel injections for the right knee.  She will continue to use a cane to assist with ambulation. She was also given a handout of knee exercises to perform.  Chronic pain of right knee -She presents today with increased pain in the right knee joint.  The right knee joint was injected with cortisone today.  We will apply for Visco gel injections for the right knee.  The right knee joint x-ray was updated which was consistent with mild osteoarthritis and moderate chondromalacia patella.  Plan: XR KNEE 3 VIEW RIGHT, Large Joint Inj: R knee  Primary osteoarthritis of both feet: She is not having any discomfort in her feet at this time. She has good ROM of both ankle joints with no tenderness.  Pedal edema noted bilaterally.   DDD (degenerative disc disease), lumbar: Chronic pain.  She has no symptoms of radiculopathy at this time.  She uses a cane to assist with ambulation.  She takes oxycodone as needed for pain relief.  Wound infection: The patient presents today with an open wound on the palmar aspect of her left hand.  According to the patient several days ago she was cutting up a credit card and the scissors slipped and punctured her left palm.  She cleaned the wound and it seemed to be healing initially but she has started to have some increased swelling and redness around the wound.  No purulent drainage was noted.  No surrounding cellulitis was noted at this time.  Due to her history of type 2 diabetes and use of Plaquenil a prescription for Keflex 500 mg 4 times daily for 7 days will be sent to the pharmacy.  She was advised to clean the wound daily  and apply Neosporin topically.  She was advised to follow-up with her PCP if the wound persists or worsens.  Other medical conditions are listed as follows:   History of cholecystectomy  History of asthma  History of diabetes  mellitus  History of lymphadenopathy  History of anxiety  History of IBS  History of diabetic neuropathy  History of depression  History of migraine  History of kidney stones    Orders: Orders Placed This Encounter  Procedures  . Large Joint Inj: R knee  . XR KNEE 3 VIEW RIGHT  . CBC with Differential/Platelet  . COMPLETE METABOLIC PANEL WITH GFR  . Uric acid  . Anti-DNA antibody, double-stranded  . C3 and C4  . Sedimentation rate  . Urinalysis, Routine w reflex microscopic   Meds ordered this encounter  Medications  . Colchicine 0.6 MG CAPS    Sig: Take one capsule by mouth daily. May take 1 capsule by mouth twice daily during flare.    Dispense:  90 capsule    Refill:  0  . cephALEXin (KEFLEX) 500 MG capsule    Sig: Take 1 capsule (500 mg total) by mouth 4 (four) times daily.    Dispense:  28 capsule    Refill:  0      Follow-Up Instructions: Return in about 5 months (around 06/20/2021) for Autoimmune Disease, Gout, DDD.   Ofilia Neas, PA-C  Note - This record has been created using Dragon software.  Chart creation errors have been sought, but may not always  have been located. Such creation errors do not reflect on  the standard of medical care.

## 2021-01-17 NOTE — Telephone Encounter (Signed)
Spoke with patient and advised she is due to update labs and due for a follow up visit. Patient scheduled for an appointment on 01/18/2021 at 9:40 am. Will send prescription at her appointment.

## 2021-01-17 NOTE — Telephone Encounter (Signed)
Patient calling in reference to Colchicine. Patient does not have a current rx for medication. Patient has been taking 1 tab per day, and is now out. Patient uses [facility]. Please call to advise.

## 2021-01-17 NOTE — Telephone Encounter (Signed)
Pt is requesting a change in her omeprazole to 60m instead of 239m  [facility]

## 2021-01-17 NOTE — Telephone Encounter (Signed)
Melissa, yes, ok to increase Omeprazole 74m po bid # 60, 1 additional refill thx

## 2021-01-18 ENCOUNTER — Ambulatory Visit: Payer: Self-pay

## 2021-01-18 ENCOUNTER — Telehealth: Payer: Self-pay

## 2021-01-18 ENCOUNTER — Ambulatory Visit (INDEPENDENT_AMBULATORY_CARE_PROVIDER_SITE_OTHER): Payer: Medicare Other | Admitting: Physician Assistant

## 2021-01-18 ENCOUNTER — Other Ambulatory Visit: Payer: Self-pay

## 2021-01-18 ENCOUNTER — Encounter: Payer: Self-pay | Admitting: Physician Assistant

## 2021-01-18 VITALS — BP 107/78 | HR 94 | Resp 17 | Ht 68.0 in | Wt 314.8 lb

## 2021-01-18 DIAGNOSIS — Z79899 Other long term (current) drug therapy: Secondary | ICD-10-CM

## 2021-01-18 DIAGNOSIS — G8929 Other chronic pain: Secondary | ICD-10-CM | POA: Diagnosis not present

## 2021-01-18 DIAGNOSIS — L089 Local infection of the skin and subcutaneous tissue, unspecified: Secondary | ICD-10-CM

## 2021-01-18 DIAGNOSIS — M51369 Other intervertebral disc degeneration, lumbar region without mention of lumbar back pain or lower extremity pain: Secondary | ICD-10-CM

## 2021-01-18 DIAGNOSIS — Z8709 Personal history of other diseases of the respiratory system: Secondary | ICD-10-CM

## 2021-01-18 DIAGNOSIS — M19071 Primary osteoarthritis, right ankle and foot: Secondary | ICD-10-CM

## 2021-01-18 DIAGNOSIS — M19072 Primary osteoarthritis, left ankle and foot: Secondary | ICD-10-CM

## 2021-01-18 DIAGNOSIS — T148XXA Other injury of unspecified body region, initial encounter: Secondary | ICD-10-CM

## 2021-01-18 DIAGNOSIS — M359 Systemic involvement of connective tissue, unspecified: Secondary | ICD-10-CM | POA: Diagnosis not present

## 2021-01-18 DIAGNOSIS — Z87442 Personal history of urinary calculi: Secondary | ICD-10-CM

## 2021-01-18 DIAGNOSIS — M5136 Other intervertebral disc degeneration, lumbar region: Secondary | ICD-10-CM

## 2021-01-18 DIAGNOSIS — M1711 Unilateral primary osteoarthritis, right knee: Secondary | ICD-10-CM | POA: Diagnosis not present

## 2021-01-18 DIAGNOSIS — M1A0620 Idiopathic chronic gout, left knee, without tophus (tophi): Secondary | ICD-10-CM | POA: Diagnosis not present

## 2021-01-18 DIAGNOSIS — Z9049 Acquired absence of other specified parts of digestive tract: Secondary | ICD-10-CM

## 2021-01-18 DIAGNOSIS — M25561 Pain in right knee: Secondary | ICD-10-CM

## 2021-01-18 DIAGNOSIS — Z87898 Personal history of other specified conditions: Secondary | ICD-10-CM

## 2021-01-18 DIAGNOSIS — Z8669 Personal history of other diseases of the nervous system and sense organs: Secondary | ICD-10-CM

## 2021-01-18 DIAGNOSIS — Z8659 Personal history of other mental and behavioral disorders: Secondary | ICD-10-CM

## 2021-01-18 DIAGNOSIS — Z8639 Personal history of other endocrine, nutritional and metabolic disease: Secondary | ICD-10-CM

## 2021-01-18 DIAGNOSIS — Z8719 Personal history of other diseases of the digestive system: Secondary | ICD-10-CM

## 2021-01-18 MED ORDER — LIDOCAINE HCL 1 % IJ SOLN
1.5000 mL | INTRAMUSCULAR | Status: AC | PRN
  Administered 2021-01-18: 1.5 mL

## 2021-01-18 MED ORDER — COLCHICINE 0.6 MG PO CAPS: ORAL_CAPSULE | ORAL | 0 refills | Status: DC

## 2021-01-18 MED ORDER — CEPHALEXIN 500 MG PO CAPS: 500.0000 mg | ORAL_CAPSULE | Freq: Four times a day (QID) | ORAL | 0 refills | Status: DC

## 2021-01-18 MED ORDER — TRIAMCINOLONE ACETONIDE 40 MG/ML IJ SUSP
40.0000 mg | INTRAMUSCULAR | Status: AC | PRN
Start: 2021-01-18 — End: 2021-01-18
  Administered 2021-01-18: 40 mg via INTRA_ARTICULAR

## 2021-01-18 NOTE — Patient Instructions (Signed)
Journal for Nurse Practitioners, 15(4), [phone]. Retrieved August 25, 2018 from [email or web address].[email or web address]">  Knee Exercises Ask your health care provider which exercises are safe for you. Do exercises exactly as told by your health care provider and adjust them as directed. It is normal to feel mild stretching, pulling, tightness, or discomfort as you do these exercises. Stop right away if you feel sudden pain or your pain gets worse. Do not begin these exercises until told by your health care provider. Stretching and range-of-motion exercises These exercises warm up your muscles and joints and improve the movement and flexibility of your knee. These exercises also help to relieve pain and swelling. Knee extension, prone 1. Lie on your abdomen (prone position) on a bed. 2. Place your left / right knee just beyond the edge of the surface so your knee is not on the bed. You can put a towel under your left / right thigh just above your kneecap for comfort. 3. Relax your leg muscles and allow gravity to straighten your knee (extension). You should feel a stretch behind your left / right knee. 4. Hold this position for __________ seconds. 5. Scoot up so your knee is supported between repetitions. Repeat __________ times. Complete this exercise __________ times a day. Knee flexion, active 1. Lie on your back with both legs straight. If this causes back discomfort, bend your left / right knee so your foot is flat on the floor. 2. Slowly slide your left / right heel back toward your buttocks. Stop when you feel a gentle stretch in the front of your knee or thigh (flexion). 3. Hold this position for __________ seconds. 4. Slowly slide your left / right heel back to the starting position. Repeat __________ times. Complete this exercise __________ times a day.   Quadriceps stretch, prone 1. Lie on your abdomen on a firm surface, such as a bed or padded floor. 2. Bend your left / right knee and hold  your ankle. If you cannot reach your ankle or pant leg, loop a belt around your foot and grab the belt instead. 3. Gently pull your heel toward your buttocks. Your knee should not slide out to the side. You should feel a stretch in the front of your thigh and knee (quadriceps). 4. Hold this position for __________ seconds. Repeat __________ times. Complete this exercise __________ times a day.   Hamstring, supine 1. Lie on your back (supine position). 2. Loop a belt or towel over the ball of your left / right foot. The ball of your foot is on the walking surface, right under your toes. 3. Straighten your left / right knee and slowly pull on the belt to raise your leg until you feel a gentle stretch behind your knee (hamstring). ? Do not let your knee bend while you do this. ? Keep your other leg flat on the floor. 4. Hold this position for __________ seconds. Repeat __________ times. Complete this exercise __________ times a day. Strengthening exercises These exercises build strength and endurance in your knee. Endurance is the ability to use your muscles for a long time, even after they get tired. Quadriceps, isometric This exercise stretches the muscles in front of your thigh (quadriceps) without moving your knee joint (isometric). 1. Lie on your back with your left / right leg extended and your other knee bent. Put a rolled towel or small pillow under your knee if told by your health care provider. 2. Slowly tense the muscles in the front of your  left / right thigh. You should see your kneecap slide up toward your hip or see increased dimpling just above the knee. This motion will push the back of the knee toward the floor. 3. For __________ seconds, hold the muscle as tight as you can without increasing your pain. 4. Relax the muscles slowly and completely. Repeat __________ times. Complete this exercise __________ times a day.   Straight leg raises This exercise stretches the muscles in  front of your thigh (quadriceps) and the muscles that move your hips (hip flexors). 1. Lie on your back with your left / right leg extended and your other knee bent. 2. Tense the muscles in the front of your left / right thigh. You should see your kneecap slide up or see increased dimpling just above the knee. Your thigh may even shake a bit. 3. Keep these muscles tight as you raise your leg 4-6 inches (10-15 cm) off the floor. Do not let your knee bend. 4. Hold this position for __________ seconds. 5. Keep these muscles tense as you lower your leg. 6. Relax your muscles slowly and completely after each repetition. Repeat __________ times. Complete this exercise __________ times a day. Hamstring, isometric 1. Lie on your back on a firm surface. 2. Bend your left / right knee about __________ degrees. 3. Dig your left / right heel into the surface as if you are trying to pull it toward your buttocks. Tighten the muscles in the back of your thighs (hamstring) to "dig" as hard as you can without increasing any pain. 4. Hold this position for __________ seconds. 5. Release the tension gradually and allow your muscles to relax completely for __________ seconds after each repetition. Repeat __________ times. Complete this exercise __________ times a day. Hamstring curls If told by your health care provider, do this exercise while wearing ankle weights. Begin with __________ lb weights. Then increase the weight by 1 lb (0.5 kg) increments. Do not wear ankle weights that are more than __________ lb. 1. Lie on your abdomen with your legs straight. 2. Bend your left / right knee as far as you can without feeling pain. Keep your hips flat against the floor. 3. Hold this position for __________ seconds. 4. Slowly lower your leg to the starting position. Repeat __________ times. Complete this exercise __________ times a day.   Squats This exercise strengthens the muscles in front of your thigh and knee  (quadriceps). 1. Stand in front of a table, with your feet and knees pointing straight ahead. You may rest your hands on the table for balance but not for support. 2. Slowly bend your knees and lower your hips like you are going to sit in a chair. ? Keep your weight over your heels, not over your toes. ? Keep your lower legs upright so they are parallel with the table legs. ? Do not let your hips go lower than your knees. ? Do not bend lower than told by your health care provider. ? If your knee pain increases, do not bend as low. 3. Hold the squat position for __________ seconds. 4. Slowly push with your legs to return to standing. Do not use your hands to pull yourself to standing. Repeat __________ times. Complete this exercise __________ times a day. Wall slides This exercise strengthens the muscles in front of your thigh and knee (quadriceps). 1. Lean your back against a smooth wall or door, and walk your feet out 18-24 inches (46-61 cm) from it. 2.  Place your feet hip-width apart. 3. Slowly slide down the wall or door until your knees bend __________ degrees. Keep your knees over your heels, not over your toes. Keep your knees in line with your hips. 4. Hold this position for __________ seconds. Repeat __________ times. Complete this exercise __________ times a day.   Straight leg raises This exercise strengthens the muscles that rotate the leg at the hip and move it away from your body (hip abductors). 1. Lie on your side with your left / right leg in the top position. Lie so your head, shoulder, knee, and hip line up. You may bend your bottom knee to help you keep your balance. 2. Roll your hips slightly forward so your hips are stacked directly over each other and your left / right knee is facing forward. 3. Leading with your heel, lift your top leg 4-6 inches (10-15 cm). You should feel the muscles in your outer hip lifting. ? Do not let your foot drift forward. ? Do not let your  knee roll toward the ceiling. 4. Hold this position for __________ seconds. 5. Slowly return your leg to the starting position. 6. Let your muscles relax completely after each repetition. Repeat __________ times. Complete this exercise __________ times a day.   Straight leg raises This exercise stretches the muscles that move your hips away from the front of the pelvis (hip extensors). 1. Lie on your abdomen on a firm surface. You can put a pillow under your hips if that is more comfortable. 2. Tense the muscles in your buttocks and lift your left / right leg about 4-6 inches (10-15 cm). Keep your knee straight as you lift your leg. 3. Hold this position for __________ seconds. 4. Slowly lower your leg to the starting position. 5. Let your leg relax completely after each repetition. Repeat __________ times. Complete this exercise __________ times a day. This information is not intended to replace advice given to you by your health care provider. Make sure you discuss any questions you have with your health care provider. Document Revised: 08/26/2018 Document Reviewed: 08/26/2018 Elsevier Patient Education  2021 [employer].

## 2021-01-18 NOTE — Telephone Encounter (Signed)
I called patient

## 2021-01-18 NOTE — Telephone Encounter (Signed)
I have sent the prescription for keflex to the pharmacy.

## 2021-01-18 NOTE — Telephone Encounter (Signed)
Patient called checking the status of her prescription of Keflex that Lovena Le was sending to [facility].  Patient states the pharmacy received the prescription of Colchicine but not the Keflex.

## 2021-01-18 NOTE — Telephone Encounter (Signed)
Please apply for right knee visco, per Hazel Sams, PA-C. Thanks!

## 2021-01-19 LAB — CBC WITH DIFFERENTIAL/PLATELET
Absolute Monocytes: 338 cells/uL (ref 200–950)
Basophils Absolute: 72 cells/uL (ref 0–200)
Basophils Relative: 1 %
Eosinophils Absolute: 374 cells/uL (ref 15–500)
Eosinophils Relative: 5.2 %
HCT: 43.6 % (ref 35.0–45.0)
Hemoglobin: 14.6 g/dL (ref 11.7–15.5)
Lymphs Abs: 2434 cells/uL (ref 850–3900)
MCH: 30.2 pg (ref 27.0–33.0)
MCHC: 33.5 g/dL (ref 32.0–36.0)
MCV: 90.3 fL (ref 80.0–100.0)
MPV: 11 fL (ref 7.5–12.5)
Monocytes Relative: 4.7 %
Neutro Abs: 3982 cells/uL (ref 1500–7800)
Neutrophils Relative %: 55.3 %
Platelets: 214 10*3/uL (ref 140–400)
RBC: 4.83 10*6/uL (ref 3.80–5.10)
RDW: 13.3 % (ref 11.0–15.0)
Total Lymphocyte: 33.8 %
WBC: 7.2 10*3/uL (ref 3.8–10.8)

## 2021-01-19 LAB — COMPLETE METABOLIC PANEL WITH GFR
AG Ratio: 1.8 (calc) (ref 1.0–2.5)
ALT: 22 U/L (ref 6–29)
AST: 21 U/L (ref 10–35)
Albumin: 4.5 g/dL (ref 3.6–5.1)
Alkaline phosphatase (APISO): 83 U/L (ref 31–125)
BUN/Creatinine Ratio: 13 (calc) (ref 6–22)
BUN: 16 mg/dL (ref 7–25)
CO2: 22 mmol/L (ref 20–32)
Calcium: 9.4 mg/dL (ref 8.6–10.2)
Chloride: 109 mmol/L (ref 98–110)
Creat: 1.22 mg/dL — ABNORMAL HIGH (ref 0.50–1.10)
GFR, Est African American: 62 mL/min/{1.73_m2} (ref >=60)
GFR, Est Non African American: 53 mL/min/{1.73_m2} — ABNORMAL LOW (ref >=60)
Globulin: 2.5 g/dL (calc) (ref 1.9–3.7)
Glucose, Bld: 189 mg/dL — ABNORMAL HIGH (ref 65–99)
Potassium: 4.4 mmol/L (ref 3.5–5.3)
Sodium: 142 mmol/L (ref 135–146)
Total Bilirubin: 0.4 mg/dL (ref 0.2–1.2)
Total Protein: 7 g/dL (ref 6.1–8.1)

## 2021-01-19 LAB — URINALYSIS, ROUTINE W REFLEX MICROSCOPIC
Bacteria, UA: NONE SEEN /HPF
Bilirubin Urine: NEGATIVE
Glucose, UA: NEGATIVE
Hgb urine dipstick: NEGATIVE
Hyaline Cast: NONE SEEN /LPF
Nitrite: NEGATIVE
Specific Gravity, Urine: 1.025 (ref 1.001–1.03)
pH: <=5 (ref 5.0–8.0)

## 2021-01-19 LAB — URIC ACID: Uric Acid, Serum: 6.3 mg/dL (ref 2.5–7.0)

## 2021-01-19 LAB — ANTI-DNA ANTIBODY, DOUBLE-STRANDED: ds DNA Ab: <1 IU/mL

## 2021-01-19 LAB — C3 AND C4
C3 Complement: 189 mg/dL (ref 83–193)
C4 Complement: 45 mg/dL (ref 15–57)

## 2021-01-19 LAB — SEDIMENTATION RATE: Sed Rate: 14 mm/h (ref 0–20)

## 2021-01-19 NOTE — Progress Notes (Signed)
CBC WNL.  Creatinine is elevated-1.22 and GFR is low-53.  Please advise the patient to avoid all NSAIDs.   Glucose elevated-189.  Please advise the patient to monitor blood glucose level closely after having a cortisone injection yesterday.   Uric acid is 6.3.  ideally her uric acid level should be less than 6.  Please advise the patient to remain compliant taking allopurinol 300 mg daily and to avoid a purine rich diet.    ESR and complements WNL.  dsDNA pending.  UA revealed trace ketones, protein,and leukocytes.  Please forward lab work to PCP.

## 2021-01-19 NOTE — Progress Notes (Signed)
dsDNA is negative.  No change in therapy at this time.  Labs are not consistent with a flare.

## 2021-01-20 NOTE — Telephone Encounter (Signed)
Submitted for VOB 01/20/2021.

## 2021-01-23 MED ORDER — OMEPRAZOLE 40 MG PO CPDR: 40.0000 mg | DELAYED_RELEASE_CAPSULE | Freq: Two times a day (BID) | ORAL | 1 refills | Status: DC

## 2021-01-23 NOTE — Telephone Encounter (Signed)
RX sent

## 2021-02-07 NOTE — Telephone Encounter (Signed)
Please call patient to schedule for Visco knee injections.   Authorized for Synvisc series Right knee. Buy and Bill. Deductible does not apply. PA is not required. Insurance to cover 100% of allowable cost for injection, and 80% of medication cost. No copay required.

## 2021-02-10 NOTE — Telephone Encounter (Signed)
I LMOM for patient to call to discuss benefits, and to schedule Visco knee injections.

## 2021-02-19 LAB — COLOGUARD: COLOGUARD: POSITIVE — AB

## 2021-02-21 ENCOUNTER — Other Ambulatory Visit: Payer: Self-pay

## 2021-02-21 LAB — COLOGUARD: Cologuard: POSITIVE — AB

## 2021-04-05 ENCOUNTER — Other Ambulatory Visit: Payer: Self-pay | Admitting: Physician Assistant

## 2021-04-05 ENCOUNTER — Other Ambulatory Visit: Payer: Self-pay | Admitting: Rheumatology

## 2021-04-05 DIAGNOSIS — M359 Systemic involvement of connective tissue, unspecified: Secondary | ICD-10-CM

## 2021-04-05 NOTE — Telephone Encounter (Signed)
Next Visit: 06/21/2021  Last Visit: 01/18/2021  Last Fill: 07/18/2020  DX: Autoimmune disease   Current Dose per office note 01/18/2021, allopurinol 300 mg 1 tablet by mouth daily  Labs: 01/18/2021, CBC WNL. Creatinine is elevated-1.22 and GFR is low-53. Please advise the patient to avoid all NSAIDs.  Glucose elevated-189. Please advise the patient to monitor blood glucose level closely after having a cortisone injection yesterday.   Uric acid is 6.3. ideally her uric acid level should be less than 6. Please advise the patient to remain compliant taking allopurinol 300 mg daily and to avoid a purine rich diet.   ESR and complements WNL. dsDNA pending.  UA revealed trace ketones, protein,and leukocytes. Please forward lab work to PCP.   Okay to refill allopurinol?  

## 2021-04-05 NOTE — Telephone Encounter (Signed)
Last Visit: 01/18/2021 Next Visit: 06/21/2021 Labs: 01/18/2021, CBC WNL. Creatinine is elevated-1.22 and GFR is low-53. Please advise the patient to avoid all NSAIDs.  Glucose elevated-189. Please advise the patient to monitor blood glucose level closely after having a cortisone injection yesterday.   Uric acid is 6.3. ideally her uric acid level should be less than 6. Please advise the patient to remain compliant taking allopurinol 300 mg daily and to avoid a purine rich diet.   ESR and complements WNL. dsDNA is negative. No change in therapy at this time. Labs are not consistent with a flare  UA revealed trace ketones, protein,and leukocytes. Please forward lab work to PCP.  Eye exam:  05/27/2020  Current Dose per office note 01/18/2021, Plaquenil 200 mg 1 tablet by mouth twice daily.  DX: Autoimmune disease   Last Fill: 10/05/2020  Okay to refill Plaquenil?

## 2021-04-20 ENCOUNTER — Ambulatory Visit: Payer: Medicare Other | Admitting: Gastroenterology

## 2021-05-01 ENCOUNTER — Other Ambulatory Visit: Payer: Self-pay | Admitting: Physician Assistant

## 2021-05-01 DIAGNOSIS — M1A0620 Idiopathic chronic gout, left knee, without tophus (tophi): Secondary | ICD-10-CM

## 2021-05-02 NOTE — Telephone Encounter (Signed)
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

## 2021-05-04 ENCOUNTER — Telehealth: Payer: Self-pay

## 2021-05-04 NOTE — Telephone Encounter (Signed)
Spoke with patient. We prescribe Allopurinol, Colchicine and PLQ. Patient advised she would not need to hold her medications. Patient expressed understanding.  If you have signs or symptoms of an infection or start antibiotics: First, call your PCP for workup of your infection. Hold your medication through the infection, until you complete your antibiotics, and until symptoms resolve if you take the following: Injectable medication (Actemra, Benlysta, Cimzia, Cosentyx, Enbrel, Humira, Kevzara, Orencia, Remicade, Simponi, Stelara, Taltz, Tremfya) Methotrexate Leflunomide (Arava) Mycophenolate (Cellcept) Morrie Sheldon, Olumiant, or Rinvoq

## 2021-05-04 NOTE — Telephone Encounter (Signed)
Patient left a voicemail stating she is being treated for E coli infection and has been prescribed medication.  Patient requested a return call to let her know if she needs to discontinue taking her gout medication.

## 2021-05-16 ENCOUNTER — Other Ambulatory Visit: Payer: Self-pay | Admitting: Rheumatology

## 2021-05-16 DIAGNOSIS — M359 Systemic involvement of connective tissue, unspecified: Secondary | ICD-10-CM

## 2021-05-23 ENCOUNTER — Other Ambulatory Visit: Payer: Self-pay | Admitting: Neurology

## 2021-05-25 NOTE — Telephone Encounter (Signed)
Please call for appt

## 2021-06-07 NOTE — Progress Notes (Signed)
 Office Visit Note  Patient: Lynn Ponce             Date of Birth: 11/24/1975           MRN: [id number]             PCP: Hassan, Sami, MD Referring: Hassan, Sami, MD Visit Date: 06/21/2021 Occupation: @GUAROCC@  Subjective:  Medication monitoring   History of Present Illness: Lynn Ponce is a 45 y.o. female with history of autoimmune disease, osteoarthritis, and gout.  Patient is taking Plaquenil 200 mg 1 tablet by mouth twice daily.  She continues to tolerate Plaquenil without any side effects.  She denies any signs or symptoms of an autoimmune disease flare recently.  She continues to have chronic lower back pain and right trochanteric bursitis.  She has been having some increased discomfort in the right hip when bending her knee.  She denies any joint swelling.  She continues to use a cane to assist with ambulation.  She has not had any recent rashes or photosensitivity.  She has been wearing sunscreen SPF 70 when she plans on being outside.  She denies any facial rashes.  She has not had any hair loss.  She denies any symptoms of Raynaud's.  She has not had any oral or nasal ulcerations.  She continues to have chronic dry eyes as well as eye itching.  She denies any mouth dryness at this time.  She is not having any shortness of breath, palpitations, or pleuritic chest pain at this time. She denies any recent gout flares.  She is taking allopurinol 300 mg 1 tablet by mouth daily and colchicine 0.6 mg 1 tablet by mouth daily.  She takes colchicine twice daily if she develops signs or symptoms of a flare. Patient reports that she continues to have recurrent kidney stones.  She states that she was recently hospitalized x2 for management of sepsis following lithotripsy on 04/18/21.  She was discharged with a PICC for 10 days.    Activities of Daily Living:  Patient reports morning stiffness for 2-3 hours.   Patient Reports nocturnal pain.  Difficulty dressing/grooming: Reports Difficulty  climbing stairs: Reports Difficulty getting out of chair: Reports Difficulty using hands for taps, buttons, cutlery, and/or writing: Reports  Review of Systems  Constitutional:  Positive for fatigue.  HENT:  Positive for mouth dryness. Negative for mouth sores and nose dryness.   Eyes:  Positive for pain, itching and dryness.  Respiratory:  Negative for shortness of breath and difficulty breathing.   Cardiovascular:  Negative for chest pain and palpitations.  Gastrointestinal:  Negative for blood in stool, constipation and diarrhea.  Endocrine: Negative for increased urination.  Genitourinary:  Negative for difficulty urinating.  Musculoskeletal:  Positive for morning stiffness. Negative for joint pain, joint pain, joint swelling, myalgias, muscle tenderness and myalgias.  Skin:  Negative for color change, rash and redness.  Allergic/Immunologic: Positive for susceptible to infections.  Neurological:  Positive for dizziness, headaches and weakness. Negative for numbness and memory loss.  Hematological:  Positive for bruising/bleeding tendency.  Psychiatric/Behavioral:  Negative for confusion.    PMFS History:  Patient Active Problem List   Diagnosis Date Noted   Dysphagia 01/02/2021   OSA (obstructive sleep apnea) 10/30/2018   Medication overuse headache 10/30/2018   Idiopathic chronic gout of left knee without tophus 03/22/2017   Tobacco use 03/22/2017   History of depression/ with past suicide attempt 03/22/2017   History of IBS 03/22/2017     History of diabetes mellitus 03/22/2017   History of diabetic neuropathy 03/22/2017   Hair thinning 03/21/2017   History of kidney stones 03/21/2017   History of asthma 03/21/2017   History of migraine 03/21/2017   Chronic migraine w/o aura w/o status migrainosus, not intractable 03/08/2016   Alteration consciousness 11/08/2015   Migraine 11/08/2015   CELLULITIS/ABSCESS, ARM 07/15/2008   HYPERTHYROIDISM 06/14/2008   MORBID OBESITY  06/14/2008   ANXIETY 06/14/2008   MIGRAINE, CLASSICAL 06/14/2008   ASTHMA 06/14/2008   MENOPAUSE, SURGICAL 06/14/2008   LYMPHADENOPATHY 06/14/2008   Hx of cholecystectomy 06/14/2008    Past Medical History:  Diagnosis Date   Anxiety    Arthritis    Asthma    Cellulitis 08/2018   Right leg, hospitalized for a week    Depression    Diabetes mellitus without complication (HCC)    GERD (gastroesophageal reflux disease)    Hyperlipemia    Hypertension    Hypothyroidism    Neuromuscular disorder (HCC)    Obesity    Renal disorder    Sepsis (HCC) 08/2018   hospitalized in ICU    Sleep apnea     Family History  Problem Relation Age of Onset   Retinal detachment Sister    Neuromuscular disorder Sister        NMO- has tremor    Stomach cancer Maternal Grandmother    Diabetes Maternal Grandmother    Asthma Daughter    Asthma Son    Seizures Neg Hx    Migraines Neg Hx    Past Surgical History:  Procedure Laterality Date   ADENOIDECTOMY     ANKLE SURGERY     x1   CHOLECYSTECTOMY     COLONOSCOPY  02/11/2002   Small internal hemorrhoids. Otherwise grossly normal colonoscopy to the mid transvere colon. It was stopped due to inadequate sedation   ESOPHAGOGASTRODUODENOSCOPY  04/08/2013   Healed esophageal erosions. Small hiatal hernia. Mild gastritis.   FOOT SURGERY     x4   OTHER SURGICAL HISTORY     lap for ovaries x6   PICC LINE INSERTION     TONSILLECTOMY     UPPER GI ENDOSCOPY  2022   URETERAL STENT PLACEMENT     VAGINAL HYSTERECTOMY     Social History   Social History Narrative   Lives at home alone    Right handed   Caffeine use: 1 cup per day (soda/coffee)   Immunization History  Administered Date(s) Administered   PFIZER(Purple Top)SARS-COV-2 Vaccination 07/14/2020, 08/11/2020   Tdap 12/13/2013     Objective: Vital Signs: BP 119/86 (BP Location: Left Wrist, Patient Position: Sitting, Cuff Size: Normal)   Pulse 76   Ht 5' 8" (1.727 m)   Wt (!) 312 lb  (141.5 kg)   BMI 47.44 kg/m    Physical Exam Vitals and nursing note reviewed.  Constitutional:      Appearance: She is well-developed.  HENT:     Head: Normocephalic and atraumatic.  Eyes:     Conjunctiva/sclera: Conjunctivae normal.  Pulmonary:     Effort: Pulmonary effort is normal.  Abdominal:     Palpations: Abdomen is soft.  Musculoskeletal:     Cervical back: Normal range of motion.  Skin:    General: Skin is warm and dry.     Capillary Refill: Capillary refill takes less than 2 seconds.  Neurological:     Mental Status: She is alert and oriented to person, place, and time.  Psychiatric:          Behavior: Behavior normal.     Musculoskeletal Exam: C-spine has good range of motion with no discomfort.  Painful range of motion of lumbar spine.  Midline spinal tenderness in the thoracic and lumbar region.  Shoulder joints, elbow joints, wrist joints, MCPs, PIPs, DIPs have good range of motion with no synovitis.  She is able to make a complete fist bilaterally.  Hip joints difficult to assess in seated position.  She has tenderness palpation over the right trochanteric bursa.  Knee joints have good range of motion with no warmth or effusion.  Ankle joints have good range of motion with no tenderness or joint swelling.  No tenderness over MTP joints.  CDAI Exam: CDAI Score: -- Patient Global: --; Provider Global: -- Swollen: --; Tender: -- Joint Exam 06/21/2021   No joint exam has been documented for this visit   There is currently no information documented on the homunculus. Go to the Rheumatology activity and complete the homunculus joint exam.  Investigation: No additional findings.  Imaging: No results found.  Recent Labs: Lab Results  Component Value Date   WBC 7.2 01/18/2021   HGB 14.6 01/18/2021   PLT 214 01/18/2021   NA 142 01/18/2021   K 4.4 01/18/2021   CL 109 01/18/2021   CO2 22 01/18/2021   GLUCOSE 189 (H) 01/18/2021   BUN 16 01/18/2021    CREATININE 1.22 (H) 01/18/2021   BILITOT 0.4 01/18/2021   ALKPHOS 107 08/03/2020   AST 21 01/18/2021   ALT 22 01/18/2021   PROT 7.0 01/18/2021   ALBUMIN 4.7 08/03/2020   CALCIUM 9.4 01/18/2021   GFRAA 62 01/18/2021    Speciality Comments: PLQ Eye Exam: 05/27/2020 normal @ [facility].  Procedures:  No procedures performed Allergies: Sulfonamide derivatives, Erythromycin, Ibuprofen, Mushroom extract complex, Toradol [ketorolac tromethamine], Varenicline, and Azithromycin   Assessment / Plan:     Visit Diagnoses: Autoimmune disease (HCC) - +ANA1:320NH, ENA-, acl,LA,b2 negative , history of fatigue, dry mouth, oral ulcers, photosensitivity, arthralgias: She has not had any signs or symptoms of an autoimmune disease flare recently.  She is clinically doing well taking Plaquenil 200 mg 1 tablet by mouth twice daily.  She continues to tolerate Plaquenil without any side effects and has not missed any doses recently.  She has intermittent arthralgias and chronic lower back pain but has no synovitis on examination today.  She has not had any recent rashes and has not been experiencing any photosensitivity.  She wears sunscreen SPF 70 if she plans on being outdoors.  No Maller rash was noted on examination today.  She has not had any symptoms of Raynaud's.  She is not experiencing any shortness of breath, pleuritic chest pain, or palpitations.  She has not had any oral or nasal ulcerations recently.  She has chronic dry eyes but has not been experiencing any symptoms of dry mouth recently.  Lab work from 01/18/2021 was reviewed today in the office: ESR within normal limits, complements within normal limits, and double-stranded DNA negative.  The following lab work will be rechecked today.  Orders were released.  She will remain on Plaquenil 200 mg 1 tablet by mouth twice daily.  She does not need a refill at this time.  She was advised to notify us if she develops any signs or symptoms of a flare.  She will  follow-up in the office in 5 months.- Plan: Anti-DNA antibody, double-stranded, Sedimentation rate, C3 and C4, Urinalysis, Routine w reflex microscopic, COMPLETE METABOLIC PANEL WITH   GFR, CBC with Differential/Platelet, ANA  High risk medication use - Plaquenil 200 mg 1 tablet by mouth twice daily. PLQ Eye Exam: 05/27/2020.  Patient was advised to schedule a Plaquenil eye exam.  She was given a Plaquenil eye exam form to take with her to her next appointment.  CBC and BMP were drawn on 05/08/2021 while hospitalized.  CBC and CMP will be drawn today to monitor for drug toxicity.- Plan: COMPLETE METABOLIC PANEL WITH GFR, CBC with Differential/Platelet  Idiopathic chronic gout of left knee without tophus - She has not had any signs or symptoms of a gout flare.  She is clinically doing well taking allopurinol 300 mg 1 tablet by mouth daily and colchicine 0.6 mg 1 capsule by mouth daily.  She takes colchicine 0.6 mg 1 capsule twice daily during flares.  Uric acid was 6.3 on 01/18/2021.  We will recheck uric acid level today.  Ideally her uric acid level should be less than 6.  She will remain on allopurinol as prescribed.  Plan: Uric acid  Primary osteoarthritis of right knee:   Primary osteoarthritis of both feet: She is not experiencing any increased discomfort in her feet at this time.  She has good range of motion of both ankle joints with no tenderness or joint swelling.  DDD (degenerative disc disease), lumbar: Chronic pain.  Midline spinal tenderness in the thoracic and lumbar region noted.  She uses a cane to assist with ambulation.  She takes Percocet 10 mg 1 tablet by mouth 4 times daily for pain relief.  History of kidney stones: Recurrent. S/p right ureteral JJ stent placed by urology on 04/09/21 and s/p lithotripsy on 04/18/2021.  No surgical intervention.  She was admitted to the hospital on 04/25/2021 for management pyelonephritis with IV antibiotic therapy. Discharged with PICC line for 10  days. Advised to continue allopurinol as prescribed.   Other medical conditions are listed as follows:   Wound infection - Resolved   History of asthma  History of anxiety  History of lymphadenopathy  History of diabetes mellitus -Patient requested to have hemoglobin A1c checked today.  Plan: HgB A1c  History of cholecystectomy  History of diabetic neuropathy  History of migraine  History of depression  History of IBS  History of hyperlipidemia -Lipid panel ordered per request of patient.  Plan: Lipid panel  Orders: Orders Placed This Encounter  Procedures   Anti-DNA antibody, double-stranded   Sedimentation rate   C3 and C4   Urinalysis, Routine w reflex microscopic   COMPLETE METABOLIC PANEL WITH GFR   CBC with Differential/Platelet   Uric acid   ANA   Lipid panel   HgB A1c   No orders of the defined types were placed in this encounter.    Follow-Up Instructions: Return in about 5 months (around 11/21/2021) for Autoimmune Disease, Gout, Osteoarthritis.   Taylor M Dale, PA-C  Note - This record has been created using Dragon software.  Chart creation errors have been sought, but may not always  have been located. Such creation errors do not reflect on  the standard of medical care.  

## 2021-06-21 ENCOUNTER — Other Ambulatory Visit: Payer: Self-pay

## 2021-06-21 ENCOUNTER — Encounter: Payer: Self-pay | Admitting: Rheumatology

## 2021-06-21 ENCOUNTER — Ambulatory Visit (INDEPENDENT_AMBULATORY_CARE_PROVIDER_SITE_OTHER): Payer: Medicare Other | Admitting: Physician Assistant

## 2021-06-21 VITALS — BP 119/86 | HR 76 | Ht 68.0 in | Wt 312.0 lb

## 2021-06-21 DIAGNOSIS — M1A0620 Idiopathic chronic gout, left knee, without tophus (tophi): Secondary | ICD-10-CM

## 2021-06-21 DIAGNOSIS — M19072 Primary osteoarthritis, left ankle and foot: Secondary | ICD-10-CM

## 2021-06-21 DIAGNOSIS — Z8719 Personal history of other diseases of the digestive system: Secondary | ICD-10-CM

## 2021-06-21 DIAGNOSIS — Z8659 Personal history of other mental and behavioral disorders: Secondary | ICD-10-CM

## 2021-06-21 DIAGNOSIS — M1711 Unilateral primary osteoarthritis, right knee: Secondary | ICD-10-CM | POA: Diagnosis not present

## 2021-06-21 DIAGNOSIS — M5136 Other intervertebral disc degeneration, lumbar region: Secondary | ICD-10-CM

## 2021-06-21 DIAGNOSIS — M359 Systemic involvement of connective tissue, unspecified: Secondary | ICD-10-CM | POA: Diagnosis not present

## 2021-06-21 DIAGNOSIS — Z79899 Other long term (current) drug therapy: Secondary | ICD-10-CM | POA: Diagnosis not present

## 2021-06-21 DIAGNOSIS — Z8639 Personal history of other endocrine, nutritional and metabolic disease: Secondary | ICD-10-CM

## 2021-06-21 DIAGNOSIS — M19071 Primary osteoarthritis, right ankle and foot: Secondary | ICD-10-CM

## 2021-06-21 DIAGNOSIS — Z87442 Personal history of urinary calculi: Secondary | ICD-10-CM

## 2021-06-21 DIAGNOSIS — L089 Local infection of the skin and subcutaneous tissue, unspecified: Secondary | ICD-10-CM

## 2021-06-21 DIAGNOSIS — Z8669 Personal history of other diseases of the nervous system and sense organs: Secondary | ICD-10-CM

## 2021-06-21 DIAGNOSIS — Z8709 Personal history of other diseases of the respiratory system: Secondary | ICD-10-CM

## 2021-06-21 DIAGNOSIS — Z87898 Personal history of other specified conditions: Secondary | ICD-10-CM

## 2021-06-21 DIAGNOSIS — Z9049 Acquired absence of other specified parts of digestive tract: Secondary | ICD-10-CM

## 2021-06-21 DIAGNOSIS — T148XXA Other injury of unspecified body region, initial encounter: Secondary | ICD-10-CM

## 2021-06-22 LAB — C3 AND C4
C3 Complement: 180 mg/dL (ref 83–193)
C4 Complement: 40 mg/dL (ref 15–57)

## 2021-06-22 LAB — COMPLETE METABOLIC PANEL WITH GFR
AG Ratio: 1.7 (calc) (ref 1.0–2.5)
ALT: 14 U/L (ref 6–29)
AST: 19 U/L (ref 10–35)
Albumin: 4.4 g/dL (ref 3.6–5.1)
Alkaline phosphatase (APISO): 76 U/L (ref 31–125)
BUN/Creatinine Ratio: 16 (calc) (ref 6–22)
BUN: 18 mg/dL (ref 7–25)
CO2: 23 mmol/L (ref 20–32)
Calcium: 9.9 mg/dL (ref 8.6–10.2)
Chloride: 108 mmol/L (ref 98–110)
Creat: 1.16 mg/dL — ABNORMAL HIGH (ref 0.50–0.99)
Globulin: 2.6 g/dL (calc) (ref 1.9–3.7)
Glucose, Bld: 169 mg/dL — ABNORMAL HIGH (ref 65–99)
Potassium: 5.2 mmol/L (ref 3.5–5.3)
Sodium: 140 mmol/L (ref 135–146)
Total Bilirubin: 0.4 mg/dL (ref 0.2–1.2)
Total Protein: 7 g/dL (ref 6.1–8.1)
eGFR: 59 mL/min/{1.73_m2} — ABNORMAL LOW (ref >=60)

## 2021-06-22 LAB — ANA: Anti Nuclear Antibody (ANA): NEGATIVE

## 2021-06-22 LAB — CBC WITH DIFFERENTIAL/PLATELET
Absolute Monocytes: 279 cells/uL (ref 200–950)
Basophils Absolute: 51 cells/uL (ref 0–200)
Basophils Relative: 0.9 %
Eosinophils Absolute: 319 cells/uL (ref 15–500)
Eosinophils Relative: 5.6 %
HCT: 41.2 % (ref 35.0–45.0)
Hemoglobin: 13.4 g/dL (ref 11.7–15.5)
Lymphs Abs: 2383 cells/uL (ref 850–3900)
MCH: 29.2 pg (ref 27.0–33.0)
MCHC: 32.5 g/dL (ref 32.0–36.0)
MCV: 89.8 fL (ref 80.0–100.0)
MPV: 10.7 fL (ref 7.5–12.5)
Monocytes Relative: 4.9 %
Neutro Abs: 2668 cells/uL (ref 1500–7800)
Neutrophils Relative %: 46.8 %
Platelets: 185 10*3/uL (ref 140–400)
RBC: 4.59 10*6/uL (ref 3.80–5.10)
RDW: 14 % (ref 11.0–15.0)
Total Lymphocyte: 41.8 %
WBC: 5.7 10*3/uL (ref 3.8–10.8)

## 2021-06-22 LAB — URINALYSIS, ROUTINE W REFLEX MICROSCOPIC
Bilirubin Urine: NEGATIVE
Glucose, UA: NEGATIVE
Hgb urine dipstick: NEGATIVE
Ketones, ur: NEGATIVE
Leukocytes,Ua: NEGATIVE
Nitrite: NEGATIVE
Protein, ur: NEGATIVE
Specific Gravity, Urine: 1.016 (ref 1.001–1.035)
pH: 8 (ref 5.0–8.0)

## 2021-06-22 LAB — HEMOGLOBIN A1C
Hgb A1c MFr Bld: 7 % of total Hgb — ABNORMAL HIGH (ref <5.7)
Mean Plasma Glucose: 154 mg/dL
eAG (mmol/L): 8.5 mmol/L

## 2021-06-22 LAB — LIPID PANEL
Cholesterol: 154 mg/dL (ref <200)
HDL: 55 mg/dL (ref >=50)
LDL Cholesterol (Calc): 71 mg/dL (calc)
Non-HDL Cholesterol (Calc): 99 mg/dL (calc) (ref <130)
Total CHOL/HDL Ratio: 2.8 (calc) (ref <5.0)
Triglycerides: 212 mg/dL — ABNORMAL HIGH (ref <150)

## 2021-06-22 LAB — URIC ACID: Uric Acid, Serum: 5.2 mg/dL (ref 2.5–7.0)

## 2021-06-22 LAB — SEDIMENTATION RATE: Sed Rate: 25 mm/h — ABNORMAL HIGH (ref 0–20)

## 2021-06-22 LAB — ANTI-DNA ANTIBODY, DOUBLE-STRANDED: ds DNA Ab: 1 IU/mL
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
## 2021-06-23 NOTE — Progress Notes (Signed)
CBC WNL.  Glucose elevated-169. Creatinine is slightly elevated and GFR is borderline low-59.  Please advise the patient to avoid NSAIDs.  UA normal.  Uric acid is within the desirable range.   ANA negative.  ESR is borderline elevated-25.  Complements WNL. dsDNA is negative. Labs are not consistent with a flare.  No change in therapy recommended at this time.  Hgb A1c 7.0 and triglycerides elevated-212.  Please notify the patient and forward to PCP as requested.

## 2021-07-11 ENCOUNTER — Other Ambulatory Visit: Payer: Self-pay | Admitting: Rheumatology

## 2021-07-12 NOTE — Telephone Encounter (Signed)
Next Visit: 11/29/2021  Last Visit: 06/21/2021  Last Fill: 04/05/2021  DX: Idiopathic chronic gout of left knee without tophus   Current Dose per office note 06/21/2021: allopurinol 300 mg 1 tablet by mouth daily   Labs: 06/21/2021 CBC WNL.  Glucose elevated-169. Creatinine is slightly elevated and GFR is borderline low-59.  Please advise the patient to avoid NSAIDs.  UA normal.  Uric acid is within the desirable range.   Okay to refill Allopurinol?

## 2021-07-29 ENCOUNTER — Other Ambulatory Visit: Payer: Self-pay | Admitting: Physician Assistant

## 2021-07-29 DIAGNOSIS — M1A0620 Idiopathic chronic gout, left knee, without tophus (tophi): Secondary | ICD-10-CM

## 2021-07-31 NOTE — Telephone Encounter (Signed)
Next Visit: 11/29/2021   Last Visit: 06/21/2021   Last Fill: 05/02/2021   DX: Idiopathic chronic gout of left knee without tophus    Current Dose per office note 06/21/2021: colchicine 0.6 mg 1 capsule by mouth daily.  She takes colchicine 0.6 mg 1 capsule twice daily during flares.   Labs: 06/21/2021 CBC WNL.  Glucose elevated-169. Creatinine is slightly elevated and GFR is borderline low-59.  UA normal.  Uric acid is within the desirable range.   Okay to refill Colchicine?

## 2021-09-04 ENCOUNTER — Other Ambulatory Visit: Payer: Self-pay | Admitting: Neurology

## 2021-09-27 LAB — HM DIABETES EYE EXAM

## 2021-10-05 LAB — HM DIABETES EYE EXAM

## 2021-10-09 ENCOUNTER — Other Ambulatory Visit: Payer: Self-pay | Admitting: Nurse Practitioner

## 2021-10-09 ENCOUNTER — Other Ambulatory Visit: Payer: Self-pay | Admitting: Physician Assistant

## 2021-10-09 ENCOUNTER — Other Ambulatory Visit: Payer: Self-pay | Admitting: Neurology

## 2021-10-09 DIAGNOSIS — M1A0620 Idiopathic chronic gout, left knee, without tophus (tophi): Secondary | ICD-10-CM

## 2021-10-09 NOTE — Telephone Encounter (Signed)
Next Visit: 11/29/2021   Last Visit: 06/21/2021   Last Fill: 07/31/2021   DX: Idiopathic chronic gout of left knee without tophus    Current Dose per office note 06/21/2021: colchicine 0.6 mg 1 capsule by mouth daily.  She takes colchicine 0.6 mg 1 capsule twice daily during flares.    Labs: 06/21/2021 CBC WNL.  Glucose elevated-169. Creatinine is slightly elevated and GFR is borderline low-59.  UA normal.  Uric acid is within the desirable range.    Okay to refill Colchicine?

## 2021-11-07 ENCOUNTER — Other Ambulatory Visit: Payer: Self-pay

## 2021-11-07 DIAGNOSIS — M359 Systemic involvement of connective tissue, unspecified: Secondary | ICD-10-CM

## 2021-11-07 MED ORDER — HYDROXYCHLOROQUINE SULFATE 200 MG PO TABS: 200.0000 mg | ORAL_TABLET | Freq: Two times a day (BID) | ORAL | 0 refills | Status: DC

## 2021-11-07 NOTE — Telephone Encounter (Signed)
Patient called requesting prescription refill of Plaquenil (90 day supply) to be sent to [facility].   Patient also wanted to confirm the office received the results from her Plaquenil eye exam.

## 2021-11-07 NOTE — Telephone Encounter (Signed)
Next Visit: 11/29/2021  Last Visit: 06/21/2022  Labs: 06/21/2021 CBC WNL.  Glucose elevated-169. Creatinine is slightly elevated and GFR is borderline low-59.   Eye exam: 05/27/2020 normal   Current Dose per office note 06/21/2021: Plaquenil 200 mg 1 tablet by mouth twice daily  CV:UDTHYHOOIL disease  Last Fill: 04/05/2021  Patient advised we have not received her PLQ eye exam. Patient will re-fax.   Okay to refill Plaquenil?

## 2021-11-15 NOTE — Progress Notes (Deleted)
Office Visit Note  Patient: Lynn Ponce             Date of Birth: 07-18-1976           MRN: [id number]             PCP: Antonietta Jewel, MD Referring: Antonietta Jewel, MD Visit Date: 11/29/2021 Occupation: @GUAROCC @  Subjective:  No chief complaint on file.   History of Present Illness: Lynn Ponce is a 45 y.o. female ***   Activities of Daily Living:  Patient reports morning stiffness for *** {minute/hour:19697}.   Patient {ACTIONS;DENIES/REPORTS:21021675::"Denies"} nocturnal pain.  Difficulty dressing/grooming: {ACTIONS;DENIES/REPORTS:21021675::"Denies"} Difficulty climbing stairs: {ACTIONS;DENIES/REPORTS:21021675::"Denies"} Difficulty getting out of chair: {ACTIONS;DENIES/REPORTS:21021675::"Denies"} Difficulty using hands for taps, buttons, cutlery, and/or writing: {ACTIONS;DENIES/REPORTS:21021675::"Denies"}  No Rheumatology ROS completed.   PMFS History:  Patient Active Problem List   Diagnosis Date Noted   Dysphagia 01/02/2021   OSA (obstructive sleep apnea) 10/30/2018   Medication overuse headache 10/30/2018   Idiopathic chronic gout of left knee without tophus 03/22/2017   Tobacco use 03/22/2017   History of depression/ with past suicide attempt 03/22/2017   History of IBS 03/22/2017   History of diabetes mellitus 03/22/2017   History of diabetic neuropathy 03/22/2017   Hair thinning 03/21/2017   History of kidney stones 03/21/2017   History of asthma 03/21/2017   History of migraine 03/21/2017   Chronic migraine w/o aura w/o status migrainosus, not intractable 03/08/2016   Alteration consciousness 11/08/2015   Migraine 11/08/2015   CELLULITIS/ABSCESS, ARM 07/15/2008   HYPERTHYROIDISM 06/14/2008   MORBID OBESITY 06/14/2008   ANXIETY 06/14/2008   MIGRAINE, CLASSICAL 06/14/2008   ASTHMA 06/14/2008   MENOPAUSE, SURGICAL 06/14/2008   LYMPHADENOPATHY 06/14/2008   Hx of cholecystectomy 06/14/2008    Past Medical History:  Diagnosis Date   Anxiety     Arthritis    Asthma    Cellulitis 08/2018   Right leg, hospitalized for a week    Depression    Diabetes mellitus without complication (HCC)    GERD (gastroesophageal reflux disease)    Hyperlipemia    Hypertension    Hypothyroidism    Neuromuscular disorder ([place])    Obesity    Renal disorder    Sepsis ([place]) 08/2018   hospitalized in ICU    Sleep apnea     Family History  Problem Relation Age of Onset   Retinal detachment Sister    Neuromuscular disorder Sister        NMO- has tremor    Stomach cancer Maternal Grandmother    Diabetes Maternal Grandmother    Asthma Daughter    Asthma Son    Seizures Neg Hx    Migraines Neg Hx    Past Surgical History:  Procedure Laterality Date   ADENOIDECTOMY     ANKLE SURGERY     x1   CHOLECYSTECTOMY     COLONOSCOPY  02/11/2002   Small internal hemorrhoids. Otherwise grossly normal colonoscopy to the mid transvere colon. It was stopped due to inadequate sedation   ESOPHAGOGASTRODUODENOSCOPY  04/08/2013   Healed esophageal erosions. Small hiatal hernia. Mild gastritis.   FOOT SURGERY     x4   OTHER SURGICAL HISTORY     lap for ovaries x6   PICC LINE INSERTION     TONSILLECTOMY     UPPER GI ENDOSCOPY  2022   URETERAL STENT PLACEMENT     VAGINAL HYSTERECTOMY     Social History   Social History Narrative   Lives at home  alone    Right handed   Caffeine use: 1 cup per day (soda/coffee)   Immunization History  Administered Date(s) Administered   PFIZER(Purple Top)SARS-COV-2 Vaccination 07/14/2020, 08/11/2020   Tdap 12/13/2013     Objective: Vital Signs: There were no vitals taken for this visit.   Physical Exam   Musculoskeletal Exam: ***  CDAI Exam: CDAI Score: -- Patient Global: --; Provider Global: -- Swollen: --; Tender: -- Joint Exam 11/29/2021   No joint exam has been documented for this visit   There is currently no information documented on the homunculus. Go to the Rheumatology activity and complete the  homunculus joint exam.  Investigation: No additional findings.  Imaging: No results found.  Recent Labs: Lab Results  Component Value Date   WBC 5.7 06/21/2021   HGB 13.4 06/21/2021   PLT 185 06/21/2021   NA 140 06/21/2021   K 5.2 06/21/2021   CL 108 06/21/2021   CO2 23 06/21/2021   GLUCOSE 169 (H) 06/21/2021   BUN 18 06/21/2021   CREATININE 1.16 (H) 06/21/2021   BILITOT 0.4 06/21/2021   ALKPHOS 107 08/03/2020   AST 19 06/21/2021   ALT 14 06/21/2021   PROT 7.0 06/21/2021   ALBUMIN 4.7 08/03/2020   CALCIUM 9.9 06/21/2021   GFRAA 62 01/18/2021    Speciality Comments: PLQ Eye Exam: 10/05/2021 normal @ [facility].  Procedures:  No procedures performed Allergies: Sulfonamide derivatives, Erythromycin, Ibuprofen, Mushroom extract complex, Toradol [ketorolac tromethamine], Varenicline, and Azithromycin   Assessment / Plan:     Visit Diagnoses: No diagnosis found.  Orders: No orders of the defined types were placed in this encounter.  No orders of the defined types were placed in this encounter.   Face-to-face time spent with patient was *** minutes. Greater than 50% of time was spent in counseling and coordination of care.  Follow-Up Instructions: No follow-ups on file.   Earnestine Mealing, CMA  Note - This record has been created using Editor, commissioning.  Chart creation errors have been sought, but may not always  have been located. Such creation errors do not reflect on  the standard of medical care.

## 2021-11-21 ENCOUNTER — Other Ambulatory Visit: Payer: Self-pay | Admitting: Physician Assistant

## 2021-11-21 DIAGNOSIS — M1A0620 Idiopathic chronic gout, left knee, without tophus (tophi): Secondary | ICD-10-CM

## 2021-11-29 ENCOUNTER — Ambulatory Visit: Payer: Medicare Other | Admitting: Rheumatology

## 2021-12-04 ENCOUNTER — Other Ambulatory Visit: Payer: Self-pay | Admitting: Nurse Practitioner

## 2022-01-29 ENCOUNTER — Other Ambulatory Visit: Payer: Self-pay | Admitting: Nurse Practitioner

## 2022-02-03 ENCOUNTER — Other Ambulatory Visit: Payer: Self-pay | Admitting: Physician Assistant

## 2022-02-03 DIAGNOSIS — M359 Systemic involvement of connective tissue, unspecified: Secondary | ICD-10-CM

## 2022-02-06 ENCOUNTER — Other Ambulatory Visit: Payer: Self-pay | Admitting: Physician Assistant

## 2022-02-06 DIAGNOSIS — M359 Systemic involvement of connective tissue, unspecified: Secondary | ICD-10-CM

## 2022-02-06 DIAGNOSIS — M1A0620 Idiopathic chronic gout, left knee, without tophus (tophi): Secondary | ICD-10-CM

## 2022-03-26 ENCOUNTER — Other Ambulatory Visit: Payer: Self-pay | Admitting: Nurse Practitioner

## 2022-06-20 ENCOUNTER — Other Ambulatory Visit: Payer: Self-pay

## 2022-06-20 MED ORDER — OMEPRAZOLE 40 MG PO CPDR: DELAYED_RELEASE_CAPSULE | ORAL | 1 refills | Status: DC

## 2022-09-21 ENCOUNTER — Encounter: Payer: Self-pay | Admitting: *Deleted

## 2022-10-05 ENCOUNTER — Encounter: Payer: Self-pay | Admitting: Gastroenterology

## 2022-10-05 ENCOUNTER — Ambulatory Visit (INDEPENDENT_AMBULATORY_CARE_PROVIDER_SITE_OTHER): Payer: Medicare Other | Admitting: Gastroenterology

## 2022-10-05 VITALS — BP 122/80 | HR 75 | Ht 68.0 in | Wt 308.0 lb

## 2022-10-05 DIAGNOSIS — R1310 Dysphagia, unspecified: Secondary | ICD-10-CM

## 2022-10-05 DIAGNOSIS — R195 Other fecal abnormalities: Secondary | ICD-10-CM

## 2022-10-05 MED ORDER — PLENVU 140 G PO SOLR: 1.0000 | ORAL | 0 refills | Status: DC

## 2022-10-05 NOTE — Patient Instructions (Signed)
You have been scheduled for an endoscopy and colonoscopy. Please follow the written instructions given to you at your visit today. Please pick up your prep supplies at the pharmacy within the next 1-3 days. If you use inhalers (even only as needed), please bring them with you on the day of your procedure.  _______________________________________________________  If you are age 47 or older, your body mass index should be between 23-30. Your Body mass index is 46.83 kg/m. If this is out of the aforementioned range listed, please consider follow up with your Primary Care Provider.  If you are age 59 or younger, your body mass index should be between 19-25. Your Body mass index is 46.83 kg/m. If this is out of the aformentioned range listed, please consider follow up with your Primary Care Provider.   ________________________________________________________  The [facility] providers would like to encourage you to use [facility] to communicate with providers for non-urgent requests or questions.  Due to long hold times on the telephone, sending your provider a message by [facility] may be a faster and more efficient way to get a response.  Please allow 48 business hours for a response.  Please remember that this is for non-urgent requests.  _______________________________________________________  Due to recent changes in healthcare laws, you may see the results of your imaging and laboratory studies on MyChart before your provider has had a chance to review them.  We understand that in some cases there may be results that are confusing or concerning to you. Not all laboratory results come back in the same time frame and the provider may be waiting for multiple results in order to interpret others.  Please give [place] 48 hours in order for your provider to thoroughly review all the results before contacting the office for clarification of your results.
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
## 2022-10-08 ENCOUNTER — Ambulatory Visit: Payer: Commercial Managed Care - HMO | Admitting: Physician Assistant

## 2022-10-23 ENCOUNTER — Encounter: Payer: Self-pay | Admitting: Gastroenterology

## 2022-10-23 DIAGNOSIS — R195 Other fecal abnormalities: Secondary | ICD-10-CM | POA: Insufficient documentation

## 2022-10-24 NOTE — Progress Notes (Signed)
Agree with assessment/plan.  Raj Whitney Hillegass, MD [facility] [phone]  

## 2022-11-02 ENCOUNTER — Telehealth: Payer: Self-pay | Admitting: Gastroenterology

## 2022-11-02 NOTE — Telephone Encounter (Signed)
Plenvu is needing an approval. She wants to know if there is an alternate medication that she can take. Please advise.

## 2022-11-05 ENCOUNTER — Encounter: Payer: Self-pay | Admitting: *Deleted

## 2022-11-05 MED ORDER — NA SULFATE-K SULFATE-MG SULF 17.5-3.13-1.6 GM/177ML PO SOLN: ORAL | 0 refills | Status: DC

## 2022-11-05 NOTE — Telephone Encounter (Signed)
I have spoken to patient to advise that she may pick up a sample of Plenvu since her insurance does not cover the medication and is not cost effective with coupon. I have also advised that if she would rather, we can change her prescription to suprep which it seems her insurance will cover and I can send her updated instructions via mychart. Patient would like to use Suprep rather than getting the sample of Plenvu at the office. I have contacted [facility] to give updated script for Suprep and have also sent updated instructions via mychart for the patient. I have asked that she contact me sometime this afternoon if for some reason she has additional questions or has difficulty locating written instructions sent in mychart. She verbalizes understanding of this information.

## 2022-11-08 ENCOUNTER — Ambulatory Visit (AMBULATORY_SURGERY_CENTER): Payer: Medicare Other | Admitting: Gastroenterology

## 2022-11-08 ENCOUNTER — Encounter: Payer: Self-pay | Admitting: Gastroenterology

## 2022-11-08 VITALS — BP 94/55 | HR 81 | Temp 96.2°F | Resp 10 | Ht 68.0 in | Wt 308.0 lb

## 2022-11-08 DIAGNOSIS — K2101 Gastro-esophageal reflux disease with esophagitis, with bleeding: Secondary | ICD-10-CM

## 2022-11-08 DIAGNOSIS — D123 Benign neoplasm of transverse colon: Secondary | ICD-10-CM | POA: Diagnosis not present

## 2022-11-08 DIAGNOSIS — R195 Other fecal abnormalities: Secondary | ICD-10-CM

## 2022-11-08 DIAGNOSIS — Z1211 Encounter for screening for malignant neoplasm of colon: Secondary | ICD-10-CM

## 2022-11-08 DIAGNOSIS — R1310 Dysphagia, unspecified: Secondary | ICD-10-CM

## 2022-11-08 DIAGNOSIS — K222 Esophageal obstruction: Secondary | ICD-10-CM | POA: Diagnosis not present

## 2022-11-08 DIAGNOSIS — K2951 Unspecified chronic gastritis with bleeding: Secondary | ICD-10-CM

## 2022-11-08 DIAGNOSIS — K2970 Gastritis, unspecified, without bleeding: Secondary | ICD-10-CM

## 2022-11-08 HISTORY — PX: COLONOSCOPY WITH ESOPHAGOGASTRODUODENOSCOPY (EGD) AND ESOPHAGEAL DILATION (ED): SHX6495

## 2022-11-08 MED ORDER — SODIUM CHLORIDE 0.9 % IV SOLN: 500.0000 mL | Freq: Once | INTRAVENOUS | Status: DC

## 2022-11-08 NOTE — Progress Notes (Signed)
Called to room to assist during endoscopic procedure.  Patient ID and intended procedure confirmed with present staff. Received instructions for my participation in the procedure from the performing physician.  

## 2022-11-08 NOTE — Op Note (Signed)
[facility] Patient Name: Lynn Ponce Procedure Date: 11/08/2022 1:27 PM MRN: [id number] Endoscopist: Jackquline Denmark , MD, [id number] Age: 46 Referring MD:  Date of Birth: 04-18-1976 Gender: Female Account #: [id number] Procedure:                Upper GI endoscopy Indications:              Dysphagia Medicines:                Monitored Anesthesia Care Procedure:                Pre-Anesthesia Assessment:                           - Prior to the procedure, a History and Physical                            was performed, and patient medications and                            allergies were reviewed. The patient's tolerance of                            previous anesthesia was also reviewed. The risks                            and benefits of the procedure and the sedation                            options and risks were discussed with the patient.                            All questions were answered, and informed consent                            was obtained. Prior Anticoagulants: The patient has                            taken no anticoagulant or antiplatelet agents. ASA                            Grade Assessment: II - A patient with mild systemic                            disease. After reviewing the risks and benefits,                            the patient was deemed in satisfactory condition to                            undergo the procedure.                           After obtaining informed consent, the endoscope was  passed under direct vision. Throughout the                            procedure, the patient's blood pressure, pulse, and                            oxygen saturations were monitored continuously. The                            GIF [id number] #[id number] was introduced through the                            mouth, and advanced to the second part of duodenum.                            The upper GI endoscopy was accomplished without                             difficulty. The patient tolerated the procedure                            well. Scope In: Scope Out: Findings:                 The examined esophagus was normal with well-defined                            Z-line at 40 cm, examined by NBI. Biopsies were                            obtained from the proximal and distal esophagus                            with cold forceps for histology of suspected                            eosinophilic esophagitis. The scope was withdrawn.                            Dilation was performed with a Maloney dilator with                            mild resistance at 21 Fr and 54 Fr.                           Localized mild inflammation characterized by                            erythema was found in the gastric antrum. Biopsies                            were taken with a cold forceps for histology.  A few 2 to 4 mm sessile polyps with no stigmata of                            recent bleeding were found in the gastric body. Not                            biopsied since previously deemed to be hyperplastic.                           The examined duodenum was normal. Complications:            No immediate complications. Estimated Blood Loss:     Estimated blood loss: none. Impression:               - Mild gastritis. Biopsied.                           - S/p empiric esophageal dilatation. Recommendation:           - Patient has a contact number available for                            emergencies. The signs and symptoms of potential                            delayed complications were discussed with the                            patient. Return to normal activities tomorrow.                            Written discharge instructions were provided to the                            patient.                           - Post dilatation diet.                           - Continue present medications.                            - Await pathology results.                           - The findings and recommendations were discussed                            with the patient's family. Jackquline Denmark, MD 11/08/2022 2:12:08 PM This report has been signed electronically.

## 2022-11-08 NOTE — Progress Notes (Signed)
Pt resting comfortably. VSS. Airway intact. SBAR complete to RN. All questions answered.   
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
## 2022-11-08 NOTE — Patient Instructions (Addendum)
Read all of the handouts given to you by your recovery room nurse.  Resume all of your medicines today as ordered.  YOU HAD AN ENDOSCOPIC PROCEDURE TODAY AT [facility]:   Refer to the procedure report that was given to you for any specific questions about what was found during the examination.  If the procedure report does not answer your questions, please call your gastroenterologist to clarify.  If you requested that your care partner not be given the details of your procedure findings, then the procedure report has been included in a sealed envelope for you to review at your convenience later.  YOU SHOULD EXPECT: Some feelings of bloating in the abdomen. Passage of more gas than usual.  Walking can help get rid of the air that was put into your GI tract during the procedure and reduce the bloating. If you had a lower endoscopy (such as a colonoscopy or flexible sigmoidoscopy) you may notice spotting of blood in your stool or on the toilet paper. If you underwent a bowel prep for your procedure, you may not have a normal bowel movement for a few days.  Please Note:  You might notice some irritation and congestion in your nose or some drainage.  This is from the oxygen used during your procedure.  There is no need for concern and it should clear up in a day or so.  SYMPTOMS TO REPORT IMMEDIATELY:  Following lower endoscopy (colonoscopy or flexible sigmoidoscopy):  Excessive amounts of blood in the stool  Significant tenderness or worsening of abdominal pains  Swelling of the abdomen that is new, acute  Fever of 100F or higher  Following upper endoscopy (EGD)  Vomiting of blood or coffee ground material  New chest pain or pain under the shoulder blades  Painful or persistently difficult swallowing  New shortness of breath  Fever of 100F or higher  Black, tarry-looking stools  For urgent or emergent issues, a gastroenterologist can be reached at any hour by calling [phone]  [phone]. Do not use MyChart messaging for urgent concerns.    DIET:  We do recommend  a clear liquid diet until 315 pm.   Then a soft diet for the rest of today.  You may proceed to your regular diet tomorrow.  Drink plenty of fluids but you should avoid alcoholic beverages for 24 hours.Y ACTIVITY:  You should plan to take it easy for the rest of today and you should NOT DRIVE or use heavy machinery until tomorrow (because of the sedation medicines used during the test).    FOLLOW UP: Our staff will call the number listed on your records the next business day following your procedure.  We will call around 7:15- 8:00 am to check on you and address any questions or concerns that you may have regarding the information given to you following your procedure. If we do not reach you, we will leave a message.     If any biopsies were taken you will be contacted by phone or by letter within the next 1-3 weeks.  Please call us at [phone] if you have not heard about the biopsies in 3 weeks.    SIGNATURES/CONFIDENTIALITY: You and/or your care partner have signed paperwork which will be entered into your electronic medical record.  These signatures attest to the fact that that the information above on your After Visit Summary has been reviewed and is understood.  Full responsibility of the confidentiality of this discharge information lies  with you and/or your care-partner.

## 2022-11-08 NOTE — Op Note (Signed)
[place] Patient Name: Lynn Ponce Procedure Date: 11/08/2022 1:26 PM MRN: [id number] Endoscopist: Jackquline Denmark , MD, [id number] Age: 46 Referring MD:  Date of Birth: 09/24/1976 Gender: Female Account #: [id number] Procedure:                Colonoscopy Indications:              Positive Cologuard test Medicines:                Monitored Anesthesia Care Procedure:                Pre-Anesthesia Assessment:                           - Prior to the procedure, a History and Physical                            was performed, and patient medications and                            allergies were reviewed. The patient's tolerance of                            previous anesthesia was also reviewed. The risks                            and benefits of the procedure and the sedation                            options and risks were discussed with the patient.                            All questions were answered, and informed consent                            was obtained. Prior Anticoagulants: The patient has                            taken no anticoagulant or antiplatelet agents. ASA                            Grade Assessment: II - A patient with mild systemic                            disease. After reviewing the risks and benefits,                            the patient was deemed in satisfactory condition to                            undergo the procedure.                           After obtaining informed consent, the colonoscope  was passed under direct vision. Throughout the                            procedure, the patient's blood pressure, pulse, and                            oxygen saturations were monitored continuously. The                            Olympus CF-HQ190L ([id number]) Colonoscope was                            introduced through the anus and advanced to the 2                            cm into the ileum. The colonoscopy was  performed                            without difficulty. The patient tolerated the                            procedure well. The quality of the bowel                            preparation was good. The terminal ileum, ileocecal                            valve, appendiceal orifice, and rectum were                            photographed. Scope In: 2:14:28 PM Scope Out: 2:31:12 PM Scope Withdrawal Time: 0 hours 8 minutes 48 seconds  Total Procedure Duration: 0 hours 16 minutes 44 seconds  Findings:                 Two sessile polyps were found in the mid transverse                            colon. The polyps were 3 to 4 mm in size. These                            polyps were removed with a cold snare. Resection                            and retrieval were complete.                           Non-bleeding internal hemorrhoids were found during                            retroflexion. The hemorrhoids were small and Grade                            I (internal hemorrhoids that do not prolapse).  The terminal ileum appeared normal.                           The exam was otherwise without abnormality on                            direct and retroflexion views. Complications:            No immediate complications. Estimated Blood Loss:     Estimated blood loss: none. Impression:               - Two 3 to 4 mm polyps in the mid transverse colon,                            removed with a cold snare. Resected and retrieved.                           - Non-bleeding internal hemorrhoids.                           - The examined portion of the ileum was normal.                           - The examination was otherwise normal on direct                            and retroflexion views. Recommendation:           - Patient has a contact number available for                            emergencies. The signs and symptoms of potential                            delayed  complications were discussed with the                            patient. Return to normal activities tomorrow.                            Written discharge instructions were provided to the                            patient.                           - Resume previous diet.                           - Continue present medications.                           - Await pathology results.                           - Repeat colonoscopy for surveillance based on  pathology results.                           - The findings and recommendations were discussed                            with the patient. Jackquline Denmark, MD 11/08/2022 2:35:01 PM This report has been signed electronically.

## 2022-11-09 ENCOUNTER — Telehealth: Payer: Self-pay | Admitting: Gastroenterology

## 2022-11-09 ENCOUNTER — Telehealth: Payer: Self-pay | Admitting: *Deleted

## 2022-11-09 NOTE — Telephone Encounter (Signed)
Inbound call from patient stating that she needs a refill for Prilosec. Please advise.

## 2022-11-09 NOTE — Telephone Encounter (Signed)
  Follow up Call-     11/08/2022    1:18 PM 01/03/2021    1:41 PM  Call back number  Post procedure Call Back phone  # [phone]  Permission to leave phone message Yes Yes     Patient questions:  Do you have a fever, pain , or abdominal swelling? No. Pain Score  0 *  Have you tolerated food without any problems? Yes.    Have you been able to return to your normal activities? Yes.    Do you have any questions about your discharge instructions: Diet   No. Medications  No. Follow up visit  No.  Do you have questions or concerns about your Care? No.  Actions: * If pain score is 4 or above: No action needed, pain <4.

## 2022-11-09 NOTE — Telephone Encounter (Signed)
Are you ok with refilling her Prilosec? It says you prescribe it

## 2022-11-13 MED ORDER — OMEPRAZOLE 40 MG PO CPDR: DELAYED_RELEASE_CAPSULE | ORAL | 1 refills | Status: DC

## 2022-11-13 NOTE — Telephone Encounter (Signed)
Done

## 2022-11-15 ENCOUNTER — Telehealth: Payer: Self-pay | Admitting: Gastroenterology

## 2022-11-15 NOTE — Telephone Encounter (Signed)
Pt stated that Dr. Lyndel Safe notified her that her stomach was red and inflamed and may need an antibiotic during her recent procedures:  Pt was notified that her pathology from her procedure has not came back yet and as soon as the pathology comea back and Dr. Lyndel Safe reviews it he will make recommendations and prescribe as needed: Pt verbalized understanding with all questions answered.

## 2022-11-15 NOTE — Telephone Encounter (Signed)
Patient is calling awaiting biopsy results and updates on the antibiotic that was supposed to be prescribed to her. Please advise

## 2022-11-25 ENCOUNTER — Encounter: Payer: Self-pay | Admitting: Gastroenterology

## 2022-11-28 NOTE — Telephone Encounter (Signed)
Pt questioned her pathology results. Chart reviewed. Noted that letter was sent to pt via my chart. Letter read to pt: Pt stated that she is having ongoing abdominal pain, Pt stated that the pain is not in her throat but upper abdomen, Last BM today.  Please advise:

## 2022-11-28 NOTE — Telephone Encounter (Signed)
Patient called back, requesting results from her pathology stated it has been two weeks. Patient also states she has been having chronic abdominal pain and her symptoms have still not subsided. Please advise.

## 2023-05-05 ENCOUNTER — Other Ambulatory Visit: Payer: Self-pay | Admitting: Physician Assistant

## 2023-06-26 NOTE — Progress Notes (Unsigned)
Office Visit Note  Patient: Lynn Ponce             Date of Birth: [birth year]           MRN: [id number]             PCP: Quitman Livings, MD Referring: Quitman Livings, MD Visit Date: 06/27/2023 Occupation: @GUAROCC @  Subjective:  No chief complaint on file.   History of Present Illness: Lynn Ponce is a 47 y.o. female ***     Activities of Daily Living:  Patient reports morning stiffness for *** {minute/hour:19697}.   Patient {ACTIONS;DENIES/REPORTS:21021675::"Denies"} nocturnal pain.  Difficulty dressing/grooming: {ACTIONS;DENIES/REPORTS:21021675::"Denies"} Difficulty climbing stairs: {ACTIONS;DENIES/REPORTS:21021675::"Denies"} Difficulty getting out of chair: {ACTIONS;DENIES/REPORTS:21021675::"Denies"} Difficulty using hands for taps, buttons, cutlery, and/or writing: {ACTIONS;DENIES/REPORTS:21021675::"Denies"}  No Rheumatology ROS completed.   PMFS History:  Patient Active Problem List   Diagnosis Date Noted   Positive colorectal cancer screening using DNA-based stool test 10/23/2022   Dysphagia 01/02/2021   OSA (obstructive sleep apnea) 10/30/2018   Medication overuse headache 10/30/2018   Idiopathic chronic gout of left knee without tophus 03/22/2017   Tobacco use 03/22/2017   History of depression/ with past suicide attempt 03/22/2017   History of IBS 03/22/2017   History of diabetes mellitus 03/22/2017   History of diabetic neuropathy 03/22/2017   Hair thinning 03/21/2017   History of kidney stones 03/21/2017   History of asthma 03/21/2017   History of migraine 03/21/2017   Chronic migraine w/o aura w/o status migrainosus, not intractable 03/08/2016   Alteration consciousness 11/08/2015   Migraine 11/08/2015   CELLULITIS/ABSCESS, ARM 07/15/2008   HYPERTHYROIDISM 06/14/2008   MORBID OBESITY 06/14/2008   ANXIETY 06/14/2008   MIGRAINE, CLASSICAL 06/14/2008   ASTHMA 06/14/2008   MENOPAUSE, SURGICAL 06/14/2008   LYMPHADENOPATHY 06/14/2008   Hx of  cholecystectomy 06/14/2008    Past Medical History:  Diagnosis Date   Anxiety    Arthritis    Asthma    Cellulitis 08/2018   Right leg, hospitalized for a week    Depression    Diabetes mellitus without complication (HCC)    Gastric polyp    Gastritis    GERD (gastroesophageal reflux disease)    Hyperlipemia    Hypertension    Hypothyroidism    Neuromuscular disorder (HCC)    Obesity    Renal disorder    Sepsis (HCC) 08/2018   hospitalized in ICU    Sleep apnea     Family History  Problem Relation Age of Onset   Retinal detachment Sister    Neuromuscular disorder Sister        NMO- has tremor    Stomach cancer Maternal Grandmother    Diabetes Maternal Grandmother    Asthma Daughter    Asthma Son    Seizures Neg Hx    Migraines Neg Hx    Esophageal cancer Neg Hx    Colon cancer Neg Hx    Past Surgical History:  Procedure Laterality Date   ADENOIDECTOMY     ANKLE SURGERY     x1   CHOLECYSTECTOMY     COLONOSCOPY  02/11/2002   Small internal hemorrhoids. Otherwise grossly normal colonoscopy to the mid transvere colon. It was stopped due to inadequate sedation   COLONOSCOPY WITH ESOPHAGOGASTRODUODENOSCOPY (EGD) AND ESOPHAGEAL DILATION (ED)  11/08/2022   ESOPHAGOGASTRODUODENOSCOPY  04/08/2013   Healed esophageal erosions. Small hiatal hernia. Mild gastritis.   FOOT SURGERY     x4   OTHER SURGICAL HISTORY     lap for  ovaries x6   PICC LINE INSERTION     TONSILLECTOMY     UPPER GI ENDOSCOPY  2022   URETERAL STENT PLACEMENT     VAGINAL HYSTERECTOMY     Social History   Social History Narrative   Lives at home alone    Right handed   Caffeine use: 1 cup per day (soda/coffee)   Immunization History  Administered Date(s) Administered   PFIZER(Purple Top)SARS-COV-2 Vaccination 07/14/2020, 08/11/2020   Tdap 12/13/2013     Objective: Vital Signs: There were no vitals taken for this visit.   Physical Exam   Musculoskeletal Exam: ***  CDAI Exam: CDAI  Score: -- Patient Global: --; Provider Global: -- Swollen: --; Tender: -- Joint Exam 06/27/2023   No joint exam has been documented for this visit   There is currently no information documented on the homunculus. Go to the Rheumatology activity and complete the homunculus joint exam.  Investigation: No additional findings.  Imaging: No results found.  Recent Labs: Lab Results  Component Value Date   WBC 5.7 06/21/2021   HGB 13.4 06/21/2021   PLT 185 06/21/2021   NA 140 06/21/2021   K 5.2 06/21/2021   CL 108 06/21/2021   CO2 23 06/21/2021   GLUCOSE 169 (H) 06/21/2021   BUN 18 06/21/2021   CREATININE 1.16 (H) 06/21/2021   BILITOT 0.4 06/21/2021   ALKPHOS 107 08/03/2020   AST 19 06/21/2021   ALT 14 06/21/2021   PROT 7.0 06/21/2021   ALBUMIN 4.7 08/03/2020   CALCIUM 9.9 06/21/2021   GFRAA 62 01/18/2021    Speciality Comments: PLQ Eye Exam: 10/05/2021 normal @ [facility].  Procedures:  No procedures performed Allergies: Sulfonamide derivatives, Erythromycin, Ibuprofen, Mushroom extract complex, Toradol [ketorolac tromethamine], Varenicline, and Azithromycin   Assessment / Plan:     Visit Diagnoses: No diagnosis found.  Orders: No orders of the defined types were placed in this encounter.  No orders of the defined types were placed in this encounter.   Face-to-face time spent with patient was *** minutes. Greater than 50% of time was spent in counseling and coordination of care.  Follow-Up Instructions: No follow-ups on file.   Ellen Henri, CMA  Note - This record has been created using Animal nutritionist.  Chart creation errors have been sought, but may not always  have been located. Such creation errors do not reflect on  the standard of medical care.

## 2023-06-27 ENCOUNTER — Encounter: Payer: Self-pay | Admitting: Rheumatology

## 2023-06-27 ENCOUNTER — Ambulatory Visit (INDEPENDENT_AMBULATORY_CARE_PROVIDER_SITE_OTHER): Payer: 59

## 2023-06-27 ENCOUNTER — Ambulatory Visit: Payer: 59

## 2023-06-27 ENCOUNTER — Ambulatory Visit: Payer: 59 | Attending: Rheumatology | Admitting: Rheumatology

## 2023-06-27 VITALS — BP 118/83 | HR 93 | Resp 16 | Ht 68.0 in | Wt 309.2 lb

## 2023-06-27 DIAGNOSIS — M19072 Primary osteoarthritis, left ankle and foot: Secondary | ICD-10-CM | POA: Diagnosis not present

## 2023-06-27 DIAGNOSIS — Z8669 Personal history of other diseases of the nervous system and sense organs: Secondary | ICD-10-CM

## 2023-06-27 DIAGNOSIS — Z8639 Personal history of other endocrine, nutritional and metabolic disease: Secondary | ICD-10-CM

## 2023-06-27 DIAGNOSIS — Z87442 Personal history of urinary calculi: Secondary | ICD-10-CM

## 2023-06-27 DIAGNOSIS — M359 Systemic involvement of connective tissue, unspecified: Secondary | ICD-10-CM | POA: Diagnosis not present

## 2023-06-27 DIAGNOSIS — M1A0620 Idiopathic chronic gout, left knee, without tophus (tophi): Secondary | ICD-10-CM | POA: Diagnosis not present

## 2023-06-27 DIAGNOSIS — T148XXA Other injury of unspecified body region, initial encounter: Secondary | ICD-10-CM

## 2023-06-27 DIAGNOSIS — M19071 Primary osteoarthritis, right ankle and foot: Secondary | ICD-10-CM

## 2023-06-27 DIAGNOSIS — Z79899 Other long term (current) drug therapy: Secondary | ICD-10-CM

## 2023-06-27 DIAGNOSIS — M170 Bilateral primary osteoarthritis of knee: Secondary | ICD-10-CM

## 2023-06-27 DIAGNOSIS — Z8659 Personal history of other mental and behavioral disorders: Secondary | ICD-10-CM

## 2023-06-27 DIAGNOSIS — Z8709 Personal history of other diseases of the respiratory system: Secondary | ICD-10-CM

## 2023-06-27 DIAGNOSIS — M1711 Unilateral primary osteoarthritis, right knee: Secondary | ICD-10-CM

## 2023-06-27 DIAGNOSIS — Z9049 Acquired absence of other specified parts of digestive tract: Secondary | ICD-10-CM

## 2023-06-27 DIAGNOSIS — Z8719 Personal history of other diseases of the digestive system: Secondary | ICD-10-CM

## 2023-06-27 DIAGNOSIS — M5136 Other intervertebral disc degeneration, lumbar region: Secondary | ICD-10-CM

## 2023-06-27 DIAGNOSIS — L089 Local infection of the skin and subcutaneous tissue, unspecified: Secondary | ICD-10-CM

## 2023-06-27 DIAGNOSIS — Z87898 Personal history of other specified conditions: Secondary | ICD-10-CM

## 2023-06-27 LAB — CBC WITH DIFFERENTIAL/PLATELET
Absolute Monocytes: 431 cells/uL (ref 200–950)
Basophils Absolute: 74 cells/uL (ref 0–200)
Basophils Relative: 0.7 %
Eosinophils Absolute: 315 cells/uL (ref 15–500)
Eosinophils Relative: 3 %
HCT: 40.7 % (ref 35.0–45.0)
Hemoglobin: 13.5 g/dL (ref 11.7–15.5)
Lymphs Abs: 2867 cells/uL (ref 850–3900)
MCH: 28.6 pg (ref 27.0–33.0)
MCHC: 33.2 g/dL (ref 32.0–36.0)
MCV: 86.2 fL (ref 80.0–100.0)
MPV: 10.4 fL (ref 7.5–12.5)
Monocytes Relative: 4.1 %
Neutro Abs: 6815 cells/uL (ref 1500–7800)
Neutrophils Relative %: 64.9 %
Platelets: 283 10*3/uL (ref 140–400)
RBC: 4.72 10*6/uL (ref 3.80–5.10)
RDW: 13.8 % (ref 11.0–15.0)
Total Lymphocyte: 27.3 %
WBC: 10.5 10*3/uL (ref 3.8–10.8)

## 2023-06-27 NOTE — Patient Instructions (Signed)
Exercises for Chronic Knee Pain Chronic knee pain is pain that lasts longer than 3 months. For most people with chronic knee pain, exercise and weight loss is an important part of treatment. Your health care provider may want you to focus on: Making the muscles that support your knee stronger. This can take pressure off your knee and reduce pain. Preventing knee stiffness. How far you can move your knee, keeping it there or making it farther. Losing weight (if this applies) to take pressure off your knee, lower your risk for injury, and make it easier for you to exercise. Your provider will help you make an exercise program that fits your needs and physical abilities. Below are simple, low-impact exercises you can do at home. Ask your provider or physical therapist how often you should do your exercise program and how many times to repeat each exercise. General safety tips  Get your provider's approval before doing any exercises. Start slowly and stop any time you feel pain. Do not exercise if your knee pain is flaring up. Warm up first. Stretching a cold muscle can cause an injury. Do 5-10 minutes of easy movement or light stretching before beginning your exercises. Do 5-10 minutes of low-impact activity (like walking or cycling) before starting strengthening exercises. Contact your provider any time you have pain during or after exercising. Exercise can cause discomfort but should not be painful. It is normal to be a little stiff or sore after exercising. Stretching and range-of-motion exercises Front thigh stretch  Stand up straight and support your body by holding on to a chair or resting one hand on a wall. With your legs straight and close together, bend one knee to lift your heel up toward your butt. Using one hand for support, grab your ankle with your free hand. Pull your foot up closer toward your butt to feel the stretch in front of your thigh. Hold the stretch for 30  seconds. Repeat __________ times. Complete this exercise __________ times a day. Back thigh stretch  Sit on the floor with your back straight and your legs out straight in front of you. Place the palms of your hands on the floor and slide them toward your feet as you bend at the hip. Try to touch your nose to your knees and feel the stretch in the back of your thighs. Hold for 30 seconds. Repeat __________ times. Complete this exercise __________ times a day. Calf stretch  Stand facing a wall. Place the palms of your hands flat against the wall, arms extended, and lean slightly against the wall. Get into a lunge position with one leg bent at the knee and the other leg stretched out straight behind you. Keep both feet facing the wall and increase the bend in your knee while keeping the heel of the other leg flat on the ground. You should feel the stretch in your calf. Hold for 30 seconds. Repeat __________ times. Complete this exercise __________ times a day. Strengthening exercises Straight leg lift  Lie on your back with one knee bent and the other leg out straight. Slowly lift the straight leg without bending the knee. Lift until your foot is about 12 inches (30 cm) off the floor. Hold for 3-5 seconds and slowly lower your leg. Repeat __________ times. Complete this exercise __________ times a day. Single leg dip  Stand between two chairs and put both hands on the backs of the chairs for support. Extend one leg out straight with your body   weight resting on the heel of the standing leg. Slowly bend your standing knee to dip your body to the level that is comfortable for you. Hold for 3-5 seconds. Repeat __________ times. Complete this exercise __________ times a day. Hamstring curls  Stand straight, knees close together, facing the back of a chair. Hold on to the back of a chair with both hands. Keep one leg straight. Bend the other knee while bringing the heel up toward the butt  until the knee is bent at a 90-degree angle (right angle). Hold for 3-5 seconds. Repeat __________ times. Complete this exercise __________ times a day. Wall squat  Stand straight with your back, hips, and head against a wall. Step forward one foot at a time with your back still against the wall. Your feet should be 2 feet (61 cm) from the wall at shoulder width. Keeping your back, hips, and head against the wall, slide down the wall to as close to a sitting position as you can get. Hold for 5-10 seconds, then slowly slide back up. Repeat __________ times. Complete this exercise __________ times a day. Step-ups  Stand in front of a sturdy platform or stool that is about 6 inches (15 cm) high. Slowly step up with your left / right foot, keeping your knee in line with your hip and foot. Do not let your knee bend so far that you cannot see your toes. Hold on to a chair for balance, but do not use it for support. Slowly unlock your knee and lower yourself to the starting position. Repeat __________ times. Complete this exercise __________ times a day. Contact a health care provider if: Your exercises cause pain. Your pain is worse after you exercise. Your pain prevents you from doing your exercises. This information is not intended to replace advice given to you by your health care provider. Make sure you discuss any questions you have with your health care provider. Document Revised: 11/20/2022 Document Reviewed: 11/20/2022 Elsevier Patient Education  2024 [employer]  

## 2023-06-27 NOTE — Telephone Encounter (Signed)
Reviewed results with the patient

## 2023-06-28 NOTE — Progress Notes (Signed)
Glucose is elevated and creatinine is higher than before probably due to diabetes.  Please notify patient and forward results to her PCP.  We will notify patient when all the lab results are available.

## 2023-06-30 NOTE — Progress Notes (Signed)
Liver function is mildly elevated most likely due to Crestor.  Patient should avoid use of NSAIDs and Tylenol.  Complements are normal.  Sed rate is mildly elevated.  Urine protein creatinine ratio is normal, CBC is normal, double-stranded DNA negative, SSA negative, SSB antibodies negative, uric acid is in the desirable range.  No change in treatment advised.  Please forward results to her PCP.

## 2023-10-30 ENCOUNTER — Other Ambulatory Visit: Payer: Self-pay | Admitting: Physician Assistant

## 2023-11-15 ENCOUNTER — Telehealth: Payer: Self-pay

## 2023-11-15 NOTE — Telephone Encounter (Signed)
FYI, Patient contacted the office and states she was returning a call about her appointment being cancelled on 11/28/2023. Patient states she is currently having a flare and that all of her joints are hurting. Scheduled patient a visit for 11/19/2023 at 3:10 pm.

## 2023-11-15 NOTE — Telephone Encounter (Signed)
Patient is scheduled for an appointment on Tuesday 11/19/2023. Advised the patient she could be seen on 11/18/2023. Patient chose the appointment for Tuesday.

## 2023-11-15 NOTE — Telephone Encounter (Signed)
Is the patient having a gout flare or flare of autoimmune disease? It will be best to evaluate her in person on Tuesday to determine best course of treatment.

## 2023-11-15 NOTE — Telephone Encounter (Signed)
Would the patient like to come to the office Monday 12/30 or Thursday 11/21/23 for a sooner office visit?

## 2023-11-15 NOTE — Telephone Encounter (Signed)
Contacted the patient and advised If patient wants to have updated autoimmune lab work prior to her appointment to assess for a flare--ok to place orders for CBC with diff, CMP with GFR, urine protein creatinine ratio, ESR, complements, dsDNA, and uric acid otherwise we can check these labs at her appointment.   Patient states she will just get the lab work done at her appointment.

## 2023-11-15 NOTE — Telephone Encounter (Signed)
If patient wants to have updated autoimmune lab work prior to her appointment to assess for a flare--ok to place orders for CBC with diff, CMP with GFR, urine protein creatinine ratio, ESR, complements, dsDNA, and uric acid otherwise we can check these labs at her appointment.

## 2023-11-15 NOTE — Telephone Encounter (Signed)
Patient contacted the office and states she was returning a call about her appointment being cancelled on 11/28/2023. Patient states she is currently having a flare and that all of her joints are hurting. Scheduled patient a visit for 11/19/2023 at 3:10 pm. Patient did not know if there was anything that could be done. Please advise.

## 2023-11-15 NOTE — Telephone Encounter (Signed)
Contacted the patient and inquired Is the patient having a gout flare or flare of autoimmune disease? Patient states she believes she is having an auto immune flare because all of her joints are hurting and she has noticed no red swelling in any specific locations. Advised the patient It will be best to evaluate her in person on Tuesday to determine best course of treatment.

## 2023-11-18 NOTE — Progress Notes (Deleted)
 Office Visit Note  Patient: Lynn Ponce             Date of Birth: Apr 08, 1976           MRN: [id number]             PCP: Norval Kettle, MD Referring: Norval Kettle, MD Visit Date: 11/19/2023 Occupation: @GUAROCC @  Subjective:    History of Present Illness: GENELLE ECONOMOU is a 47 y.o. female with history of undifferentiated connective tissue disease, osteoarthritis, and gout.  Patient is not currently taking any immunosuppressive agents.        Activities of Daily Living:  Patient reports morning stiffness for *** {minute/hour:19697}.   Patient {ACTIONS;DENIES/REPORTS:21021675::Denies} nocturnal pain.  Difficulty dressing/grooming: {ACTIONS;DENIES/REPORTS:21021675::Denies} Difficulty climbing stairs: {ACTIONS;DENIES/REPORTS:21021675::Denies} Difficulty getting out of chair: {ACTIONS;DENIES/REPORTS:21021675::Denies} Difficulty using hands for taps, buttons, cutlery, and/or writing: {ACTIONS;DENIES/REPORTS:21021675::Denies}  No Rheumatology ROS completed.   PMFS History:  Patient Active Problem List   Diagnosis Date Noted  . Positive colorectal cancer screening using DNA-based stool test 10/23/2022  . Dysphagia 01/02/2021  . OSA (obstructive sleep apnea) 10/30/2018  . Medication overuse headache 10/30/2018  . Idiopathic chronic gout of left knee without tophus 03/22/2017  . Tobacco use 03/22/2017  . History of depression/ with past suicide attempt 03/22/2017  . History of IBS 03/22/2017  . History of diabetes mellitus 03/22/2017  . History of diabetic neuropathy 03/22/2017  . Hair thinning 03/21/2017  . History of kidney stones 03/21/2017  . History of asthma 03/21/2017  . History of migraine 03/21/2017  . Chronic migraine w/o aura w/o status migrainosus, not intractable 03/08/2016  . Alteration consciousness 11/08/2015  . Migraine 11/08/2015  . CELLULITIS/ABSCESS, ARM 07/15/2008  . HYPERTHYROIDISM 06/14/2008  . MORBID OBESITY 06/14/2008  . ANXIETY  06/14/2008  . MIGRAINE, CLASSICAL 06/14/2008  . ASTHMA 06/14/2008  . MENOPAUSE, SURGICAL 06/14/2008  . LYMPHADENOPATHY 06/14/2008  . Hx of cholecystectomy 06/14/2008    Past Medical History:  Diagnosis Date  . Anxiety   . Arthritis   . Asthma   . Cellulitis 08/2018   Right leg, hospitalized for a week   . Depression   . Diabetes mellitus without complication (HCC)   . Gastric polyp   . Gastritis   . GERD (gastroesophageal reflux disease)   . Hyperlipemia   . Hypertension   . Hypothyroidism   . Neuromuscular disorder (HCC)   . Obesity   . Renal disorder   . Sepsis (HCC) 08/2018   hospitalized in ICU   . Sleep apnea     Family History  Problem Relation Age of Onset  . Osteoporosis Mother   . Retinal detachment Sister   . Neuromuscular disorder Sister        NMO- has tremor   . Stomach cancer Maternal Grandmother   . Diabetes Maternal Grandmother   . Asthma Daughter   . Asthma Son   . Seizures Neg Hx   . Migraines Neg Hx   . Esophageal cancer Neg Hx   . Colon cancer Neg Hx    Past Surgical History:  Procedure Laterality Date  . ADENOIDECTOMY    . ANKLE SURGERY     x1  . CHOLECYSTECTOMY    . COLONOSCOPY  02/11/2002   Small internal hemorrhoids. Otherwise grossly normal colonoscopy to the mid transvere colon. It was stopped due to inadequate sedation  . COLONOSCOPY WITH ESOPHAGOGASTRODUODENOSCOPY (EGD) AND ESOPHAGEAL DILATION (ED)  11/08/2022  . ESOPHAGOGASTRODUODENOSCOPY  04/08/2013   Healed esophageal erosions. Small hiatal hernia. Mild  gastritis.  SABRA FOOT SURGERY     x4  . OTHER SURGICAL HISTORY     lap for ovaries x6  . PICC LINE INSERTION    . TONSILLECTOMY    . UPPER GI ENDOSCOPY  2022  . URETERAL STENT PLACEMENT    . VAGINAL HYSTERECTOMY     Social History   Social History Narrative   Lives at home alone    Right handed   Caffeine use: 1 cup per day (soda/coffee)   Immunization History  Administered Date(s) Administered  . PFIZER(Purple  Top)SARS-COV-2 Vaccination 07/14/2020, 08/11/2020  . Tdap 12/13/2013     Objective: Vital Signs: There were no vitals taken for this visit.   Physical Exam Vitals and nursing note reviewed.  Constitutional:      Appearance: She is well-developed.  HENT:     Head: Normocephalic and atraumatic.  Eyes:     Conjunctiva/sclera: Conjunctivae normal.  Cardiovascular:     Rate and Rhythm: Normal rate and regular rhythm.     Heart sounds: Normal heart sounds.  Pulmonary:     Effort: Pulmonary effort is normal.     Breath sounds: Normal breath sounds.  Abdominal:     General: Bowel sounds are normal.     Palpations: Abdomen is soft.  Musculoskeletal:     Cervical back: Normal range of motion.  Lymphadenopathy:     Cervical: No cervical adenopathy.  Skin:    General: Skin is warm and dry.     Capillary Refill: Capillary refill takes less than 2 seconds.  Neurological:     Mental Status: She is alert and oriented to person, place, and time.  Psychiatric:        Behavior: Behavior normal.     Musculoskeletal Exam: ***  CDAI Exam: CDAI Score: -- Patient Global: --; Provider Global: -- Swollen: --; Tender: -- Joint Exam 11/19/2023   No joint exam has been documented for this visit   There is currently no information documented on the homunculus. Go to the Rheumatology activity and complete the homunculus joint exam.  Investigation: No additional findings.  Imaging: No results found.  Recent Labs: Lab Results  Component Value Date   WBC 10.5 06/27/2023   HGB 13.5 06/27/2023   PLT 283 06/27/2023   NA 138 06/27/2023   K 4.4 06/27/2023   CL 104 06/27/2023   CO2 22 06/27/2023   GLUCOSE 225 (H) 06/27/2023   BUN 19 06/27/2023   CREATININE 1.35 (H) 06/27/2023   BILITOT 0.3 06/27/2023   ALKPHOS 107 08/03/2020   AST 42 (H) 06/27/2023   ALT 28 06/27/2023   PROT 7.6 06/27/2023   ALBUMIN 4.7 08/03/2020   CALCIUM 9.7 06/27/2023   GFRAA 62 01/18/2021    Speciality  Comments: PLQ Eye Exam: 10/05/2021 normal @ [facility].  Procedures:  No procedures performed Allergies: Sulfonamide derivatives, Erythromycin, Ibuprofen, Mushroom extract complex (do not select), Toradol  [ketorolac  tromethamine ], Varenicline, and Azithromycin   Assessment / Plan:     Visit Diagnoses: Undifferentiated connective tissue disease (HCC)  High risk medication use  Idiopathic chronic gout of left knee without tophus  Primary osteoarthritis of both knees  Primary osteoarthritis of both feet  Degeneration of intervertebral disc of lumbar region without discogenic back pain or lower extremity pain  History of kidney stones  History of asthma  History of anxiety  History of diabetes mellitus  History of lymphadenopathy  History of diabetic neuropathy  History of migraine  History of IBS  History  of depression  History of hyperlipidemia  History of cholecystectomy  Orders: No orders of the defined types were placed in this encounter.  No orders of the defined types were placed in this encounter.   Face-to-face time spent with patient was *** minutes. Greater than 50% of time was spent in counseling and coordination of care.  Follow-Up Instructions: No follow-ups on file.   Waddell CHRISTELLA Craze, PA-C  Note - This record has been created using Dragon software.  Chart creation errors have been sought, but may not always  have been located. Such creation errors do not reflect on  the standard of medical care.

## 2023-11-19 ENCOUNTER — Ambulatory Visit: Payer: 59 | Admitting: Physician Assistant

## 2023-11-28 ENCOUNTER — Ambulatory Visit: Payer: 59 | Admitting: Physician Assistant

## 2023-11-28 ENCOUNTER — Telehealth: Payer: 59 | Admitting: Physician Assistant

## 2023-11-28 DIAGNOSIS — U071 COVID-19: Secondary | ICD-10-CM | POA: Diagnosis not present

## 2023-11-28 MED ORDER — MOLNUPIRAVIR EUA 200MG CAPSULE: 4.0000 | ORAL_CAPSULE | Freq: Two times a day (BID) | ORAL | 0 refills | Status: AC

## 2023-11-28 MED ORDER — BENZONATATE 100 MG PO CAPS: 100.0000 mg | ORAL_CAPSULE | Freq: Three times a day (TID) | ORAL | 0 refills | Status: DC | PRN

## 2023-11-28 NOTE — Progress Notes (Signed)
 Virtual Visit Consent   Lynn Ponce, you are scheduled for a virtual visit with a [facility] provider today. Just as with appointments in the office, your consent must be obtained to participate. Your consent will be active for this visit and any virtual visit you may have with one of our providers in the next 365 days. If you have a MyChart account, a copy of this consent can be sent to you electronically.  As this is a virtual visit, video technology does not allow for your provider to perform a traditional examination. This may limit your provider's ability to fully assess your condition. If your provider identifies any concerns that need to be evaluated in person or the need to arrange testing (such as labs, EKG, etc.), we will make arrangements to do so. Although advances in technology are sophisticated, we cannot ensure that it will always work on either your end or our end. If the connection with a video visit is poor, the visit may have to be switched to a telephone visit. With either a video or telephone visit, we are not always able to ensure that we have a secure connection.  By engaging in this virtual visit, you consent to the provision of healthcare and authorize for your insurance to be billed (if applicable) for the services provided during this visit. Depending on your insurance coverage, you may receive a charge related to this service.  I need to obtain your verbal consent now. Are you willing to proceed with your visit today? Lynn Ponce has provided verbal consent on 11/28/2023 for a virtual visit (video or telephone). Lynn Ponce, [place]  Date: 11/28/2023 3:04 PM  Virtual Visit via Video Note   I, Lynn Ponce, connected with  Lynn Ponce  ([id number], [birth year]) on 11/28/23 at  3:00 PM EST by a video-enabled telemedicine application and verified that I am speaking with the correct person using two identifiers.  Location: Patient: Virtual Visit Location  Patient: Home Provider: Virtual Visit Location Provider: Home Office   I discussed the limitations of evaluation and management by telemedicine and the availability of in person appointments. The patient expressed understanding and agreed to proceed.    History of Present Illness: Lynn Ponce is a 48 y.o. who identifies as a female who was assigned female at birth, and is being seen today for COVID-19. Notes testing positive today on a home COVID/flu test. Endorses symptoms starting last night with headache, sore throat, nasal congestion, aches, low grade fever. Denies shortness of breath or chest pain. Father is also positive for COVID now.  OTC -- Tylenol .  HPI: HPI  Problems:  Patient Active Problem List   Diagnosis Date Noted   Positive colorectal cancer screening using DNA-based stool test 10/23/2022   Dysphagia 01/02/2021   OSA (obstructive sleep apnea) 10/30/2018   Medication overuse headache 10/30/2018   Idiopathic chronic gout of left knee without tophus 03/22/2017   Tobacco use 03/22/2017   History of depression/ with past suicide attempt 03/22/2017   History of IBS 03/22/2017   History of diabetes mellitus 03/22/2017   History of diabetic neuropathy 03/22/2017   Hair thinning 03/21/2017   History of kidney stones 03/21/2017   History of asthma 03/21/2017   History of migraine 03/21/2017   Chronic migraine w/o aura w/o status migrainosus, not intractable 03/08/2016   Alteration consciousness 11/08/2015   Migraine 11/08/2015   CELLULITIS/ABSCESS, ARM 07/15/2008   HYPERTHYROIDISM 06/14/2008   MORBID OBESITY 06/14/2008  ANXIETY 06/14/2008   MIGRAINE, CLASSICAL 06/14/2008   ASTHMA 06/14/2008   MENOPAUSE, SURGICAL 06/14/2008   LYMPHADENOPATHY 06/14/2008   Hx of cholecystectomy 06/14/2008    Allergies:  Allergies  Allergen Reactions   Sulfonamide Derivatives Anaphylaxis   Erythromycin Hives   Ibuprofen Other (See Comments)    Dark, tarry emesis and stool. Per pt  vomiting blood   Mushroom Extract Complex (Do Not Select) Hives   Toradol  [Ketorolac  Tromethamine ] Other (See Comments)    Dark tarry emesis and stool Per pt vomiting blood   Varenicline     Other reaction(s): Mental Status Changes (intolerance) Suicidal ideation   Azithromycin Rash   Medications:  Current Outpatient Medications:    benzonatate  (TESSALON ) 100 MG capsule, Take 1 capsule (100 mg total) by mouth 3 (three) times daily as needed for cough., Disp: 30 capsule, Rfl: 0   molnupiravir  EUA (LAGEVRIO ) 200 mg CAPS capsule, Take 4 capsules (800 mg total) by mouth 2 (two) times daily for 5 days., Disp: 40 capsule, Rfl: 0   allopurinol  (ZYLOPRIM ) 300 MG tablet, TAKE 1 TABLET BY MOUTH  DAILY, Disp: 90 tablet, Rfl: 0   amitriptyline (ELAVIL) 50 MG tablet, Take 50 mg by mouth daily. , Disp: , Rfl:    atenolol (TENORMIN) 100 MG tablet, Take 100 mg by mouth daily. , Disp: , Rfl:    budesonide (PULMICORT) 0.5 MG/2ML nebulizer solution, Take 0.5 mg by nebulization daily. (Patient not taking: Reported on 06/27/2023), Disp: , Rfl:    Colchicine  0.6 MG CAPS, TAKE 1 CAPSULE BY MOUTH DAILY, MAY TAKE 1 CAPSULE BY MOUTH TWICE DAILY DURING FLARE., Disp: 90 capsule, Rfl: 0   Continuous Glucose Receiver (DEXCOM G7 RECEIVER) DEVI, 1 each by miscellaneous route daily., Disp: , Rfl:    Continuous Glucose Sensor (DEXCOM G7 SENSOR) MISC, Use 1 every 10 days. Use as directed, Disp: , Rfl:    Continuous Glucose Sensor (DEXCOM G7 SENSOR) MISC, USE 1 EVERY 10 DAYS. USE AS DIRECTED, Disp: , Rfl:    dicyclomine (BENTYL) 10 MG capsule, Take 10 mg by mouth 4 (four) times daily -  before meals and at bedtime., Disp: , Rfl:    fluticasone (FLONASE) 50 MCG/ACT nasal spray, daily., Disp: , Rfl:    gabapentin  (NEURONTIN ) 600 MG tablet, TAKE ONE TABLET BY MOUTH 4 TIMES DAILY, Disp: 120 tablet, Rfl: 2   HUMALOG MIX 75/25 (75-25) 100 UNIT/ML SUSP injection, Inject 30-35 Units into the skin 2 (two) times daily. (Patient not  taking: Reported on 06/27/2023), Disp: , Rfl:    hydrOXYzine (VISTARIL) 50 MG capsule, as needed. , Disp: , Rfl:    LANTUS SOLOSTAR 100 UNIT/ML Solostar Pen, Take 40 units twice daily, Disp: , Rfl:    LATUDA 40 MG TABS tablet, Take 40 mg by mouth daily with breakfast. , Disp: , Rfl:    levothyroxine (SYNTHROID, LEVOTHROID) 300 MCG tablet, Take 250 mcg by mouth daily before breakfast., Disp: , Rfl:    magnesium 30 MG tablet, Take 30 mg by mouth 2 (two) times daily., Disp: , Rfl:    metFORMIN (GLUCOPHAGE) 1000 MG tablet, Take 1,000 mg by mouth daily with breakfast., Disp: , Rfl:    montelukast (SINGULAIR) 10 MG tablet, Take 10 mg by mouth at bedtime., Disp: , Rfl:    MOUNJARO 5 MG/0.5ML Pen, SMARTSIG:5 Milligram(s) SUB-Q Once a Week, Disp: , Rfl:    Multiple Vitamins-Minerals (CENTRUM WOMEN PO), Take 1 tablet by mouth daily., Disp: , Rfl:    omeprazole  (PRILOSEC) 40 MG  capsule, TAKE 1 CAPSULE IN THE MORNING AND 1 CAPSULE AT BEDTIME, Disp: 180 capsule, Rfl: 1   ONE TOUCH ULTRA TEST test strip, , Disp: , Rfl:    ONETOUCH DELICA LANCETS 33G MISC, , Disp: , Rfl:    oxyCODONE -acetaminophen  (PERCOCET) 10-325 MG tablet, Take 1 tablet by mouth 4 (four) times daily. , Disp: , Rfl:    PARoxetine (PAXIL) 20 MG tablet, Take 40 mg by mouth daily., Disp: , Rfl:    promethazine  (PHENERGAN ) 25 MG tablet, TAKE 1 TABLET BY MOUTH EVERY 6 HOURS AS NEEDED FOR NAUSEA AND VOMITING, Disp: 15 tablet, Rfl: 0   rizatriptan  (MAXALT ) 10 MG tablet, TAKE 1 TABLET BY MOUTH AS NEEDED FOR MIGRAINE. MAY REPEAT IN 2 HOURS. MAX OF 2 TABS IN 24 HOURS, Disp: 10 tablet, Rfl: 12   rosuvastatin (CRESTOR) 10 MG tablet, Take 10 mg by mouth daily., Disp: , Rfl:    topiramate  (TOPAMAX ) 50 MG tablet, TAKE 4 TABLETS BY MOUTH ONCE DAILY IN THE EVENING. Have primary care refill going forward., Disp: 120 tablet, Rfl: 0   triamcinolone  cream (KENALOG ) 0.1 %, SMARTSIG:1 Application Topical 2-3 Times Daily, Disp: , Rfl:    VENTOLIN HFA 108 (90 BASE)  MCG/ACT inhaler, Inhale 2 puffs into the lungs every 6 (six) hours as needed for shortness of breath. , Disp: , Rfl:   Observations/Objective: Patient is well-developed, well-nourished in no acute distress.  Resting comfortably at home.  Head is normocephalic, atraumatic.  No labored breathing. Speech is clear and coherent with logical content.  Patient is alert and oriented at baseline.   Assessment and Plan: 1. COVID-19 (Primary) - benzonatate  (TESSALON ) 100 MG capsule; Take 1 capsule (100 mg total) by mouth 3 (three) times daily as needed for cough.  Dispense: 30 capsule; Refill: 0 - molnupiravir  EUA (LAGEVRIO ) 200 mg CAPS capsule; Take 4 capsules (800 mg total) by mouth 2 (two) times daily for 5 days.  Dispense: 40 capsule; Refill: 0  Patient with multiple risk factors for complicated course of illness. Discussed risks/benefits of antiviral medications including most common potential ADRs. Patient voiced understanding and would like to proceed with antiviral medication. They are candidate for molnupiravir . Rx sent to pharmacy. Supportive measures, OTC medications and vitamin regimen reviewed. Tessalon  per orders. Quarantine reviewed in detail. Strict ER precautions discussed with patient.    Follow Up Instructions: I discussed the assessment and treatment plan with the patient. The patient was provided an opportunity to ask questions and all were answered. The patient agreed with the plan and demonstrated an understanding of the instructions.  A copy of instructions were sent to the patient via MyChart unless otherwise noted below.   The patient was advised to call back or seek an in-person evaluation if the symptoms worsen or if the condition fails to improve as anticipated.    Lynn Velma Lunger, PA-C

## 2023-11-28 NOTE — Patient Instructions (Signed)
 Lynn Ponce, thank you for joining Lynn Velma Lunger, PA-C for today's virtual visit.  While this provider is not your primary care provider (PCP), if your PCP is located in our provider database this encounter information will be shared with them immediately following your visit.   A [facility] MyChart account gives you access to today's visit and all your visits, tests, and labs performed at [facility]  click here if you don't have a [facility] MyChart account or go to [email or web address]  Consent: (Patient) Lynn Ponce provided verbal consent for this virtual visit at the beginning of the encounter.  Current Medications:  Current Outpatient Medications:    benzonatate  (TESSALON ) 100 MG capsule, Take 1 capsule (100 mg total) by mouth 3 (three) times daily as needed for cough., Disp: 30 capsule, Rfl: 0   molnupiravir  EUA (LAGEVRIO ) 200 mg CAPS capsule, Take 4 capsules (800 mg total) by mouth 2 (two) times daily for 5 days., Disp: 40 capsule, Rfl: 0   allopurinol  (ZYLOPRIM ) 300 MG tablet, TAKE 1 TABLET BY MOUTH  DAILY, Disp: 90 tablet, Rfl: 0   amitriptyline (ELAVIL) 50 MG tablet, Take 50 mg by mouth daily. , Disp: , Rfl:    atenolol (TENORMIN) 100 MG tablet, Take 100 mg by mouth daily. , Disp: , Rfl:    budesonide (PULMICORT) 0.5 MG/2ML nebulizer solution, Take 0.5 mg by nebulization daily. (Patient not taking: Reported on 06/27/2023), Disp: , Rfl:    Colchicine  0.6 MG CAPS, TAKE 1 CAPSULE BY MOUTH DAILY, MAY TAKE 1 CAPSULE BY MOUTH TWICE DAILY DURING FLARE., Disp: 90 capsule, Rfl: 0   Continuous Glucose Receiver (DEXCOM G7 RECEIVER) DEVI, 1 each by miscellaneous route daily., Disp: , Rfl:    Continuous Glucose Sensor (DEXCOM G7 SENSOR) MISC, Use 1 every 10 days. Use as directed, Disp: , Rfl:    Continuous Glucose Sensor (DEXCOM G7 SENSOR) MISC, USE 1 EVERY 10 DAYS. USE AS DIRECTED, Disp: , Rfl:    dicyclomine (BENTYL) 10 MG capsule, Take 10 mg by mouth 4 (four)  times daily -  before meals and at bedtime., Disp: , Rfl:    fluticasone (FLONASE) 50 MCG/ACT nasal spray, daily., Disp: , Rfl:    gabapentin  (NEURONTIN ) 600 MG tablet, TAKE ONE TABLET BY MOUTH 4 TIMES DAILY, Disp: 120 tablet, Rfl: 2   HUMALOG MIX 75/25 (75-25) 100 UNIT/ML SUSP injection, Inject 30-35 Units into the skin 2 (two) times daily. (Patient not taking: Reported on 06/27/2023), Disp: , Rfl:    hydrOXYzine (VISTARIL) 50 MG capsule, as needed. , Disp: , Rfl:    LANTUS SOLOSTAR 100 UNIT/ML Solostar Pen, Take 40 units twice daily, Disp: , Rfl:    LATUDA 40 MG TABS tablet, Take 40 mg by mouth daily with breakfast. , Disp: , Rfl:    levothyroxine (SYNTHROID, LEVOTHROID) 300 MCG tablet, Take 250 mcg by mouth daily before breakfast., Disp: , Rfl:    magnesium 30 MG tablet, Take 30 mg by mouth 2 (two) times daily., Disp: , Rfl:    metFORMIN (GLUCOPHAGE) 1000 MG tablet, Take 1,000 mg by mouth daily with breakfast., Disp: , Rfl:    montelukast (SINGULAIR) 10 MG tablet, Take 10 mg by mouth at bedtime., Disp: , Rfl:    MOUNJARO 5 MG/0.5ML Pen, SMARTSIG:5 Milligram(s) SUB-Q Once a Week, Disp: , Rfl:    Multiple Vitamins-Minerals (CENTRUM WOMEN PO), Take 1 tablet by mouth daily., Disp: , Rfl:    omeprazole  (PRILOSEC) 40 MG capsule, TAKE 1 CAPSULE IN  THE MORNING AND 1 CAPSULE AT BEDTIME, Disp: 180 capsule, Rfl: 1   ONE TOUCH ULTRA TEST test strip, , Disp: , Rfl:    ONETOUCH DELICA LANCETS 33G MISC, , Disp: , Rfl:    oxyCODONE -acetaminophen  (PERCOCET) 10-325 MG tablet, Take 1 tablet by mouth 4 (four) times daily. , Disp: , Rfl:    PARoxetine (PAXIL) 20 MG tablet, Take 40 mg by mouth daily., Disp: , Rfl:    promethazine  (PHENERGAN ) 25 MG tablet, TAKE 1 TABLET BY MOUTH EVERY 6 HOURS AS NEEDED FOR NAUSEA AND VOMITING, Disp: 15 tablet, Rfl: 0   rizatriptan  (MAXALT ) 10 MG tablet, TAKE 1 TABLET BY MOUTH AS NEEDED FOR MIGRAINE. MAY REPEAT IN 2 HOURS. MAX OF 2 TABS IN 24 HOURS, Disp: 10 tablet, Rfl: 12    rosuvastatin (CRESTOR) 10 MG tablet, Take 10 mg by mouth daily., Disp: , Rfl:    topiramate  (TOPAMAX ) 50 MG tablet, TAKE 4 TABLETS BY MOUTH ONCE DAILY IN THE EVENING. Have primary care refill going forward., Disp: 120 tablet, Rfl: 0   triamcinolone  cream (KENALOG ) 0.1 %, SMARTSIG:1 Application Topical 2-3 Times Daily, Disp: , Rfl:    VENTOLIN HFA 108 (90 BASE) MCG/ACT inhaler, Inhale 2 puffs into the lungs every 6 (six) hours as needed for shortness of breath. , Disp: , Rfl:    Medications ordered in this encounter:  Meds ordered this encounter  Medications   benzonatate  (TESSALON ) 100 MG capsule    Sig: Take 1 capsule (100 mg total) by mouth 3 (three) times daily as needed for cough.    Dispense:  30 capsule    Refill:  0    Supervising Provider:   BLAISE ALEENE KIDD [[id number]]   molnupiravir  EUA (LAGEVRIO ) 200 mg CAPS capsule    Sig: Take 4 capsules (800 mg total) by mouth 2 (two) times daily for 5 days.    Dispense:  40 capsule    Refill:  0    Supervising Provider:   BLAISE ALEENE KIDD [[id number]]     *If you need refills on other medications prior to your next appointment, please contact your pharmacy*  Follow-Up: Call back or seek an in-person evaluation if the symptoms worsen or if the condition fails to improve as anticipated.  [facility] [phone]  Care Instructions: Please keep well-hydrated and get plenty of rest. Start a saline nasal rinse to flush out your nasal passages. You can use plain Mucinex to help thin congestion. If you have a humidifier, running in the bedroom at night. I want you to start OTC vitamin D3 1000 units daily, vitamin C 1000 mg daily, and a zinc supplement. Please take prescribed medications as directed.   Isolation Instructions: You are to isolate at home until you have been fever free for at least 24 hours without a fever-reducing medication, and symptoms have been steadily improving for 24 hours. At that time,  you can end  isolation but need to mask for an additional 5 days.   If you must be around other household members who do not have symptoms, you need to make sure that both you and the family members are masking consistently with a high-quality mask.  If you note any worsening of symptoms despite treatment, please seek an in-person evaluation ASAP. If you note any significant shortness of breath or any chest pain, please seek ER evaluation. Please do not delay care!   COVID-19: What to Do if You Are Sick If you test positive and are  an older adult or someone who is at high risk of getting very sick from COVID-19, treatment may be available. Contact a healthcare provider right away after a positive test to determine if you are eligible, even if your symptoms are mild right now. You can also visit a Test to Treat location and, if eligible, receive a prescription from a provider. Don't delay: Treatment must be started within the first few days to be effective. If you have a fever, cough, or other symptoms, you might have COVID-19. Most people have mild illness and are able to recover at home. If you are sick: Keep track of your symptoms. If you have an emergency warning sign (including trouble breathing), call 911. Steps to help prevent the spread of COVID-19 if you are sick If you are sick with COVID-19 or think you might have COVID-19, follow the steps below to care for yourself and to help protect other people in your home and community. Stay home except to get medical care Stay home. Most people with COVID-19 have mild illness and can recover at home without medical care. Do not leave your home, except to get medical care. Do not visit public areas and do not go to places where you are unable to wear a mask. Take care of yourself. Get rest and stay hydrated. Take over-the-counter medicines, such as acetaminophen , to help you feel better. Stay in touch with your doctor. Call before you get medical care. Be sure  to get care if you have trouble breathing, or have any other emergency warning signs, or if you think it is an emergency. Avoid public transportation, ride-sharing, or taxis if possible. Get tested If you have symptoms of COVID-19, get tested. While waiting for test results, stay away from others, including staying apart from those living in your household. Get tested as soon as possible after your symptoms start. Treatments may be available for people with COVID-19 who are at risk for becoming very sick. Don't delay: Treatment must be started early to be effective--some treatments must begin within 5 days of your first symptoms. Contact your healthcare provider right away if your test result is positive to determine if you are eligible. Self-tests are one of several options for testing for the virus that causes COVID-19 and may be more convenient than laboratory-based tests and point-of-care tests. Ask your healthcare provider or your local health department if you need help interpreting your test results. You can visit your state, tribal, local, and territorial health department's website to look for the latest local information on testing sites. Separate yourself from other people As much as possible, stay in a specific room and away from other people and pets in your home. If possible, you should use a separate bathroom. If you need to be around other people or animals in or outside of the home, wear a well-fitting mask. Tell your close contacts that they may have been exposed to COVID-19. An infected person can spread COVID-19 starting 48 hours (or 2 days) before the person has any symptoms or tests positive. By letting your close contacts know they may have been exposed to COVID-19, you are helping to protect everyone. See COVID-19 and Animals if you have questions about pets. If you are diagnosed with COVID-19, someone from the health department may call you. Answer the call to slow the  spread. Monitor your symptoms Symptoms of COVID-19 include fever, cough, or other symptoms. Follow care instructions from your healthcare provider and local health department. Your  local health authorities may give instructions on checking your symptoms and reporting information. When to seek emergency medical attention Look for emergency warning signs* for COVID-19. If someone is showing any of these signs, seek emergency medical care immediately: Trouble breathing Persistent pain or pressure in the chest New confusion Inability to wake or stay awake Pale, gray, or blue-colored skin, lips, or nail beds, depending on skin tone *This list is not all possible symptoms. Please call your medical provider for any other symptoms that are severe or concerning to you. Call 911 or call ahead to your local emergency facility: Notify the operator that you are seeking care for someone who has or may have COVID-19. Call ahead before visiting your doctor Call ahead. Many medical visits for routine care are being postponed or done by phone or telemedicine. If you have a medical appointment that cannot be postponed, call your doctor's office, and tell them you have or may have COVID-19. This will help the office protect themselves and other patients. If you are sick, wear a well-fitting mask You should wear a mask if you must be around other people or animals, including pets (even at home). Wear a mask with the best fit, protection, and comfort for you. You don't need to wear the mask if you are alone. If you can't put on a mask (because of trouble breathing, for example), cover your coughs and sneezes in some other way. Try to stay at least 6 feet away from other people. This will help protect the people around you. Masks should not be placed on young children under age 21 years, anyone who has trouble breathing, or anyone who is not able to remove the mask without help. Cover your coughs and sneezes Cover  your mouth and nose with a tissue when you cough or sneeze. Throw away used tissues in a lined trash can. Immediately wash your hands with soap and water for at least 20 seconds. If soap and water are not available, clean your hands with an alcohol-based hand sanitizer that contains at least 60% alcohol. Clean your hands often Wash your hands often with soap and water for at least 20 seconds. This is especially important after blowing your nose, coughing, or sneezing; going to the bathroom; and before eating or preparing food. Use hand sanitizer if soap and water are not available. Use an alcohol-based hand sanitizer with at least 60% alcohol, covering all surfaces of your hands and rubbing them together until they feel dry. Soap and water are the best option, especially if hands are visibly dirty. Avoid touching your eyes, nose, and mouth with unwashed hands. Handwashing Tips Avoid sharing personal household items Do not share dishes, drinking glasses, cups, eating utensils, towels, or bedding with other people in your home. Wash these items thoroughly after using them with soap and water or put in the dishwasher. Clean surfaces in your home regularly Clean and disinfect high-touch surfaces (for example, doorknobs, tables, handles, light switches, and countertops) in your sick room and bathroom. In shared spaces, you should clean and disinfect surfaces and items after each use by the person who is ill. If you are sick and cannot clean, a caregiver or other person should only clean and disinfect the area around you (such as your bedroom and bathroom) on an as needed basis. Your caregiver/other person should wait as long as possible (at least several hours) and wear a mask before entering, cleaning, and disinfecting shared spaces that you use. Clean and disinfect  areas that may have blood, stool, or body fluids on them. Use household cleaners and disinfectants. Clean visible dirty surfaces with  household cleaners containing soap or detergent. Then, use a household disinfectant. Use a product from [employer] List N: Disinfectants for Coronavirus (COVID-19). Be sure to follow the instructions on the label to ensure safe and effective use of the product. Many products recommend keeping the surface wet with a disinfectant for a certain period of time (look at contact time on the product label). You may also need to wear personal protective equipment, such as gloves, depending on the directions on the product label. Immediately after disinfecting, wash your hands with soap and water for 20 seconds. For completed guidance on cleaning and disinfecting your home, visit Complete Disinfection Guidance. Take steps to improve ventilation at home Improve ventilation (air flow) at home to help prevent from spreading COVID-19 to other people in your household. Clear out COVID-19 virus particles in the air by opening windows, using air filters, and turning on fans in your home. Use this interactive tool to learn how to improve air flow in your home. When you can be around others after being sick with COVID-19 Deciding when you can be around others is different for different situations. Find out when you can safely end home isolation. For any additional questions about your care, contact your healthcare provider or state or local health department. 02/07/2021 Content source: [facility] ([facility]), Division of Viral Diseases This information is not intended to replace advice given to you by your health care provider. Make sure you discuss any questions you have with your health care provider. Document Revised: 03/23/2021 Document Reviewed: 03/23/2021 Elsevier Patient Education  2022 [employer].   If you have been instructed to have an in-person evaluation today at a local Urgent Care facility, please use the link below. It will take you to a list of all of our  available [facility], including address, phone number and hours of operation. Please do not delay care.  [facility]  If you or a family member do not have a primary care provider, use the link below to schedule a visit and establish care. When you choose a [facility] primary care physician or advanced practice provider, you gain a long-term partner in health. Find a Primary Care Provider  Learn more about [facility]'s in-office and virtual care options:  - Get Care Now

## 2023-12-31 ENCOUNTER — Telehealth: Payer: 59 | Admitting: Physician Assistant

## 2023-12-31 DIAGNOSIS — J019 Acute sinusitis, unspecified: Secondary | ICD-10-CM | POA: Diagnosis not present

## 2023-12-31 DIAGNOSIS — B9689 Other specified bacterial agents as the cause of diseases classified elsewhere: Secondary | ICD-10-CM

## 2023-12-31 MED ORDER — PROMETHAZINE-DM 6.25-15 MG/5ML PO SYRP: 5.0000 mL | ORAL_SOLUTION | Freq: Four times a day (QID) | ORAL | 0 refills | Status: AC | PRN

## 2023-12-31 MED ORDER — DOXYCYCLINE HYCLATE 100 MG PO TABS: 100.0000 mg | ORAL_TABLET | Freq: Two times a day (BID) | ORAL | 0 refills | Status: DC

## 2023-12-31 NOTE — Progress Notes (Signed)
Virtual Visit Consent   [name], you are scheduled for a virtual visit with a [facility] provider today. Just as with appointments in the office, your consent must be obtained to participate. Your consent will be active for this visit and any virtual visit you may have with one of our providers in the next 365 days. If you have a MyChart account, a copy of this consent can be sent to you electronically.  As this is a virtual visit, video technology does not allow for your provider to perform a traditional examination. This may limit your provider's ability to fully assess your condition. If your provider identifies any concerns that need to be evaluated in person or the need to arrange testing (such as labs, EKG, etc.), we will make arrangements to do so. Although advances in technology are sophisticated, we cannot ensure that it will always work on either your end or our end. If the connection with a video visit is poor, the visit may have to be switched to a telephone visit. With either a video or telephone visit, we are not always able to ensure that we have a secure connection.  By engaging in this virtual visit, you consent to the provision of healthcare and authorize for your insurance to be billed (if applicable) for the services provided during this visit. Depending on your insurance coverage, you may receive a charge related to this service.  I need to obtain your verbal consent now. Are you willing to proceed with your visit today? Lynn Ponce has provided verbal consent on 12/31/2023 for a virtual visit (video or telephone). Lynn Ponce, [place]  Date: 12/31/2023 3:53 PM   Virtual Visit via Video Note   I, Lynn Ponce, connected with  Lynn Ponce  ([id number], May 06, 1976) on 12/31/23 at  4:00 PM EST by a video-enabled telemedicine application and verified that I am speaking with the correct person using two identifiers.  Location: Patient: Virtual Visit Location  Patient: Home Provider: Virtual Visit Location Provider: Home Office   I discussed the limitations of evaluation and management by telemedicine and the availability of in person appointments. The patient expressed understanding and agreed to proceed.    History of Present Illness: Lynn Ponce is a 48 y.o. who identifies as a female who was assigned female at birth, and is being seen today for nasal congestion, sinus pressure, cough and headache with thick nasal discharge over the past weeks, worsening over past several days.  Notes maxillary sinuses are full but without pain but some frontal sinus pain and bilateral ear pressure.  Denies fever. Treated last month for COVID-19. COVID/Flu testing at home today was negative. Has had her flu shot.   HPI: HPI  Problems:  Patient Active Problem List   Diagnosis Date Noted   Positive colorectal cancer screening using DNA-based stool test 10/23/2022   Dysphagia 01/02/2021   OSA (obstructive sleep apnea) 10/30/2018   Medication overuse headache 10/30/2018   Idiopathic chronic gout of left knee without tophus 03/22/2017   Tobacco use 03/22/2017   History of depression/ with past suicide attempt 03/22/2017   History of IBS 03/22/2017   History of diabetes mellitus 03/22/2017   History of diabetic neuropathy 03/22/2017   Hair thinning 03/21/2017   History of kidney stones 03/21/2017   History of asthma 03/21/2017   History of migraine 03/21/2017   Chronic migraine w/o aura w/o status migrainosus, not intractable 03/08/2016   Alteration consciousness 11/08/2015  Migraine 11/08/2015   CELLULITIS/ABSCESS, ARM 07/15/2008   HYPERTHYROIDISM 06/14/2008   MORBID OBESITY 06/14/2008   ANXIETY 06/14/2008   MIGRAINE, CLASSICAL 06/14/2008   ASTHMA 06/14/2008   MENOPAUSE, SURGICAL 06/14/2008   LYMPHADENOPATHY 06/14/2008   Hx of cholecystectomy 06/14/2008    Allergies:  Allergies  Allergen Reactions   Sulfonamide Derivatives Anaphylaxis    Erythromycin Hives   Ibuprofen Other (See Comments)    Dark, tarry emesis and stool. Per pt "vomiting blood"   Mushroom Extract Complex (Obsolete) Hives   Toradol [Ketorolac Tromethamine] Other (See Comments)    Dark tarry emesis and stool Per pt "vomiting blood"   Varenicline     Other reaction(s): Mental Status Changes (intolerance) Suicidal ideation   Azithromycin Rash   Medications:  Current Outpatient Medications:    doxycycline (VIBRA-TABS) 100 MG tablet, Take 1 tablet (100 mg total) by mouth 2 (two) times daily., Disp: 20 tablet, Rfl: 0   promethazine-dextromethorphan (PROMETHAZINE-DM) 6.25-15 MG/5ML syrup, Take 5 mLs by mouth 4 (four) times daily as needed for cough., Disp: 118 mL, Rfl: 0   allopurinol (ZYLOPRIM) 300 MG tablet, TAKE 1 TABLET BY MOUTH  DAILY, Disp: 90 tablet, Rfl: 0   amitriptyline (ELAVIL) 50 MG tablet, Take 50 mg by mouth daily. , Disp: , Rfl:    atenolol (TENORMIN) 100 MG tablet, Take 100 mg by mouth daily. , Disp: , Rfl:    budesonide (PULMICORT) 0.5 MG/2ML nebulizer solution, Take 0.5 mg by nebulization daily. (Patient not taking: Reported on 06/27/2023), Disp: , Rfl:    Colchicine 0.6 MG CAPS, TAKE 1 CAPSULE BY MOUTH DAILY, MAY TAKE 1 CAPSULE BY MOUTH TWICE DAILY DURING FLARE., Disp: 90 capsule, Rfl: 0   Continuous Glucose Receiver (DEXCOM G7 RECEIVER) DEVI, 1 each by miscellaneous route daily., Disp: , Rfl:    Continuous Glucose Sensor (DEXCOM G7 SENSOR) MISC, Use 1 every 10 days. Use as directed, Disp: , Rfl:    dicyclomine (BENTYL) 10 MG capsule, Take 10 mg by mouth 4 (four) times daily -  before meals and at bedtime., Disp: , Rfl:    fluticasone (FLONASE) 50 MCG/ACT nasal spray, daily., Disp: , Rfl:    gabapentin (NEURONTIN) 600 MG tablet, TAKE ONE TABLET BY MOUTH 4 TIMES DAILY, Disp: 120 tablet, Rfl: 2   hydrOXYzine (VISTARIL) 50 MG capsule, as needed. , Disp: , Rfl:    LANTUS SOLOSTAR 100 UNIT/ML Solostar Pen, Take 40 units twice daily, Disp: , Rfl:     LATUDA 40 MG TABS tablet, Take 40 mg by mouth daily with breakfast. , Disp: , Rfl:    levothyroxine (SYNTHROID, LEVOTHROID) 300 MCG tablet, Take 250 mcg by mouth daily before breakfast., Disp: , Rfl:    magnesium 30 MG tablet, Take 30 mg by mouth 2 (two) times daily., Disp: , Rfl:    metFORMIN (GLUCOPHAGE) 1000 MG tablet, Take 1,000 mg by mouth daily with breakfast., Disp: , Rfl:    montelukast (SINGULAIR) 10 MG tablet, Take 10 mg by mouth at bedtime., Disp: , Rfl:    MOUNJARO 5 MG/0.5ML Pen, SMARTSIG:5 Milligram(s) SUB-Q Once a Week, Disp: , Rfl:    Multiple Vitamins-Minerals (CENTRUM WOMEN PO), Take 1 tablet by mouth daily., Disp: , Rfl:    omeprazole (PRILOSEC) 40 MG capsule, TAKE 1 CAPSULE IN THE MORNING AND 1 CAPSULE AT BEDTIME, Disp: 180 capsule, Rfl: 1   ONE TOUCH ULTRA TEST test strip, , Disp: , Rfl:    ONETOUCH DELICA LANCETS 33G MISC, , Disp: , Rfl:  oxyCODONE-acetaminophen (PERCOCET) 10-325 MG tablet, Take 1 tablet by mouth 4 (four) times daily. , Disp: , Rfl:    PARoxetine (PAXIL) 20 MG tablet, Take 40 mg by mouth daily., Disp: , Rfl:    promethazine (PHENERGAN) 25 MG tablet, TAKE 1 TABLET BY MOUTH EVERY 6 HOURS AS NEEDED FOR NAUSEA AND VOMITING, Disp: 15 tablet, Rfl: 0   rizatriptan (MAXALT) 10 MG tablet, TAKE 1 TABLET BY MOUTH AS NEEDED FOR MIGRAINE. MAY REPEAT IN 2 HOURS. MAX OF 2 TABS IN 24 HOURS, Disp: 10 tablet, Rfl: 12   rosuvastatin (CRESTOR) 10 MG tablet, Take 10 mg by mouth daily., Disp: , Rfl:    topiramate (TOPAMAX) 50 MG tablet, TAKE 4 TABLETS BY MOUTH ONCE DAILY IN THE EVENING. Have primary care refill going forward., Disp: 120 tablet, Rfl: 0   triamcinolone cream (KENALOG) 0.1 %, SMARTSIG:1 Application Topical 2-3 Times Daily, Disp: , Rfl:    VENTOLIN HFA 108 (90 BASE) MCG/ACT inhaler, Inhale 2 puffs into the lungs every 6 (six) hours as needed for shortness of breath. , Disp: , Rfl:   Observations/Objective: Patient is well-developed, well-nourished in no acute  distress.  Resting comfortably at home.  Head is normocephalic, atraumatic.  No labored breathing. Speech is clear and coherent with logical content.  Patient is alert and oriented at baseline.   Assessment and Plan: 1. Acute bacterial sinusitis (Primary) - promethazine-dextromethorphan (PROMETHAZINE-DM) 6.25-15 MG/5ML syrup; Take 5 mLs by mouth 4 (four) times daily as needed for cough.  Dispense: 118 mL; Refill: 0 - doxycycline (VIBRA-TABS) 100 MG tablet; Take 1 tablet (100 mg total) by mouth 2 (two) times daily.  Dispense: 20 tablet; Refill: 0  Rx Doxycycline.  Increase fluids.  Rest.  Saline nasal spray.  Probiotic.  Mucinex as directed.  Humidifier in bedroom. Promethazine-DM for cough.  Call or return to clinic if symptoms are not improving.   Follow Up Instructions: I discussed the assessment and treatment plan with the patient. The patient was provided an opportunity to ask questions and all were answered. The patient agreed with the plan and demonstrated an understanding of the instructions.  A copy of instructions were sent to the patient via MyChart unless otherwise noted below.   The patient was advised to call back or seek an in-person evaluation if the symptoms worsen or if the condition fails to improve as anticipated.    Lynn Climes, PA-C

## 2023-12-31 NOTE — Patient Instructions (Signed)
Lynn Ponce, thank you for joining Piedad Climes, PA-C for today's virtual visit.  While this provider is not your primary care provider (PCP), if your PCP is located in our provider database this encounter information will be shared with them immediately following your visit.   A [facility] MyChart account gives you access to today's visit and all your visits, tests, and labs performed at [facility] " click here if you don't have a [facility] MyChart account or go to [email or web address]  Consent: (Patient) Lynn Ponce provided verbal consent for this virtual visit at the beginning of the encounter.  Current Medications:  Current Outpatient Medications:    allopurinol (ZYLOPRIM) 300 MG tablet, TAKE 1 TABLET BY MOUTH  DAILY, Disp: 90 tablet, Rfl: 0   amitriptyline (ELAVIL) 50 MG tablet, Take 50 mg by mouth daily. , Disp: , Rfl:    atenolol (TENORMIN) 100 MG tablet, Take 100 mg by mouth daily. , Disp: , Rfl:    benzonatate (TESSALON) 100 MG capsule, Take 1 capsule (100 mg total) by mouth 3 (three) times daily as needed for cough., Disp: 30 capsule, Rfl: 0   budesonide (PULMICORT) 0.5 MG/2ML nebulizer solution, Take 0.5 mg by nebulization daily. (Patient not taking: Reported on 06/27/2023), Disp: , Rfl:    Colchicine 0.6 MG CAPS, TAKE 1 CAPSULE BY MOUTH DAILY, MAY TAKE 1 CAPSULE BY MOUTH TWICE DAILY DURING FLARE., Disp: 90 capsule, Rfl: 0   Continuous Glucose Receiver (DEXCOM G7 RECEIVER) DEVI, 1 each by miscellaneous route daily., Disp: , Rfl:    Continuous Glucose Sensor (DEXCOM G7 SENSOR) MISC, Use 1 every 10 days. Use as directed, Disp: , Rfl:    Continuous Glucose Sensor (DEXCOM G7 SENSOR) MISC, USE 1 EVERY 10 DAYS. USE AS DIRECTED, Disp: , Rfl:    dicyclomine (BENTYL) 10 MG capsule, Take 10 mg by mouth 4 (four) times daily -  before meals and at bedtime., Disp: , Rfl:    fluticasone (FLONASE) 50 MCG/ACT nasal spray, daily., Disp: , Rfl:    gabapentin  (NEURONTIN) 600 MG tablet, TAKE ONE TABLET BY MOUTH 4 TIMES DAILY, Disp: 120 tablet, Rfl: 2   HUMALOG MIX 75/25 (75-25) 100 UNIT/ML SUSP injection, Inject 30-35 Units into the skin 2 (two) times daily. (Patient not taking: Reported on 06/27/2023), Disp: , Rfl:    hydrOXYzine (VISTARIL) 50 MG capsule, as needed. , Disp: , Rfl:    LANTUS SOLOSTAR 100 UNIT/ML Solostar Pen, Take 40 units twice daily, Disp: , Rfl:    LATUDA 40 MG TABS tablet, Take 40 mg by mouth daily with breakfast. , Disp: , Rfl:    levothyroxine (SYNTHROID, LEVOTHROID) 300 MCG tablet, Take 250 mcg by mouth daily before breakfast., Disp: , Rfl:    magnesium 30 MG tablet, Take 30 mg by mouth 2 (two) times daily., Disp: , Rfl:    metFORMIN (GLUCOPHAGE) 1000 MG tablet, Take 1,000 mg by mouth daily with breakfast., Disp: , Rfl:    montelukast (SINGULAIR) 10 MG tablet, Take 10 mg by mouth at bedtime., Disp: , Rfl:    MOUNJARO 5 MG/0.5ML Pen, SMARTSIG:5 Milligram(s) SUB-Q Once a Week, Disp: , Rfl:    Multiple Vitamins-Minerals (CENTRUM WOMEN PO), Take 1 tablet by mouth daily., Disp: , Rfl:    omeprazole (PRILOSEC) 40 MG capsule, TAKE 1 CAPSULE IN THE MORNING AND 1 CAPSULE AT BEDTIME, Disp: 180 capsule, Rfl: 1   ONE TOUCH ULTRA TEST test strip, , Disp: , Rfl:    ONETOUCH DELICA  LANCETS 33G MISC, , Disp: , Rfl:    oxyCODONE-acetaminophen (PERCOCET) 10-325 MG tablet, Take 1 tablet by mouth 4 (four) times daily. , Disp: , Rfl:    PARoxetine (PAXIL) 20 MG tablet, Take 40 mg by mouth daily., Disp: , Rfl:    promethazine (PHENERGAN) 25 MG tablet, TAKE 1 TABLET BY MOUTH EVERY 6 HOURS AS NEEDED FOR NAUSEA AND VOMITING, Disp: 15 tablet, Rfl: 0   rizatriptan (MAXALT) 10 MG tablet, TAKE 1 TABLET BY MOUTH AS NEEDED FOR MIGRAINE. MAY REPEAT IN 2 HOURS. MAX OF 2 TABS IN 24 HOURS, Disp: 10 tablet, Rfl: 12   rosuvastatin (CRESTOR) 10 MG tablet, Take 10 mg by mouth daily., Disp: , Rfl:    topiramate (TOPAMAX) 50 MG tablet, TAKE 4 TABLETS BY MOUTH ONCE DAILY  IN THE EVENING. Have primary care refill going forward., Disp: 120 tablet, Rfl: 0   triamcinolone cream (KENALOG) 0.1 %, SMARTSIG:1 Application Topical 2-3 Times Daily, Disp: , Rfl:    VENTOLIN HFA 108 (90 BASE) MCG/ACT inhaler, Inhale 2 puffs into the lungs every 6 (six) hours as needed for shortness of breath. , Disp: , Rfl:    Medications ordered in this encounter:  No orders of the defined types were placed in this encounter.    *If you need refills on other medications prior to your next appointment, please contact your pharmacy*  Follow-Up: Call back or seek an in-person evaluation if the symptoms worsen or if the condition fails to improve as anticipated.  [facility] [phone]  Other Instructions Please take antibiotic as directed.  Increase fluid intake.  Use Saline nasal spray.  Take a daily multivitamin. Take the cough syrup as directed. Continue your regular medications.  Place a humidifier in the bedroom.  Please call or return clinic if symptoms are not improving.  Sinusitis Sinusitis is redness, soreness, and swelling (inflammation) of the paranasal sinuses. Paranasal sinuses are air pockets within the bones of your face (beneath the eyes, the middle of the forehead, or above the eyes). In healthy paranasal sinuses, mucus is able to drain out, and air is able to circulate through them by way of your nose. However, when your paranasal sinuses are inflamed, mucus and air can become trapped. This can allow bacteria and other germs to grow and cause infection. Sinusitis can develop quickly and last only a short time (acute) or continue over a long period (chronic). Sinusitis that lasts for more than 12 weeks is considered chronic.  CAUSES  Causes of sinusitis include: Allergies. Structural abnormalities, such as displacement of the cartilage that separates your nostrils (deviated septum), which can decrease the air flow through your nose and sinuses and affect  sinus drainage. Functional abnormalities, such as when the small hairs (cilia) that line your sinuses and help remove mucus do not work properly or are not present. SYMPTOMS  Symptoms of acute and chronic sinusitis are the same. The primary symptoms are pain and pressure around the affected sinuses. Other symptoms include: Upper toothache. Earache. Headache. Bad breath. Decreased sense of smell and taste. A cough, which worsens when you are lying flat. Fatigue. Fever. Thick drainage from your nose, which often is green and may contain pus (purulent). Swelling and warmth over the affected sinuses. DIAGNOSIS  Your caregiver will perform a physical exam. During the exam, your caregiver may: Look in your nose for signs of abnormal growths in your nostrils (nasal polyps). Tap over the affected sinus to check for signs of infection.  View the inside of your sinuses (endoscopy) with a special imaging device with a light attached (endoscope), which is inserted into your sinuses. If your caregiver suspects that you have chronic sinusitis, one or more of the following tests may be recommended: Allergy tests. Nasal culture A sample of mucus is taken from your nose and sent to a lab and screened for bacteria. Nasal cytology A sample of mucus is taken from your nose and examined by your caregiver to determine if your sinusitis is related to an allergy. TREATMENT  Most cases of acute sinusitis are related to a viral infection and will resolve on their own within 10 days. Sometimes medicines are prescribed to help relieve symptoms (pain medicine, decongestants, nasal steroid sprays, or saline sprays).  However, for sinusitis related to a bacterial infection, your caregiver will prescribe antibiotic medicines. These are medicines that will help kill the bacteria causing the infection.  Rarely, sinusitis is caused by a fungal infection. In theses cases, your caregiver will prescribe antifungal medicine. For  some cases of chronic sinusitis, surgery is needed. Generally, these are cases in which sinusitis recurs more than 3 times per year, despite other treatments. HOME CARE INSTRUCTIONS  Drink plenty of water. Water helps thin the mucus so your sinuses can drain more easily. Use a humidifier. Inhale steam 3 to 4 times a day (for example, sit in the bathroom with the shower running). Apply a warm, moist washcloth to your face 3 to 4 times a day, or as directed by your caregiver. Use saline nasal sprays to help moisten and clean your sinuses. Take over-the-counter or prescription medicines for pain, discomfort, or fever only as directed by your caregiver. SEEK IMMEDIATE MEDICAL CARE IF: You have increasing pain or severe headaches. You have nausea, vomiting, or drowsiness. You have swelling around your face. You have vision problems. You have a stiff neck. You have difficulty breathing. MAKE SURE YOU:  Understand these instructions. Will watch your condition. Will get help right away if you are not doing well or get worse. Document Released: 11/05/2005 Document Revised: 01/28/2012 Document Reviewed: 11/20/2011 [facility] Patient Information 2014 [place], [place].    If you have been instructed to have an in-person evaluation today at a local Urgent Care facility, please use the link below. It will take you to a list of all of our available [facility], including address, phone number and hours of operation. Please do not delay care.  [facility]  If you or a family member do not have a primary care provider, use the link below to schedule a visit and establish care. When you choose a [facility] primary care physician or advanced practice provider, you gain a long-term partner in health. Find a Primary Care Provider  Learn more about [facility]'s in-office and virtual care options: [facility] - Get Care Now

## 2024-01-10 ENCOUNTER — Telehealth: Payer: 59 | Admitting: Family Medicine

## 2024-01-10 DIAGNOSIS — B370 Candidal stomatitis: Secondary | ICD-10-CM | POA: Diagnosis not present

## 2024-01-10 MED ORDER — NYSTATIN 100000 UNIT/ML MT SUSP: 5.0000 mL | Freq: Four times a day (QID) | OROMUCOSAL | 1 refills | Status: AC

## 2024-01-10 NOTE — Patient Instructions (Signed)

## 2024-01-10 NOTE — Progress Notes (Signed)
 Virtual Visit Consent   Lynn Ponce, you are scheduled for a virtual visit with a [facility] provider today. Just as with appointments in the office, your consent must be obtained to participate. Your consent will be active for this visit and any virtual visit you may have with one of our providers in the next 365 days. If you have a MyChart account, a copy of this consent can be sent to you electronically.  As this is a virtual visit, video technology does not allow for your provider to perform a traditional examination. This may limit your provider's ability to fully assess your condition. If your provider identifies any concerns that need to be evaluated in person or the need to arrange testing (such as labs, EKG, etc.), we will make arrangements to do so. Although advances in technology are sophisticated, we cannot ensure that it will always work on either your end or our end. If the connection with a video visit is poor, the visit may have to be switched to a telephone visit. With either a video or telephone visit, we are not always able to ensure that we have a secure connection.  By engaging in this virtual visit, you consent to the provision of healthcare and authorize for your insurance to be billed (if applicable) for the services provided during this visit. Depending on your insurance coverage, you may receive a charge related to this service.  I need to obtain your verbal consent now. Are you willing to proceed with your visit today? Lynn Ponce has provided verbal consent on 01/10/2024 for a virtual visit (video or telephone). Georgana Curio, FNP  Date: 01/10/2024 4:18 PM   Virtual Visit via Video Note   I, Georgana Curio, connected with  Lynn Ponce  ([id number], [birth year]) on 01/10/24 at  4:30 PM EST by a video-enabled telemedicine application and verified that I am speaking with the correct person using two identifiers.  Location: Patient: Virtual Visit Location Patient:  Home Provider: Virtual Visit Location Provider: Home Office   I discussed the limitations of evaluation and management by telemedicine and the availability of in person appointments. The patient expressed understanding and agreed to proceed.    History of Present Illness: Lynn Ponce is a 48 y.o. who identifies as a female who was assigned female at birth, and is being seen today for white patches in mouth and on tongue, also feels like its down her throat since taking doxycycline for a URI. She feels like she has thrush. .  HPI: HPI  Problems:  Patient Active Problem List   Diagnosis Date Noted   Positive colorectal cancer screening using DNA-based stool test 10/23/2022   Dysphagia 01/02/2021   OSA (obstructive sleep apnea) 10/30/2018   Medication overuse headache 10/30/2018   Idiopathic chronic gout of left knee without tophus 03/22/2017   Tobacco use 03/22/2017   History of depression/ with past suicide attempt 03/22/2017   History of IBS 03/22/2017   History of diabetes mellitus 03/22/2017   History of diabetic neuropathy 03/22/2017   Hair thinning 03/21/2017   History of kidney stones 03/21/2017   History of asthma 03/21/2017   History of migraine 03/21/2017   Chronic migraine w/o aura w/o status migrainosus, not intractable 03/08/2016   Alteration consciousness 11/08/2015   Migraine 11/08/2015   CELLULITIS/ABSCESS, ARM 07/15/2008   HYPERTHYROIDISM 06/14/2008   MORBID OBESITY 06/14/2008   ANXIETY 06/14/2008   MIGRAINE, CLASSICAL 06/14/2008   ASTHMA 06/14/2008   MENOPAUSE, SURGICAL 06/14/2008  LYMPHADENOPATHY 06/14/2008   Hx of cholecystectomy 06/14/2008    Allergies:  Allergies  Allergen Reactions   Sulfonamide Derivatives Anaphylaxis   Erythromycin Hives   Ibuprofen Other (See Comments)    Dark, tarry emesis and stool. Per pt "vomiting blood"   Mushroom Extract Complex (Obsolete) Hives   Toradol [Ketorolac Tromethamine] Other (See Comments)    Dark tarry  emesis and stool Per pt "vomiting blood"   Varenicline     Other reaction(s): Mental Status Changes (intolerance) Suicidal ideation   Azithromycin Rash   Medications:  Current Outpatient Medications:    allopurinol (ZYLOPRIM) 300 MG tablet, TAKE 1 TABLET BY MOUTH  DAILY, Disp: 90 tablet, Rfl: 0   amitriptyline (ELAVIL) 50 MG tablet, Take 50 mg by mouth daily. , Disp: , Rfl:    atenolol (TENORMIN) 100 MG tablet, Take 100 mg by mouth daily. , Disp: , Rfl:    budesonide (PULMICORT) 0.5 MG/2ML nebulizer solution, Take 0.5 mg by nebulization daily. (Patient not taking: Reported on 06/27/2023), Disp: , Rfl:    Colchicine 0.6 MG CAPS, TAKE 1 CAPSULE BY MOUTH DAILY, MAY TAKE 1 CAPSULE BY MOUTH TWICE DAILY DURING FLARE., Disp: 90 capsule, Rfl: 0   Continuous Glucose Receiver (DEXCOM G7 RECEIVER) DEVI, 1 each by miscellaneous route daily., Disp: , Rfl:    Continuous Glucose Sensor (DEXCOM G7 SENSOR) MISC, Use 1 every 10 days. Use as directed, Disp: , Rfl:    dicyclomine (BENTYL) 10 MG capsule, Take 10 mg by mouth 4 (four) times daily -  before meals and at bedtime., Disp: , Rfl:    doxycycline (VIBRA-TABS) 100 MG tablet, Take 1 tablet (100 mg total) by mouth 2 (two) times daily., Disp: 20 tablet, Rfl: 0   fluticasone (FLONASE) 50 MCG/ACT nasal spray, daily., Disp: , Rfl:    gabapentin (NEURONTIN) 600 MG tablet, TAKE ONE TABLET BY MOUTH 4 TIMES DAILY, Disp: 120 tablet, Rfl: 2   hydrOXYzine (VISTARIL) 50 MG capsule, as needed. , Disp: , Rfl:    LANTUS SOLOSTAR 100 UNIT/ML Solostar Pen, Take 40 units twice daily, Disp: , Rfl:    LATUDA 40 MG TABS tablet, Take 40 mg by mouth daily with breakfast. , Disp: , Rfl:    levothyroxine (SYNTHROID, LEVOTHROID) 300 MCG tablet, Take 250 mcg by mouth daily before breakfast., Disp: , Rfl:    magnesium 30 MG tablet, Take 30 mg by mouth 2 (two) times daily., Disp: , Rfl:    metFORMIN (GLUCOPHAGE) 1000 MG tablet, Take 1,000 mg by mouth daily with breakfast., Disp: , Rfl:     montelukast (SINGULAIR) 10 MG tablet, Take 10 mg by mouth at bedtime., Disp: , Rfl:    MOUNJARO 5 MG/0.5ML Pen, SMARTSIG:5 Milligram(s) SUB-Q Once a Week, Disp: , Rfl:    Multiple Vitamins-Minerals (CENTRUM WOMEN PO), Take 1 tablet by mouth daily., Disp: , Rfl:    omeprazole (PRILOSEC) 40 MG capsule, TAKE 1 CAPSULE IN THE MORNING AND 1 CAPSULE AT BEDTIME, Disp: 180 capsule, Rfl: 1   ONE TOUCH ULTRA TEST test strip, , Disp: , Rfl:    ONETOUCH DELICA LANCETS 33G MISC, , Disp: , Rfl:    oxyCODONE-acetaminophen (PERCOCET) 10-325 MG tablet, Take 1 tablet by mouth 4 (four) times daily. , Disp: , Rfl:    PARoxetine (PAXIL) 20 MG tablet, Take 40 mg by mouth daily., Disp: , Rfl:    promethazine (PHENERGAN) 25 MG tablet, TAKE 1 TABLET BY MOUTH EVERY 6 HOURS AS NEEDED FOR NAUSEA AND VOMITING, Disp: 15 tablet,  Rfl: 0   promethazine-dextromethorphan (PROMETHAZINE-DM) 6.25-15 MG/5ML syrup, Take 5 mLs by mouth 4 (four) times daily as needed for cough., Disp: 118 mL, Rfl: 0   rizatriptan (MAXALT) 10 MG tablet, TAKE 1 TABLET BY MOUTH AS NEEDED FOR MIGRAINE. MAY REPEAT IN 2 HOURS. MAX OF 2 TABS IN 24 HOURS, Disp: 10 tablet, Rfl: 12   rosuvastatin (CRESTOR) 10 MG tablet, Take 10 mg by mouth daily., Disp: , Rfl:    topiramate (TOPAMAX) 50 MG tablet, TAKE 4 TABLETS BY MOUTH ONCE DAILY IN THE EVENING. Have primary care refill going forward., Disp: 120 tablet, Rfl: 0   triamcinolone cream (KENALOG) 0.1 %, SMARTSIG:1 Application Topical 2-3 Times Daily, Disp: , Rfl:    VENTOLIN HFA 108 (90 BASE) MCG/ACT inhaler, Inhale 2 puffs into the lungs every 6 (six) hours as needed for shortness of breath. , Disp: , Rfl:   Observations/Objective: Patient is well-developed, well-nourished in no acute distress.  Resting comfortably  at home.  Head is normocephalic, atraumatic.  No labored breathing.  Speech is clear and coherent with logical content.  Patient is alert and oriented at baseline.  White patches visible on tongue  and in cheeks.   Assessment and Plan: 1. Thrush (Primary)  UC as needed.   Follow Up Instructions: I discussed the assessment and treatment plan with the patient. The patient was provided an opportunity to ask questions and all were answered. The patient agreed with the plan and demonstrated an understanding of the instructions.  A copy of instructions were sent to the patient via MyChart unless otherwise noted below.     The patient was advised to call back or seek an in-person evaluation if the symptoms worsen or if the condition fails to improve as anticipated.    Georgana Curio, FNP

## 2024-01-17 NOTE — Progress Notes (Signed)
 Reviewed patient's MRI brain from 01/14/24. Mild atrophy left hemisphere > right. 2 subcortical T2/FLAIR hyperintensities in left hemisphere; nonspecific and incidental to symptoms. Per radiologist, empty/enlarged sella which again is incidental to symptoms.   Electronically signed by: Harlene Raguel Cork, MD 01/17/2024 4:42 PM

## 2024-04-07 ENCOUNTER — Emergency Department (HOSPITAL_COMMUNITY)
Admission: EM | Admit: 2024-04-07 | Discharge: 2024-04-07 | Disposition: A | Discharge status: Home / Self Care | Attending: Emergency Medicine | Admitting: Emergency Medicine

## 2024-04-07 ENCOUNTER — Emergency Department (HOSPITAL_COMMUNITY)

## 2024-04-07 ENCOUNTER — Encounter (HOSPITAL_COMMUNITY): Payer: Self-pay

## 2024-04-07 ENCOUNTER — Other Ambulatory Visit: Payer: Self-pay

## 2024-04-07 DIAGNOSIS — S060X9A Concussion with loss of consciousness of unspecified duration, initial encounter: Secondary | ICD-10-CM | POA: Insufficient documentation

## 2024-04-07 DIAGNOSIS — R051 Acute cough: Secondary | ICD-10-CM | POA: Diagnosis not present

## 2024-04-07 DIAGNOSIS — S0990XA Unspecified injury of head, initial encounter: Secondary | ICD-10-CM | POA: Diagnosis present

## 2024-04-07 DIAGNOSIS — B9689 Other specified bacterial agents as the cause of diseases classified elsewhere: Secondary | ICD-10-CM

## 2024-04-07 DIAGNOSIS — W1809XA Striking against other object with subsequent fall, initial encounter: Secondary | ICD-10-CM | POA: Insufficient documentation

## 2024-04-07 DIAGNOSIS — M25512 Pain in left shoulder: Secondary | ICD-10-CM | POA: Diagnosis not present

## 2024-04-07 DIAGNOSIS — M542 Cervicalgia: Secondary | ICD-10-CM | POA: Insufficient documentation

## 2024-04-07 DIAGNOSIS — W19XXXA Unspecified fall, initial encounter: Secondary | ICD-10-CM

## 2024-04-07 DIAGNOSIS — Y9301 Activity, walking, marching and hiking: Secondary | ICD-10-CM | POA: Insufficient documentation

## 2024-04-07 LAB — CBC WITH DIFFERENTIAL/PLATELET
Abs Immature Granulocytes: 0.03 10*3/uL (ref 0.00–0.07)
Basophils Absolute: 0.1 10*3/uL (ref 0.0–0.1)
Basophils Relative: 1 %
Eosinophils Absolute: 0.6 10*3/uL — ABNORMAL HIGH (ref 0.0–0.5)
Eosinophils Relative: 6 %
HCT: 40 % (ref 36.0–46.0)
Hemoglobin: 12.9 g/dL (ref 12.0–15.0)
Immature Granulocytes: 0 %
Lymphocytes Relative: 31 %
Lymphs Abs: 3 10*3/uL (ref 0.7–4.0)
MCH: 28.7 pg (ref 26.0–34.0)
MCHC: 32.3 g/dL (ref 30.0–36.0)
MCV: 88.9 fL (ref 80.0–100.0)
Monocytes Absolute: 0.4 10*3/uL (ref 0.1–1.0)
Monocytes Relative: 4 %
Neutro Abs: 5.6 10*3/uL (ref 1.7–7.7)
Neutrophils Relative %: 58 %
Platelets: 235 10*3/uL (ref 150–400)
RBC: 4.5 MIL/uL (ref 3.87–5.11)
RDW: 14.8 % (ref 11.5–15.5)
WBC: 9.7 10*3/uL (ref 4.0–10.5)
nRBC: 0 % (ref 0.0–0.2)

## 2024-04-07 LAB — BASIC METABOLIC PANEL WITH GFR
Anion gap: 12 (ref 5–15)
BUN: 23 mg/dL — ABNORMAL HIGH (ref 6–20)
CO2: 23 mmol/L (ref 22–32)
Calcium: 8.9 mg/dL (ref 8.9–10.3)
Chloride: 105 mmol/L (ref 98–111)
Creatinine, Ser: 1.47 mg/dL — ABNORMAL HIGH (ref 0.44–1.00)
GFR, Estimated: 43 mL/min — ABNORMAL LOW (ref >60)
Glucose, Bld: 179 mg/dL — ABNORMAL HIGH (ref 70–99)
Potassium: 3.6 mmol/L (ref 3.5–5.1)
Sodium: 140 mmol/L (ref 135–145)

## 2024-04-07 LAB — SARS CORONAVIRUS 2 BY RT PCR: SARS Coronavirus 2 by RT PCR: NEGATIVE

## 2024-04-07 LAB — I-STAT CG4 LACTIC ACID, ED: Lactic Acid, Venous: 1.1 mmol/L (ref 0.5–1.9)

## 2024-04-07 MED ORDER — FENTANYL CITRATE PF 50 MCG/ML IJ SOSY
50.0000 ug | PREFILLED_SYRINGE | Freq: Once | INTRAMUSCULAR | Status: AC
  Administered 2024-04-07: 50 ug via INTRAVENOUS
  Filled 2024-04-07: qty 1

## 2024-04-07 MED ORDER — DOXYCYCLINE HYCLATE 100 MG PO TABS: 100.0000 mg | ORAL_TABLET | Freq: Two times a day (BID) | ORAL | 0 refills | Status: AC

## 2024-04-07 MED ORDER — OXYCODONE-ACETAMINOPHEN 5-325 MG PO TABS
1.0000 | ORAL_TABLET | Freq: Once | ORAL | Status: AC
  Administered 2024-04-07: 1 via ORAL
  Filled 2024-04-07: qty 1

## 2024-04-07 MED ORDER — FLUCONAZOLE 200 MG PO TABS: 200.0000 mg | ORAL_TABLET | Freq: Every day | ORAL | 0 refills | Status: AC

## 2024-04-07 MED ORDER — SODIUM CHLORIDE 0.9 % IV BOLUS
500.0000 mL | Freq: Once | INTRAVENOUS | Status: AC
  Administered 2024-04-07: 500 mL via INTRAVENOUS

## 2024-04-07 NOTE — ED Notes (Signed)
 Should sling applied by ortho tech. Pt ambulated with cane in hallway with steady gait.

## 2024-04-07 NOTE — ED Notes (Signed)
Ortho tech called for shoulder sling

## 2024-04-07 NOTE — ED Notes (Signed)
 Patient transported to CT

## 2024-04-07 NOTE — ED Provider Notes (Signed)
 [place] [facility] AT [facility] Provider Note   CSN: [id number] Arrival date & time: 04/07/24  1132     History  Chief Complaint  Patient presents with   Fall   Loss of Consciousness   Head Injury    Lynn Ponce is a 48 y.o. female.  She is brought in by EMS from home after a fall syncopal event.  She said she was walking too fast got lightheaded fell hitting her head.  Complaining of pain in her head neck left shoulder.  EMS reports was intermittently altered.  She also states her cough.  She thinks she had a syncopal event.  The history is provided by the patient and the EMS personnel.  Fall This is a new problem. The problem has not changed since onset.Associated symptoms include headaches. Pertinent negatives include no chest pain, no abdominal pain and no shortness of breath. Associated symptoms comments: Neck left shoulder. The symptoms are aggravated by bending and twisting. Nothing relieves the symptoms.  Loss of Consciousness Episode history:  Single Most recent episode:  Today Associated symptoms: headaches   Associated symptoms: no chest pain, no fever and no shortness of breath   Head Injury Associated symptoms: headache and neck pain        Home Medications Prior to Admission medications   Medication Sig Start Date End Date Taking? Authorizing Provider  allopurinol  (ZYLOPRIM ) 300 MG tablet TAKE 1 TABLET BY MOUTH  DAILY 07/12/21   Romayne Clubs, PA-C  amitriptyline (ELAVIL) 50 MG tablet Take 50 mg by mouth daily.  10/12/15   [provider]  atenolol (TENORMIN) 100 MG tablet Take 100 mg by mouth daily.  03/13/17   [provider]  budesonide (PULMICORT) 0.5 MG/2ML nebulizer solution Take 0.5 mg by nebulization daily. Patient not taking: Reported on 06/27/2023 01/15/18   [provider]  Colchicine  0.6 MG CAPS TAKE 1 CAPSULE BY MOUTH DAILY, MAY TAKE 1 CAPSULE BY MOUTH TWICE DAILY DURING FLARE. 10/09/21   Romayne Clubs, PA-C  Continuous Glucose Receiver (DEXCOM G7 RECEIVER) DEVI 1 each by miscellaneous route daily. 04/30/23   [provider]  Continuous Glucose Sensor (DEXCOM G7 SENSOR) MISC Use 1 every 10 days. Use as directed 04/30/23   [provider]  dicyclomine (BENTYL) 10 MG capsule Take 10 mg by mouth 4 (four) times daily -  before meals and at bedtime.    [provider]  doxycycline  (VIBRA -TABS) 100 MG tablet Take 1 tablet (100 mg total) by mouth 2 (two) times daily. 12/31/23   Farris Hong, PA-C  fluticasone (FLONASE) 50 MCG/ACT nasal spray daily.    [provider]  gabapentin  (NEURONTIN ) 600 MG tablet TAKE ONE TABLET BY MOUTH 4 TIMES DAILY 05/29/18   Willis, Charles K, MD  hydrOXYzine (VISTARIL) 50 MG capsule as needed.  03/25/17   [provider]  LANTUS SOLOSTAR 100 UNIT/ML Solostar Pen Take 40 units twice daily 06/07/23   [provider]  LATUDA 40 MG TABS tablet Take 40 mg by mouth daily with breakfast.  10/11/15   [provider]  levothyroxine (SYNTHROID, LEVOTHROID) 300 MCG tablet Take 250 mcg by mouth daily before breakfast. 10/01/18   [provider]  magnesium 30 MG tablet Take 30 mg by mouth 2 (two) times daily.    [provider]  metFORMIN (GLUCOPHAGE) 1000 MG tablet Take 1,000 mg by mouth daily with breakfast.    [provider]  montelukast (SINGULAIR) 10  MG tablet Take 10 mg by mouth at bedtime.    [provider]  MOUNJARO 5 MG/0.5ML Pen SMARTSIG:5 Milligram(s) SUB-Q Once a Week    [provider]  Multiple Vitamins-Minerals (CENTRUM WOMEN PO) Take 1 tablet by mouth daily.    [provider]  omeprazole  (PRILOSEC) 40 MG capsule TAKE 1 CAPSULE IN THE MORNING AND 1 CAPSULE AT BEDTIME 10/30/23   Edmonia Gottron, PA-C  ONE TOUCH ULTRA TEST test strip  01/29/18   [provider]  Raeford Bullion LANCETS 33G MISC  01/28/18   [provider]   oxyCODONE -acetaminophen  (PERCOCET) 10-325 MG tablet Take 1 tablet by mouth 4 (four) times daily.  10/12/15   [provider]  PARoxetine (PAXIL) 20 MG tablet Take 40 mg by mouth daily. 05/01/21   [provider]  promethazine  (PHENERGAN ) 25 MG tablet TAKE 1 TABLET BY MOUTH EVERY 6 HOURS AS NEEDED FOR NAUSEA AND VOMITING 05/16/20   Glory Larsen, MD  promethazine -dextromethorphan (PROMETHAZINE -DM) 6.25-15 MG/5ML syrup Take 5 mLs by mouth 4 (four) times daily as needed for cough. 12/31/23   Farris Hong, PA-C  rizatriptan  (MAXALT ) 10 MG tablet TAKE 1 TABLET BY MOUTH AS NEEDED FOR MIGRAINE. MAY REPEAT IN 2 HOURS. MAX OF 2 TABS IN 24 HOURS 11/22/19   Glory Larsen, MD  rosuvastatin (CRESTOR) 10 MG tablet Take 10 mg by mouth daily. 03/25/19   [provider]  topiramate  (TOPAMAX ) 50 MG tablet TAKE 4 TABLETS BY MOUTH ONCE DAILY IN THE EVENING. Have primary care refill going forward. 09/04/21   Glory Larsen, MD  triamcinolone  cream (KENALOG ) 0.1 % SMARTSIG:1 Application Topical 2-3 Times Daily 03/15/20   [provider]  VENTOLIN HFA 108 (90 BASE) MCG/ACT inhaler Inhale 2 puffs into the lungs every 6 (six) hours as needed for shortness of breath.  11/04/15   [provider]      Allergies    Sulfonamide derivatives, Erythromycin, Ibuprofen, Mushroom extract complex (obsolete), Toradol  [ketorolac  tromethamine ], Varenicline, and Azithromycin    Review of Systems   Review of Systems  Constitutional:  Negative for fever.  HENT:  Negative for sore throat.   Respiratory:  Positive for cough. Negative for shortness of breath.   Cardiovascular:  Positive for syncope. Negative for chest pain.  Gastrointestinal:  Negative for abdominal pain.  Genitourinary:  Negative for dysuria.  Musculoskeletal:  Positive for neck pain.  Neurological:  Positive for headaches.    Physical Exam Updated Vital Signs BP (!) 80/51 (BP Location: Left Arm)   Pulse 79    Temp 97.9 F (36.6 C)   SpO2 98%  Physical Exam Vitals and nursing note reviewed.  Constitutional:      General: She is not in acute distress.    Appearance: Normal appearance. She is well-developed. She is obese.  HENT:     Head: Normocephalic and atraumatic.  Eyes:     Conjunctiva/sclera: Conjunctivae normal.  Neck:     Comments: In cervical Collar trach midline Cardiovascular:     Rate and Rhythm: Normal rate and regular rhythm.     Heart sounds: No murmur heard. Pulmonary:     Effort: Pulmonary effort is normal. No respiratory distress.     Breath sounds: Normal breath sounds. No stridor. No wheezing.  Abdominal:     Palpations: Abdomen is soft.     Tenderness: There is no abdominal tenderness. There is no guarding or rebound.  Musculoskeletal:  General: Tenderness (left shoulder) present. No deformity. Normal range of motion.     Cervical back: Tenderness present.  Skin:    General: Skin is warm and dry.  Neurological:     General: No focal deficit present.     Mental Status: She is alert.     GCS: GCS eye subscore is 4. GCS verbal subscore is 5. GCS motor subscore is 6.     Sensory: No sensory deficit.     Motor: No weakness.     ED Results / Procedures / Treatments   Labs (all labs ordered are listed, but only abnormal results are displayed) Labs Reviewed  BASIC METABOLIC PANEL WITH GFR - Abnormal; Notable for the following components:      Result Value   Glucose, Bld 179 (*)    BUN 23 (*)    Creatinine, Ser 1.47 (*)    GFR, Estimated 43 (*)    All other components within normal limits  CBC WITH DIFFERENTIAL/PLATELET - Abnormal; Notable for the following components:   Eosinophils Absolute 0.6 (*)    All other components within normal limits  SARS CORONAVIRUS 2 BY RT PCR  I-STAT CG4 LACTIC ACID, ED    EKG EKG Interpretation Date/Time:  Tuesday Apr 07 2024 11:42:01 EDT Ventricular Rate:  80 PR Interval:  201 QRS Duration:  108 QT  Interval:  374 QTC Calculation: 432 R Axis:   21  Text Interpretation: Sinus rhythm Nonspecific T abnormalities, lateral leads No significant change since prior 1/11 Confirmed by Racheal Buddle [phone]) on 04/07/2024 11:45:13 AM  Radiology DG Shoulder Left Result Date: 04/07/2024 CLINICAL DATA:  Left shoulder pain following a fall. EXAM: LEFT SHOULDER - 2+ VIEW COMPARISON:  None Available. FINDINGS: There is no evidence of fracture or dislocation. There is no evidence of arthropathy or other focal bone abnormality. Soft tissues are unremarkable. IMPRESSION: Negative. Electronically Signed   By: Catherin Closs M.D.   On: 04/07/2024 15:10   DG Chest 1 View Result Date: 04/07/2024 CLINICAL DATA:  Cough.  Fell. EXAM: CHEST  1 VIEW COMPARISON:  03/06/2023 FINDINGS: The heart size and mediastinal contours are within normal limits. Both lungs are clear. The visualized skeletal structures are unremarkable. IMPRESSION: No active disease. Electronically Signed   By: Catherin Closs M.D.   On: 04/07/2024 15:10   CT Head Wo Contrast Result Date: 04/07/2024 CLINICAL DATA:  Head and neck trauma, intoxication EXAM: CT HEAD WITHOUT CONTRAST CT CERVICAL SPINE WITHOUT CONTRAST TECHNIQUE: Multidetector CT imaging of the head and cervical spine was performed following the standard protocol without intravenous contrast. Multiplanar CT image reconstructions of the cervical spine were also generated. RADIATION DOSE REDUCTION: This exam was performed according to the departmental dose-optimization program which includes automated exposure control, adjustment of the mA and/or kV according to patient size and/or use of iterative reconstruction technique. COMPARISON:  None Available. FINDINGS: CT HEAD FINDINGS Brain: No evidence of acute infarction, hemorrhage, hydrocephalus, extra-axial collection or mass lesion/mass effect. Vascular: No hyperdense vessel or unexpected calcification. Skull: Normal. Negative for fracture or focal  lesion. Sinuses/Orbits: Mucosal thickening of the bilateral maxillary sinuses. Status post bilateral maxillary antrostomy. Other: None. CT CERVICAL SPINE FINDINGS Alignment: Normal. Skull base and vertebrae: No acute fracture. No primary bone lesion or focal pathologic process. Soft tissues and spinal canal: No prevertebral fluid or swelling. No visible canal hematoma. Disc levels: Mild disc degenerative disease of the lower cervical levels. Upper chest: Negative. Other: None. IMPRESSION: 1. No acute intracranial pathology. 2.  No fracture or static subluxation of the cervical spine. 3. Mild disc degenerative disease of the lower cervical levels. Electronically Signed   By: Fredricka Jenny M.D.   On: 04/07/2024 13:18   CT Cervical Spine Wo Contrast Result Date: 04/07/2024 CLINICAL DATA:  Head and neck trauma, intoxication EXAM: CT HEAD WITHOUT CONTRAST CT CERVICAL SPINE WITHOUT CONTRAST TECHNIQUE: Multidetector CT imaging of the head and cervical spine was performed following the standard protocol without intravenous contrast. Multiplanar CT image reconstructions of the cervical spine were also generated. RADIATION DOSE REDUCTION: This exam was performed according to the departmental dose-optimization program which includes automated exposure control, adjustment of the mA and/or kV according to patient size and/or use of iterative reconstruction technique. COMPARISON:  None Available. FINDINGS: CT HEAD FINDINGS Brain: No evidence of acute infarction, hemorrhage, hydrocephalus, extra-axial collection or mass lesion/mass effect. Vascular: No hyperdense vessel or unexpected calcification. Skull: Normal. Negative for fracture or focal lesion. Sinuses/Orbits: Mucosal thickening of the bilateral maxillary sinuses. Status post bilateral maxillary antrostomy. Other: None. CT CERVICAL SPINE FINDINGS Alignment: Normal. Skull base and vertebrae: No acute fracture. No primary bone lesion or focal pathologic process. Soft  tissues and spinal canal: No prevertebral fluid or swelling. No visible canal hematoma. Disc levels: Mild disc degenerative disease of the lower cervical levels. Upper chest: Negative. Other: None. IMPRESSION: 1. No acute intracranial pathology. 2. No fracture or static subluxation of the cervical spine. 3. Mild disc degenerative disease of the lower cervical levels. Electronically Signed   By: Fredricka Jenny M.D.   On: 04/07/2024 13:18    Procedures Procedures    Medications Ordered in ED Medications  fentaNYL (SUBLIMAZE) injection 50 mcg (50 mcg Intravenous Given 04/07/24 1219)  sodium chloride  0.9 % bolus 500 mL (0 mLs Intravenous Stopped 04/07/24 1400)  fentaNYL (SUBLIMAZE) injection 50 mcg (50 mcg Intravenous Given 04/07/24 1306)  oxyCODONE -acetaminophen  (PERCOCET/ROXICET) 5-325 MG per tablet 1 tablet (1 tablet Oral Given 04/07/24 1435)    ED Course/ Medical Decision Making/ A&P Clinical Course as of 04/07/24 1712  Tue Apr 07, 2024  1328 Head and cervical spine CT negative for acute fracture or bleed.  Cervical collar removed. [MB]  1328 Blood pressure remains low although awake and alert. [MB]    Clinical Course User Index [MB] Tonya Fredrickson, MD                                 Medical Decision Making Amount and/or Complexity of Data Reviewed Labs: ordered. Radiology: ordered.  Risk Prescription drug management.   This patient complains of syncope fall head injury neck injury shoulder injury; this involves an extensive number of treatment Options and is a complaint that carries with it a high risk of complications and morbidity. The differential includes fracture, contusion, bleed, dislocation, dehydration, medication side effect  I ordered, reviewed and interpreted labs, which included CBC normal chemistries with mildly elevated creatinine COVID-negative lactate normal I ordered medication IV and oral pain medicine and reviewed PMP when indicated. I ordered imaging  studies which included chest x-ray left shoulder x-ray CT head and cervical spine and I independently    visualized and interpreted imaging which showed no acute traumatic findings Additional history obtained from EMS Previous records obtained and reviewed in epic including recent family and neurology notes Cardiac monitoring reviewed, normal sinus rhythm Social determinants considered, no significant barriers Critical Interventions: None  After the interventions stated above, I reevaluated  the patient and found patient to be symptomatically improved and able to ambulate in the department without any difficulty Admission and further testing considered, no indications for admission.  Will need close follow-up with her treatment team.  Return instructions discussed         Final Clinical Impression(s) / ED Diagnoses Final diagnoses:  Fall, initial encounter  Injury of head, initial encounter  Acute pain of left shoulder  Acute cough    Rx / DC Orders ED Discharge Orders          Ordered    doxycycline  (VIBRA -TABS) 100 MG tablet  2 times daily        04/07/24 1521    fluconazole (DIFLUCAN) 200 MG tablet  Daily        04/07/24 1605              Tonya Fredrickson, MD 04/07/24 1714

## 2024-04-07 NOTE — Discharge Instructions (Signed)
 You were seen in the emergency department for possible fainting episode fall injuring your head neck and left shoulder.  You had CAT scans and x-rays along with blood work that did not show an obvious explanation for your symptoms.  Please use ice for your shoulder and sling for comfort.  We are putting you on an antibiotic for your cough.  Follow-up with your primary care doctor.  Return to the emergency department if any worsening or concerning symptoms

## 2024-04-07 NOTE — ED Triage Notes (Signed)
 Patient BIB EMS from home after a fall. Patient states that she had a fainting spell which caused her to become dizzy upon standing leading to the fall. Patient is not oriented to place or situation, only oriented to person. Patient C/O head, shoulder, and neck pain.

## 2024-04-07 NOTE — Progress Notes (Signed)
 Orthopedic Tech Progress Note Patient Details:  Lynn Ponce [birth year] [id number]  Ortho Devices Type of Ortho Device: Arm sling Ortho Device/Splint Location: LUE Ortho Device/Splint Interventions: Ordered, Application, Adjustment   Post Interventions Patient Tolerated: Well Instructions Provided: Care of device  Kermitt Pedlar 04/07/2024, 3:52 PM

## 2024-04-07 NOTE — ED Notes (Signed)
 Pillow provideed

## 2024-04-25 ENCOUNTER — Other Ambulatory Visit: Payer: Self-pay | Admitting: Physician Assistant

## 2024-05-21 ENCOUNTER — Emergency Department (HOSPITAL_COMMUNITY)
Admission: EM | Admit: 2024-05-21 | Discharge: 2024-05-22 | Disposition: A | Discharge status: Home / Self Care | Attending: Emergency Medicine | Admitting: Emergency Medicine

## 2024-05-21 ENCOUNTER — Emergency Department (HOSPITAL_COMMUNITY)

## 2024-05-21 ENCOUNTER — Other Ambulatory Visit: Payer: Self-pay

## 2024-05-21 ENCOUNTER — Encounter (HOSPITAL_COMMUNITY): Payer: Self-pay | Admitting: Emergency Medicine

## 2024-05-21 DIAGNOSIS — E119 Type 2 diabetes mellitus without complications: Secondary | ICD-10-CM | POA: Diagnosis not present

## 2024-05-21 DIAGNOSIS — F444 Conversion disorder with motor symptom or deficit: Secondary | ICD-10-CM | POA: Diagnosis not present

## 2024-05-21 DIAGNOSIS — Z79899 Other long term (current) drug therapy: Secondary | ICD-10-CM | POA: Diagnosis not present

## 2024-05-21 DIAGNOSIS — R202 Paresthesia of skin: Secondary | ICD-10-CM | POA: Diagnosis not present

## 2024-05-21 DIAGNOSIS — R4701 Aphasia: Secondary | ICD-10-CM | POA: Diagnosis not present

## 2024-05-21 DIAGNOSIS — R791 Abnormal coagulation profile: Secondary | ICD-10-CM | POA: Insufficient documentation

## 2024-05-21 DIAGNOSIS — R479 Unspecified speech disturbances: Secondary | ICD-10-CM | POA: Insufficient documentation

## 2024-05-21 DIAGNOSIS — I10 Essential (primary) hypertension: Secondary | ICD-10-CM | POA: Insufficient documentation

## 2024-05-21 DIAGNOSIS — R531 Weakness: Secondary | ICD-10-CM | POA: Insufficient documentation

## 2024-05-21 DIAGNOSIS — Z7984 Long term (current) use of oral hypoglycemic drugs: Secondary | ICD-10-CM | POA: Insufficient documentation

## 2024-05-21 LAB — CBC
HCT: 33.7 % — ABNORMAL LOW (ref 36.0–46.0)
Hemoglobin: 10.5 g/dL — ABNORMAL LOW (ref 12.0–15.0)
MCH: 29.1 pg (ref 26.0–34.0)
MCHC: 31.2 g/dL (ref 30.0–36.0)
MCV: 93.4 fL (ref 80.0–100.0)
Platelets: 178 10*3/uL (ref 150–400)
RBC: 3.61 MIL/uL — ABNORMAL LOW (ref 3.87–5.11)
RDW: 15.2 % (ref 11.5–15.5)
WBC: 9.4 10*3/uL (ref 4.0–10.5)
nRBC: 0 % (ref 0.0–0.2)

## 2024-05-21 LAB — I-STAT CHEM 8, ED
BUN: 17 mg/dL (ref 6–20)
Calcium, Ion: 1.01 mmol/L — ABNORMAL LOW (ref 1.15–1.40)
Chloride: 111 mmol/L (ref 98–111)
Creatinine, Ser: 1.3 mg/dL — ABNORMAL HIGH (ref 0.44–1.00)
Glucose, Bld: 99 mg/dL (ref 70–99)
HCT: 30 % — ABNORMAL LOW (ref 36.0–46.0)
Hemoglobin: 10.2 g/dL — ABNORMAL LOW (ref 12.0–15.0)
Potassium: 3.2 mmol/L — ABNORMAL LOW (ref 3.5–5.1)
Sodium: 144 mmol/L (ref 135–145)
TCO2: 20 mmol/L — ABNORMAL LOW (ref 22–32)

## 2024-05-21 LAB — COMPREHENSIVE METABOLIC PANEL WITH GFR
ALT: 18 U/L (ref 0–44)
AST: 20 U/L (ref 15–41)
Albumin: 2.9 g/dL — ABNORMAL LOW (ref 3.5–5.0)
Alkaline Phosphatase: 54 U/L (ref 38–126)
Anion gap: 10 (ref 5–15)
BUN: 18 mg/dL (ref 6–20)
CO2: 19 mmol/L — ABNORMAL LOW (ref 22–32)
Calcium: 7.6 mg/dL — ABNORMAL LOW (ref 8.9–10.3)
Chloride: 114 mmol/L — ABNORMAL HIGH (ref 98–111)
Creatinine, Ser: 1.26 mg/dL — ABNORMAL HIGH (ref 0.44–1.00)
GFR, Estimated: 52 mL/min — ABNORMAL LOW (ref >60)
Glucose, Bld: 105 mg/dL — ABNORMAL HIGH (ref 70–99)
Potassium: 3.3 mmol/L — ABNORMAL LOW (ref 3.5–5.1)
Sodium: 143 mmol/L (ref 135–145)
Total Bilirubin: 0.3 mg/dL (ref 0.0–1.2)
Total Protein: 5.1 g/dL — ABNORMAL LOW (ref 6.5–8.1)

## 2024-05-21 LAB — DIFFERENTIAL
Abs Immature Granulocytes: 0.02 10*3/uL (ref 0.00–0.07)
Basophils Absolute: 0.1 10*3/uL (ref 0.0–0.1)
Basophils Relative: 1 %
Eosinophils Absolute: 0.3 10*3/uL (ref 0.0–0.5)
Eosinophils Relative: 4 %
Immature Granulocytes: 0 %
Lymphocytes Relative: 31 %
Lymphs Abs: 2.9 10*3/uL (ref 0.7–4.0)
Monocytes Absolute: 0.7 10*3/uL (ref 0.1–1.0)
Monocytes Relative: 7 %
Neutro Abs: 5.4 10*3/uL (ref 1.7–7.7)
Neutrophils Relative %: 57 %

## 2024-05-21 LAB — PROTIME-INR
INR: 1 (ref 0.8–1.2)
Prothrombin Time: 14.1 s (ref 11.4–15.2)

## 2024-05-21 LAB — APTT: aPTT: 29 s (ref 24–36)

## 2024-05-21 LAB — ETHANOL: Alcohol, Ethyl (B): <15 mg/dL (ref <15)

## 2024-05-21 MED ORDER — SODIUM CHLORIDE 0.9% FLUSH: 3.0000 mL | Freq: Once | INTRAVENOUS | Status: DC

## 2024-05-21 MED ORDER — LORAZEPAM 2 MG/ML IJ SOLN
2.0000 mg | Freq: Once | INTRAMUSCULAR | Status: AC
  Administered 2024-05-21: 2 mg via INTRAVENOUS

## 2024-05-21 MED ORDER — LORAZEPAM 2 MG/ML IJ SOLN
INTRAMUSCULAR | Status: AC
Start: 2024-05-21 — End: 2024-05-21
  Filled 2024-05-21: qty 1

## 2024-05-21 NOTE — ED Notes (Signed)
 Pt back from MRI and CT in room.

## 2024-05-21 NOTE — ED Provider Notes (Addendum)
 [place] [facility] DEPARTMENT AT [facility] Provider Note   CSN: [id number] Arrival date & time: 05/21/24  2026  An emergency department physician performed an initial assessment on this suspected stroke patient at 2035.  Patient presents with: Altered Mental Status   Lynn Ponce is a 48 y.o. female.   48 year old female with a history of conversion disorder, chronic functional neurologic symptoms, myoclonus, hypertension, hyperlipidemia, and diabetes who presents emergency department with weakness and difficulty speaking.  Patient reports that at 7:30 PM today she started experiencing difficulty speaking with some right arm and right leg weakness and numbness.  No history of a stroke.  Not on blood thinners.       Prior to Admission medications   Medication Sig Start Date End Date Taking? Authorizing Provider  allopurinol  (ZYLOPRIM ) 300 MG tablet TAKE 1 TABLET BY MOUTH  DAILY 07/12/21   Cheryl Waddell HERO, PA-C  amitriptyline (ELAVIL) 50 MG tablet Take 50 mg by mouth daily.  10/12/15   [provider]  atenolol (TENORMIN) 100 MG tablet Take 100 mg by mouth daily.  03/13/17   [provider]  budesonide (PULMICORT) 0.5 MG/2ML nebulizer solution Take 0.5 mg by nebulization daily. Patient not taking: Reported on 06/27/2023 01/15/18   [provider]  Colchicine  0.6 MG CAPS TAKE 1 CAPSULE BY MOUTH DAILY, MAY TAKE 1 CAPSULE BY MOUTH TWICE DAILY DURING FLARE. 10/09/21   Cheryl Waddell HERO, PA-C  Continuous Glucose Receiver (DEXCOM G7 RECEIVER) DEVI 1 each by miscellaneous route daily. 04/30/23   [provider]  Continuous Glucose Sensor (DEXCOM G7 SENSOR) MISC Use 1 every 10 days. Use as directed 04/30/23   [provider]  dicyclomine (BENTYL) 10 MG capsule Take 10 mg by mouth 4 (four) times daily -  before meals and at bedtime.    [provider]  doxycycline  (VIBRA -TABS) 100 MG tablet Take 1 tablet (100 mg total) by mouth 2 (two)  times daily. 04/07/24   Towana Ozell BROCKS, MD  fluconazole  (DIFLUCAN ) 200 MG tablet Take 1 tablet (200 mg total) by mouth daily. Take one tablet orally. If you develop signs of a yeast infection, take the second pill in 3 days. 04/07/24   Pamella Ozell LABOR, DO  fluticasone (FLONASE) 50 MCG/ACT nasal spray daily.    [provider]  gabapentin  (NEURONTIN ) 600 MG tablet TAKE ONE TABLET BY MOUTH 4 TIMES DAILY 05/29/18   Willis, Charles K, MD  hydrOXYzine (VISTARIL) 50 MG capsule as needed.  03/25/17   [provider]  LANTUS SOLOSTAR 100 UNIT/ML Solostar Pen Take 40 units twice daily 06/07/23   [provider]  LATUDA 40 MG TABS tablet Take 40 mg by mouth daily with breakfast.  10/11/15   [provider]  levothyroxine (SYNTHROID, LEVOTHROID) 300 MCG tablet Take 250 mcg by mouth daily before breakfast. 10/01/18   [provider]  magnesium 30 MG tablet Take 30 mg by mouth 2 (two) times daily.    [provider]  metFORMIN (GLUCOPHAGE) 1000 MG tablet Take 1,000 mg by mouth daily with breakfast.    [provider]  montelukast (SINGULAIR) 10 MG tablet Take 10 mg by mouth at bedtime.    [provider]  MOUNJARO 5 MG/0.5ML Pen SMARTSIG:5 Milligram(s) SUB-Q Once a Week    [provider]  Multiple Vitamins-Minerals (CENTRUM WOMEN PO) Take 1 tablet by mouth daily.    [provider]  omeprazole  (PRILOSEC) 40 MG capsule TAKE 1 CAPSULE IN THE  MORNING AND 1 CAPSULE AT BEDTIME 10/30/23   Craig Alan SAUNDERS, PA-C  ONE TOUCH ULTRA TEST test strip  01/29/18   [provider]  AISHA PASTOR LANCETS 33G MISC  01/28/18   [provider]  oxyCODONE -acetaminophen  (PERCOCET) 10-325 MG tablet Take 1 tablet by mouth 4 (four) times daily.  10/12/15   [provider]  PARoxetine (PAXIL) 20 MG tablet Take 40 mg by mouth daily. 05/01/21   [provider]  promethazine  (PHENERGAN ) 25 MG tablet TAKE 1 TABLET BY  MOUTH EVERY 6 HOURS AS NEEDED FOR NAUSEA AND VOMITING 05/16/20   Ines Onetha NOVAK, MD  promethazine -dextromethorphan (PROMETHAZINE -DM) 6.25-15 MG/5ML syrup Take 5 mLs by mouth 4 (four) times daily as needed for cough. 12/31/23   Gladis Elsie BROCKS, PA-C  rizatriptan  (MAXALT ) 10 MG tablet TAKE 1 TABLET BY MOUTH AS NEEDED FOR MIGRAINE. MAY REPEAT IN 2 HOURS. MAX OF 2 TABS IN 24 HOURS 11/22/19   Ines Onetha NOVAK, MD  rosuvastatin (CRESTOR) 10 MG tablet Take 10 mg by mouth daily. 03/25/19   [provider]  topiramate  (TOPAMAX ) 50 MG tablet TAKE 4 TABLETS BY MOUTH ONCE DAILY IN THE EVENING. Have primary care refill going forward. 09/04/21   Ines Onetha NOVAK, MD  triamcinolone  cream (KENALOG ) 0.1 % SMARTSIG:1 Application Topical 2-3 Times Daily 03/15/20   [provider]  VENTOLIN HFA 108 (90 BASE) MCG/ACT inhaler Inhale 2 puffs into the lungs every 6 (six) hours as needed for shortness of breath.  11/04/15   [provider]    Allergies: Sulfonamide derivatives, Erythromycin, Ibuprofen, Mushroom extract complex (obsolete), Toradol  [ketorolac  tromethamine ], Varenicline, and Azithromycin    Review of Systems  Updated Vital Signs BP 134/79   Pulse 82   Temp 98.1 F (36.7 C) (Oral)   Resp 14   Wt 123.4 kg   SpO2 100%   BMI 41.36 kg/m   Physical Exam Constitutional:      Comments: Very anxious appearing.  Speaking in soft voice   Eyes:     Extraocular Movements: Extraocular movements intact.     Conjunctiva/sclera: Conjunctivae normal.     Pupils: Pupils are equal, round, and reactive to light.  Pulmonary:     Effort: Pulmonary effort is normal.  Neurological:     Comments: Cranial nerves II through XII intact.  Does have some mild expressive aphasia.  Weakness of the right upper and right lower extremity noted.  Diminished sensation of the right upper and right lower extremity.     (all labs ordered are listed, but only abnormal results are displayed) Labs Reviewed   CBC - Abnormal; Notable for the following components:      Result Value   RBC 3.61 (*)    Hemoglobin 10.5 (*)    HCT 33.7 (*)    All other components within normal limits  COMPREHENSIVE METABOLIC PANEL WITH GFR - Abnormal; Notable for the following components:   Potassium 3.3 (*)    Chloride 114 (*)    CO2 19 (*)    Glucose, Bld 105 (*)    Creatinine, Ser 1.26 (*)    Calcium 7.6 (*)    Total Protein 5.1 (*)    Albumin 2.9 (*)    GFR, Estimated 52 (*)    All other components within normal limits  I-STAT CHEM 8, ED - Abnormal; Notable for the following components:   Potassium 3.2 (*)    Creatinine, Ser 1.30 (*)    Calcium, Ion 1.01 (*)  TCO2 20 (*)    Hemoglobin 10.2 (*)    HCT 30.0 (*)    All other components within normal limits  PROTIME-INR  APTT  ETHANOL  DIFFERENTIAL  RAPID URINE DRUG SCREEN, HOSP PERFORMED  URINALYSIS, ROUTINE W REFLEX MICROSCOPIC  CBG MONITORING, ED    EKG: None  Radiology: MR BRAIN WO CONTRAST Result Date: 05/21/2024 EXAM DESCRIPTION: MR BRAIN WO CONTRAST CLINICAL HISTORY: Neuro deficit, acute, stroke suspected COMPARISON: February 2025. TECHNIQUE: Non contrast MRI of the brain is performed according to our usual protocol including multiplanar multi sequence technique. FINDINGS: No acute intracranial hemorrhage, mass, edema, or hydrocephalus. No significant cortical atrophy. No encephalomalacia. Stable area of mild white matter disease in the left frontal region measuring up to 5 mm. No otherwise matter changes. The vascular flow voids are unremarkable. Mild chronic appearing maxillary sinus disease. IMPRESSION: No acute intracranial pathology. Stable small area of nonspecific white matter disease in the left frontal region. 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 If there are any new symptoms, follow-up MRI with and without contrast would be recommended to assess for any new white matter changes. Mild chronic appearing  sinus disease. Electronically signed by: Reyes Frees MD 05/21/2024 09:19 PM EDT RP Workstation: [id number]   CT HEAD CODE STROKE WO CONTRAST Result Date: 05/21/2024 CLINICAL DATA:  Code stroke.  Neuro deficit, acute, stroke suspected EXAM: CT HEAD WITHOUT CONTRAST TECHNIQUE: Contiguous axial images were obtained from the base of the skull through the vertex without intravenous contrast. RADIATION DOSE REDUCTION: This exam was performed according to the departmental dose-optimization program which includes automated exposure control, adjustment of the mA and/or kV according to patient size and/or use of iterative reconstruction technique. COMPARISON:  CT head Apr 07, 2024. FINDINGS: Brain: No evidence of acute infarction, hemorrhage, hydrocephalus, extra-axial collection or mass lesion/mass effect. Vascular: No hyperdense vessel. Skull: No acute fracture. Sinuses/Orbits: Mostly clear sinuses.  No acute orbital. ASPECTS [facility] Stroke Program Early CT Score) Total score (0-10 with 10 being normal): 10. IMPRESSION: No evidence of acute intracranial abnormality.  ASPECTS 10. Code stroke imaging results were communicated on 05/21/2024 at 8:42 pm to provider Dr. Vanessa via secure text paging. Electronically Signed   By: Gilmore GORMAN Molt M.D.   On: 05/21/2024 20:43     Procedures   Medications Ordered in the ED  sodium chloride  flush (NS) 0.9 % injection 3 mL (3 mLs Intravenous Not Given 05/21/24 2116)  LORazepam  (ATIVAN ) injection 2 mg (2 mg Intravenous Given 05/21/24 2059)  potassium chloride  SA (KLOR-CON  M) CR tablet 40 mEq (40 mEq Oral Given 05/22/24 0020)  LORazepam  (ATIVAN ) injection 2 mg (2 mg Intravenous Given 05/22/24 0030)                                    Medical Decision Making Amount and/or Complexity of Data Reviewed Labs: ordered. Radiology: ordered.  Risk Prescription drug management.   47 year old female with history of conversion disorder and functional neurologic symptoms,  myoclonus, hypertension, hyperlipidemia, and diabetes who presents to the emergency department with right-sided weakness and difficulty speaking  Initial Ddx:  Stroke, ICH, hypoglycemia, conversion disorder  MDM/Course:  Patient presents emergency department with right-sided weakness and difficulty speaking.  On exam does have right upper and right lower extremity weakness and some expressive aphasia.  Code stroke was activated prior to arrival.  Also evaluated by Dr. Vanessa from neurology.  She underwent a  CT of the head that did not show acute findings.  Emergent MRI was obtained which did not show evidence of stroke but does show what appears to be some changes in her left frontal lobe.  Will obtain an MRI with contrast to further evaluate.  Signed out to the oncoming physician awaiting MRI results.   This patient presents to the ED for concern of complaints listed in HPI, this involves an extensive number of treatment options, and is a complaint that carries with it a high risk of complications and morbidity. Disposition including potential need for admission considered.   Dispo: Pending remainder of workup  Additional history obtained from EMS Records reviewed Outpatient Clinic Notes The following labs were independently interpreted: Chemistry and show mild hypokalemia I independently reviewed the following imaging with scope of interpretation limited to determining acute life threatening conditions related to emergency care: CT Head and agree with the radiologist interpretation with the following exceptions: none I personally reviewed and interpreted cardiac monitoring: normal sinus rhythm  I personally reviewed and interpreted the pt's EKG: see above for interpretation  I have reviewed the patients home medications and made adjustments as needed Consults: Neurology  CRITICAL CARE Performed by: Lamar JAYSON Shan   Total critical care time: 30 minutes  Critical care time was  exclusive of separately billable procedures and treating other patients.  Critical care was necessary to treat or prevent imminent or life-threatening deterioration.  Critical care was time spent personally by me on the following activities: development of treatment plan with patient and/or surrogate as well as nursing, discussions with consultants, evaluation of patient's response to treatment, examination of patient, obtaining history from patient or surrogate, ordering and performing treatments and interventions, ordering and review of laboratory studies, ordering and review of radiographic studies, pulse oximetry and re-evaluation of patient's condition.   Portions of this note were generated with Scientist, clinical (histocompatibility and immunogenetics). Dictation errors may occur despite best attempts at proofreading.     Final diagnoses:  Right sided weakness  Expressive aphasia    ED Discharge Orders     None         Shan Lamar JAYSON, MD 05/22/24 0040

## 2024-05-21 NOTE — Code Documentation (Signed)
 Responded to Code Stroke called at 2005 for R sided weakness and aphasia, [id number]. Pt arrived at 2026, NIH-6, CT head negative for acute changes. Pt taken to MRI at 2041. 2mg  ativan  IV given in MRI d/t anxiety. MRI negative for stroke. Code Stroke cancelled at 2103.

## 2024-05-21 NOTE — ED Triage Notes (Signed)
 Pt coming in for code stroke. Pt having weakness on the right side and speech problems

## 2024-05-21 NOTE — Consult Note (Addendum)
 NEUROLOGY CONSULT NOTE   Date of service: May 21, 2024 Patient Name: Lynn Ponce MRN:  [id number] DOB:  Feb 06, 1976 Chief Complaint: code stroke for R sided weakness and Speech difficulty Requesting Provider: Yolande Lamar BROCKS, MD  History of Present Illness  Lynn Ponce is a 48 y.o. female with hx of Dm2, HTN, HLD, hypothyroidism, tremors, sleep apnea and unable to tolerate CPAP, obesity who presents with acute onset R sided weakness and stuttering speech. Was eating dinner with family when R arm went weak. EMS called, noted stuttering speech and weakness in her R arm and R leg. She was brought in as a code stroke. Recently stopped BP meds about 2 months ago. Has BL lower ext edema. Reports some numbness on the right side.  No prior hx of strokes. Exam with some giveaway weakenss but has several stroke risk factors.  LKW: 1930 Modified rankin score: 1-No significant post stroke disability and can perform usual duties with stroke symptoms IV Thrombolysis: not offered, no stroke on MRI EVT: not offered, no stroke on MRI  NIHSS components Score: Comment  1a Level of Conscious 0[x]  1[]  2[]  3[]      1b LOC Questions 0[x]  1[]  2[]       1c LOC Commands 0[x]  1[]  2[]       2 Best Gaze 0[x]  1[]  2[]       3 Visual 0[x]  1[]  2[]  3[]      4 Facial Palsy 0[x]  1[]  2[]  3[]      5a Motor Arm - left 0[x]  1[]  2[]  3[]  4[]  UN[]    5b Motor Arm - Right 0[]  1[x]  2[]  3[]  4[]  UN[]    6a Motor Leg - Left 0[x]  1[]  2[]  3[]  4[]  UN[]    6b Motor Leg - Right 0[]  1[]  2[x]  3[]  4[]  UN[]    7 Limb Ataxia 0[x]  1[]  2[]  UN[]      8 Sensory 0[]  1[x]  2[]  UN[]      9 Best Language 0[x]  1[]  2[]  3[]      10 Dysarthria 0[]  1[x]  2[]  UN[]      11 Extinct. and Inattention 0[x]  1[]  2[]       TOTAL: 5      ROS  Comprehensive ROS performed and pertinent positives documented in HPI   Past History   Past Medical History:  Diagnosis Date   Anxiety    Arthritis    Asthma    Cellulitis 08/2018   Right leg, hospitalized for a week     Depression    Diabetes mellitus without complication (HCC)    Gastric polyp    Gastritis    GERD (gastroesophageal reflux disease)    Hyperlipemia    Hypertension    Hypothyroidism    Neuromuscular disorder (HCC)    Obesity    Renal disorder    Sepsis (HCC) 08/2018   hospitalized in ICU    Sleep apnea     Past Surgical History:  Procedure Laterality Date   ADENOIDECTOMY     ANKLE SURGERY     x1   CHOLECYSTECTOMY     COLONOSCOPY  02/11/2002   Small internal hemorrhoids. Otherwise grossly normal colonoscopy to the mid transvere colon. It was stopped due to inadequate sedation   COLONOSCOPY WITH ESOPHAGOGASTRODUODENOSCOPY (EGD) AND ESOPHAGEAL DILATION (ED)  11/08/2022   ESOPHAGOGASTRODUODENOSCOPY  04/08/2013   Healed esophageal erosions. Small hiatal hernia. Mild gastritis.   FOOT SURGERY     x4   OTHER SURGICAL HISTORY     lap for ovaries x6   PICC LINE INSERTION  TONSILLECTOMY     UPPER GI ENDOSCOPY  2022   URETERAL STENT PLACEMENT     VAGINAL HYSTERECTOMY      Family History: Family History  Problem Relation Age of Onset   Osteoporosis Mother    Retinal detachment Sister    Neuromuscular disorder Sister        NMO- has tremor    Stomach cancer Maternal Grandmother    Diabetes Maternal Grandmother    Asthma Daughter    Asthma Son    Seizures Neg Hx    Migraines Neg Hx    Esophageal cancer Neg Hx    Colon cancer Neg Hx     Social History  reports that she has quit smoking. Her smoking use included cigarettes. She has never been exposed to tobacco smoke. She has never used smokeless tobacco. She reports that she does not drink alcohol and does not use drugs.  Allergies  Allergen Reactions   Sulfonamide Derivatives Anaphylaxis   Erythromycin Hives   Ibuprofen Other (See Comments)    Dark, tarry emesis and stool. Per pt vomiting blood   Mushroom Extract Complex (Obsolete) Hives   Toradol  [Ketorolac  Tromethamine ] Other (See Comments)    Dark tarry  emesis and stool Per pt vomiting blood   Varenicline     Other reaction(s): Mental Status Changes (intolerance) Suicidal ideation   Azithromycin Rash    Medications   Current Facility-Administered Medications:    sodium chloride  flush (NS) 0.9 % injection 3 mL, 3 mL, Intravenous, Once, Yolande Lamar BROCKS, MD  Current Outpatient Medications:    allopurinol  (ZYLOPRIM ) 300 MG tablet, TAKE 1 TABLET BY MOUTH  DAILY, Disp: 90 tablet, Rfl: 0   amitriptyline (ELAVIL) 50 MG tablet, Take 50 mg by mouth daily. , Disp: , Rfl:    atenolol (TENORMIN) 100 MG tablet, Take 100 mg by mouth daily. , Disp: , Rfl:    budesonide (PULMICORT) 0.5 MG/2ML nebulizer solution, Take 0.5 mg by nebulization daily. (Patient not taking: Reported on 06/27/2023), Disp: , Rfl:    Colchicine  0.6 MG CAPS, TAKE 1 CAPSULE BY MOUTH DAILY, MAY TAKE 1 CAPSULE BY MOUTH TWICE DAILY DURING FLARE., Disp: 90 capsule, Rfl: 0   Continuous Glucose Receiver (DEXCOM G7 RECEIVER) DEVI, 1 each by miscellaneous route daily., Disp: , Rfl:    Continuous Glucose Sensor (DEXCOM G7 SENSOR) MISC, Use 1 every 10 days. Use as directed, Disp: , Rfl:    dicyclomine (BENTYL) 10 MG capsule, Take 10 mg by mouth 4 (four) times daily -  before meals and at bedtime., Disp: , Rfl:    doxycycline  (VIBRA -TABS) 100 MG tablet, Take 1 tablet (100 mg total) by mouth 2 (two) times daily., Disp: 14 tablet, Rfl: 0   fluconazole  (DIFLUCAN ) 200 MG tablet, Take 1 tablet (200 mg total) by mouth daily. Take one tablet orally. If you develop signs of a yeast infection, take the second pill in 3 days., Disp: 2 tablet, Rfl: 0   fluticasone (FLONASE) 50 MCG/ACT nasal spray, daily., Disp: , Rfl:    gabapentin  (NEURONTIN ) 600 MG tablet, TAKE ONE TABLET BY MOUTH 4 TIMES DAILY, Disp: 120 tablet, Rfl: 2   hydrOXYzine (VISTARIL) 50 MG capsule, as needed. , Disp: , Rfl:    LANTUS SOLOSTAR 100 UNIT/ML Solostar Pen, Take 40 units twice daily, Disp: , Rfl:    LATUDA 40 MG TABS tablet,  Take 40 mg by mouth daily with breakfast. , Disp: , Rfl:    levothyroxine (SYNTHROID, LEVOTHROID) 300 MCG tablet, Take  250 mcg by mouth daily before breakfast., Disp: , Rfl:    magnesium 30 MG tablet, Take 30 mg by mouth 2 (two) times daily., Disp: , Rfl:    metFORMIN (GLUCOPHAGE) 1000 MG tablet, Take 1,000 mg by mouth daily with breakfast., Disp: , Rfl:    montelukast (SINGULAIR) 10 MG tablet, Take 10 mg by mouth at bedtime., Disp: , Rfl:    MOUNJARO 5 MG/0.5ML Pen, SMARTSIG:5 Milligram(s) SUB-Q Once a Week, Disp: , Rfl:    Multiple Vitamins-Minerals (CENTRUM WOMEN PO), Take 1 tablet by mouth daily., Disp: , Rfl:    omeprazole  (PRILOSEC) 40 MG capsule, TAKE 1 CAPSULE IN THE MORNING AND 1 CAPSULE AT BEDTIME, Disp: 180 capsule, Rfl: 1   ONE TOUCH ULTRA TEST test strip, , Disp: , Rfl:    ONETOUCH DELICA LANCETS 33G MISC, , Disp: , Rfl:    oxyCODONE -acetaminophen  (PERCOCET) 10-325 MG tablet, Take 1 tablet by mouth 4 (four) times daily. , Disp: , Rfl:    PARoxetine (PAXIL) 20 MG tablet, Take 40 mg by mouth daily., Disp: , Rfl:    promethazine  (PHENERGAN ) 25 MG tablet, TAKE 1 TABLET BY MOUTH EVERY 6 HOURS AS NEEDED FOR NAUSEA AND VOMITING, Disp: 15 tablet, Rfl: 0   promethazine -dextromethorphan (PROMETHAZINE -DM) 6.25-15 MG/5ML syrup, Take 5 mLs by mouth 4 (four) times daily as needed for cough., Disp: 118 mL, Rfl: 0   rizatriptan  (MAXALT ) 10 MG tablet, TAKE 1 TABLET BY MOUTH AS NEEDED FOR MIGRAINE. MAY REPEAT IN 2 HOURS. MAX OF 2 TABS IN 24 HOURS, Disp: 10 tablet, Rfl: 12   rosuvastatin (CRESTOR) 10 MG tablet, Take 10 mg by mouth daily., Disp: , Rfl:    topiramate  (TOPAMAX ) 50 MG tablet, TAKE 4 TABLETS BY MOUTH ONCE DAILY IN THE EVENING. Have primary care refill going forward., Disp: 120 tablet, Rfl: 0   triamcinolone  cream (KENALOG ) 0.1 %, SMARTSIG:1 Application Topical 2-3 Times Daily, Disp: , Rfl:    VENTOLIN HFA 108 (90 BASE) MCG/ACT inhaler, Inhale 2 puffs into the lungs every 6 (six) hours as  needed for shortness of breath. , Disp: , Rfl:   Vitals   Vitals:   05/21/24 2000  Weight: 123.4 kg    Body mass index is 41.36 kg/m.   Physical Exam   General: Laying comfortably in bed; in no acute distress.  HENT: Normal oropharynx and mucosa. Normal external appearance of ears and nose.  Neck: Supple, no pain or tenderness  CV: No JVD. No peripheral edema.  Pulmonary: Symmetric Chest rise. Normal respiratory effort.  Abdomen: Soft to touch, non-tender.  Ext: No cyanosis, edema, or deformity  Skin: No rash. Normal palpation of skin.   Musculoskeletal: Normal digits and nails by inspection. No clubbing.   Neurologic Examination  Mental status/Cognition: Alert, oriented to self, place, month and year, good attention.  Speech/language: slow, stuttering speech, non fluent, comprehension intact, object naming intact, repetition intact.  Cranial nerves:   CN II Pupils equal and reactive to light, no VF deficits    CN III,IV,VI EOM intact, no gaze preference or deviation, no nystagmus   CN V normal sensation in V1, V2, and V3 segments bilaterally    CN VII no asymmetry, no nasolabial fold flattening    CN VIII normal hearing to speech    CN IX & X normal palatal elevation, no uvular deviation    CN XI 5/5 head turn and 5/5 shoulder shrug bilaterally    CN XII midline tongue protrusion    Motor:  Muscle bulk: normal, tone normal Mvmt Root Nerve  Muscle Right Left Comments  SA C5/6 Ax Deltoid 3 5 Giveaway component to her weakness  EF C5/6 Mc Biceps 4 5   EE C6/7/8 Rad Triceps 4 5   WF C6/7 Med FCR     WE C7/8 PIN ECU     F Ab C8/T1 U ADM/FDI 4 5   HF L1/2/3 Fem Illopsoas 3 5   KE L2/3/4 Fem Quad     DF L4/5 D Peron Tib Ant 3 5   PF S1/2 Tibial Grc/Sol 3 5    Sensation:  Light touch Decreased in RUE and RLE.   Pin prick    Temperature    Vibration   Proprioception    Coordination/Complex Motor:  - Finger to Nose are intact BL - Heel to shin unable to do - Rapid  alternating movement are slowed on the right - Gait: deferred   Labs/Imaging/Neurodiagnostic studies   CBC: No results for input(s): WBC, NEUTROABS, HGB, HCT, MCV, PLT in the last 168 hours. Basic Metabolic Panel:  Lab Results  Component Value Date   NA 140 04/07/2024   K 3.6 04/07/2024   CO2 23 04/07/2024   GLUCOSE 179 (H) 04/07/2024   BUN 23 (H) 04/07/2024   CREATININE 1.47 (H) 04/07/2024   CALCIUM 8.9 04/07/2024   GFRNONAA 43 (L) 04/07/2024   GFRAA 62 01/18/2021   Lipid Panel:  Lab Results  Component Value Date   LDLCALC 71 06/21/2021   HgbA1c:  Lab Results  Component Value Date   HGBA1C 7.0 (H) 06/21/2021   Urine Drug Screen:     Component Value Date/Time   LABOPIA NONE DETECTED 12/03/2009 1954   COCAINSCRNUR NONE DETECTED 12/03/2009 1954   LABBENZ POSITIVE (A) 12/03/2009 1954   AMPHETMU NONE DETECTED 12/03/2009 1954   THCU NONE DETECTED 12/03/2009 1954   LABBARB  12/03/2009 1954    NONE DETECTED        DRUG SCREEN FOR MEDICAL PURPOSES ONLY.  IF CONFIRMATION IS NEEDED FOR ANY PURPOSE, NOTIFY LAB WITHIN 5 DAYS.        LOWEST DETECTABLE LIMITS FOR URINE DRUG SCREEN Drug Class       Cutoff (ng/mL) Amphetamine      1000 Barbiturate      200 Benzodiazepine   200 Tricyclics       300 Opiates          300 Cocaine          300 THC              50    Alcohol Level No results found for: ETH INR No results found for: INR APTT No results found for: APTT AED levels: No results found for: PHENYTOIN, ZONISAMIDE, LAMOTRIGINE, LEVETIRACETA  CT Head without contrast(Personally reviewed): CTH was negative for a large hypodensity concerning for a large territory infarct or hyperdensity concerning for an ICH  MRI Brain(Personally reviewed): No acute stroke  ASSESSMENT   Lynn Ponce is a 48 y.o. female with stroke risk factors p/w R sided weakness and stuttering speech and bradyphrenia. Exam with giveaway weakness and concern for  weakness being functional in nature.  She was taken for STAT MRI brain instead of offering her tnkase. MRI Brain was negative for stroke.  RECOMMENDATIONS  - No further inpatient neurologic workup recommended. 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 - we will signoff. Okay to discharge from  neuro standpoint. - if deficits do no spontaneously resolve, will need PT and OT eval.  ______________________________________________________________________  Plan discussed with patient at the bedside in detail and with Dr. Yolande with the ED team in person.  Signed, Alik Mawson, MD [facility]

## 2024-05-21 NOTE — ED Notes (Signed)
 [phone] Clarita Pt mom called for a update

## 2024-05-21 NOTE — ED Notes (Signed)
Patient is resting comfortably in NAD at this time.

## 2024-05-22 ENCOUNTER — Emergency Department (HOSPITAL_COMMUNITY)

## 2024-05-22 DIAGNOSIS — R531 Weakness: Secondary | ICD-10-CM | POA: Diagnosis not present

## 2024-05-22 MED ORDER — LORAZEPAM 2 MG/ML IJ SOLN
2.0000 mg | Freq: Once | INTRAMUSCULAR | Status: AC
  Administered 2024-05-22: 2 mg via INTRAVENOUS
  Filled 2024-05-22: qty 1

## 2024-05-22 MED ORDER — GADOBUTROL 1 MMOL/ML IV SOLN
10.0000 mL | Freq: Once | INTRAVENOUS | Status: AC | PRN
  Administered 2024-05-22: 10 mL via INTRAVENOUS

## 2024-05-22 MED ORDER — POTASSIUM CHLORIDE CRYS ER 20 MEQ PO TBCR
40.0000 meq | EXTENDED_RELEASE_TABLET | Freq: Once | ORAL | Status: AC
  Administered 2024-05-22: 40 meq via ORAL
  Filled 2024-05-22: qty 2

## 2024-05-22 NOTE — ED Notes (Signed)
 Patient back from MRI.

## 2024-05-22 NOTE — ED Notes (Signed)
 Patient is resting comfortably.

## 2024-05-22 NOTE — ED Provider Notes (Signed)
  Provider Note MRN:  [id number]  Arrival date & time: 05/22/24    ED Course and Medical Decision Making  Assumed care of patient at sign-out or upon transfer.  History of conversion disorder presenting with right-sided weakness.  MRI without showing a subtle abnormality, neurology recommending MRI with contrast.  MRI with contrast overall reassuring.  Per neurology appropriate for discharge with outpatient follow-up.  Procedures  Final Clinical Impressions(s) / ED Diagnoses     ICD-10-CM   1. Right sided weakness  R53.1     2. Expressive aphasia  R47.01       ED Discharge Orders     None         Discharge Instructions      You were seen for your weakness and difficulty speaking in the emergency department.   Check your MyChart online for the results of any tests that had not resulted by the time you left the emergency department.   Follow-up with your primary doctor in 2-3 days regarding your visit.  Follow-up with your neurologist as possible.  Return immediately to the emergency department if you experience any of the following: Difficulty speaking, facial droop, vision changes, weakness, or any other concerning symptoms.    Thank you for visiting our Emergency Department. It was a pleasure taking care of you today.      Ozell HERO. Theadore, MD [facility] Emergency Medicine [facility] [email or web address]    Theadore Ozell HERO, MD 05/22/24 [phone]

## 2024-05-22 NOTE — Discharge Instructions (Signed)
 You were seen for your weakness and difficulty speaking in the emergency department.   Check your MyChart online for the results of any tests that had not resulted by the time you left the emergency department.   Follow-up with your primary doctor in 2-3 days regarding your visit.  Follow-up with your neurologist as possible.  Return immediately to the emergency department if you experience any of the following: Difficulty speaking, facial droop, vision changes, weakness, or any other concerning symptoms.    Thank you for visiting our Emergency Department. It was a pleasure taking care of you today.

## 2024-05-22 NOTE — ED Notes (Signed)
Pt discharged. Pt given discharge papers and papers explained. Pt in NAD at this time

## 2024-08-05 NOTE — Progress Notes (Deleted)
 08/05/2024 ALEXSUS PAPADOPOULOS [id number] 10/21/1976  Referring provider: Norval Kettle, MD Primary GI doctor: Dr. Charlanne  ASSESSMENT AND PLAN:  GERD with dysphagia Status post cholecystectomy 11/08/2022 EGD mild gastritis biopsied status post empiric dilation Path negative for H. pylori, reflux esophagitis negative for EOE  IBS with diarrhea  Screening colonoscopy 11/08/2022 colonoscopy good prep for positive Cologuard 2 TA polyps 3 to 4 mm transverse colon nonbleeding internal hemorrhoids normal TI Recall 5 years  OSA  Lupus like autoimmune disorder  Hepatic steatosis seen on CT renal stone 04/2017  PTSD Difficult to sedate in the past with need for moderate sedation  Patient Care Team: Norval Kettle, MD as PCP - General (Internal Medicine)  HISTORY OF PRESENT ILLNESS: 48 y.o. female with a past medical history listed below presents for evaluation of ***.   Last seen in the office 10/05/2022 by Jessica Zier for dysphagia.  *** Discussed the use of AI scribe software for clinical note transcription with the patient, who gave verbal consent to proceed.  History of Present Illness            She  reports that she has quit smoking. Her smoking use included cigarettes. She has never been exposed to tobacco smoke. She has never used smokeless tobacco. She reports that she does not drink alcohol and does not use drugs.  RELEVANT GI HISTORY, IMAGING AND LABS: Results         EGD 12/2020:   -Few circular furrows in the proximal and midesophagus (biopsied to r/o EoE).  Was dilated. -Mild gastritis. -Incidental gastric polyps. (Resected and retrieved x 2)   1. Surgical [P], duodenal bx - BENIGN DUODENAL MUCOSA - NO ACUTE INFLAMMATION, VILLOUS BLUNTING OR INCREASED INTRAEPITHELIAL LYMPHOCYTES IDENTIFIED 2. Surgical [P], gastric antrum and gastric body - REACTIVE GASTROPATHY - NO H. PYLORI OR INTESTINAL METAPLASIA IDENTIFIED - SEE COMMENT 3. Surgical [P], gastric polyp  bx (2) - BENIGN GASTRIC MUCOSA WITH FEATURES CONSISTENT WITH PROTON PUMP INHIBITOR EFFECT - NO H. PYLORI, INTESTINAL METAPLASIA OR MALIGNANCY IDENTIFIED 4. Surgical [P], distal esophagus - SQUAMOCOLUMNAR JUNCTION WITH CHRONIC INFLAMMATION - NO INTESTINAL METAPLASIA, DYSPLASIA OR MALIGNANCY IDENTIFIED 5. Surgical [P], esophageal - REACTIVE SQUAMOUS MUCOSA WITH INCREASED INTRAEPITHELIAL LYMPHOCYTES AND RARE EOSINOPHILS - SEE COMMENT CBC    Component Value Date/Time   WBC 9.4 05/21/2024 2230   RBC 3.61 (L) 05/21/2024 2230   HGB 10.2 (L) 05/21/2024 2307   HGB 14.5 08/03/2020 1341   HCT 30.0 (L) 05/21/2024 2307   HCT 41.8 08/03/2020 1341   PLT 178 05/21/2024 2230   PLT 192 08/03/2020 1341   MCV 93.4 05/21/2024 2230   MCV 89 08/03/2020 1341   MCH 29.1 05/21/2024 2230   MCHC 31.2 05/21/2024 2230   RDW 15.2 05/21/2024 2230   RDW 13.9 08/03/2020 1341   LYMPHSABS 2.9 05/21/2024 2230   MONOABS 0.7 05/21/2024 2230   EOSABS 0.3 05/21/2024 2230   BASOSABS 0.1 05/21/2024 2230   Recent Labs    04/07/24 1207 05/21/24 2230 05/21/24 2307  HGB 12.9 10.5* 10.2*    CMP     Component Value Date/Time   NA 144 05/21/2024 2307   NA 141 08/03/2020 1341   K 3.2 (L) 05/21/2024 2307   CL 111 05/21/2024 2307   CO2 19 (L) 05/21/2024 2230   GLUCOSE 99 05/21/2024 2307   BUN 17 05/21/2024 2307   BUN 15 08/03/2020 1341   CREATININE 1.30 (H) 05/21/2024 2307   CREATININE 1.35 (H) 06/27/2023 1025  CALCIUM 7.6 (L) 05/21/2024 2230   PROT 5.1 (L) 05/21/2024 2230   PROT 7.2 08/03/2020 1341   ALBUMIN 2.9 (L) 05/21/2024 2230   ALBUMIN 4.7 08/03/2020 1341   AST 20 05/21/2024 2230   ALT 18 05/21/2024 2230   ALKPHOS 54 05/21/2024 2230   BILITOT 0.3 05/21/2024 2230   BILITOT 0.3 08/03/2020 1341   GFRNONAA 52 (L) 05/21/2024 2230   GFRNONAA 53 (L) 01/18/2021 1003   GFRAA 62 01/18/2021 1003      Latest Ref Rng & Units 05/21/2024   10:30 PM 06/27/2023   10:25 AM 06/21/2021   11:23 AM  Hepatic Function   Total Protein 6.5 - 8.1 g/dL 5.1  7.6  7.0   Albumin 3.5 - 5.0 g/dL 2.9     AST 15 - 41 U/L 20  42  19   ALT 0 - 44 U/L 18  28  14    Alk Phosphatase 38 - 126 U/L 54     Total Bilirubin 0.0 - 1.2 mg/dL 0.3  0.3  0.4       Current Medications:   Current Outpatient Medications (Endocrine & Metabolic):    LANTUS SOLOSTAR 100 UNIT/ML Solostar Pen, Take 40 units twice daily   levothyroxine (SYNTHROID, LEVOTHROID) 300 MCG tablet, Take 250 mcg by mouth daily before breakfast.   metFORMIN (GLUCOPHAGE) 1000 MG tablet, Take 1,000 mg by mouth daily with breakfast.   MOUNJARO 5 MG/0.5ML Pen, SMARTSIG:5 Milligram(s) SUB-Q Once a Week  Current Outpatient Medications (Cardiovascular):    atenolol (TENORMIN) 100 MG tablet, Take 100 mg by mouth daily.    rosuvastatin (CRESTOR) 10 MG tablet, Take 10 mg by mouth daily.  Current Outpatient Medications (Respiratory):    budesonide (PULMICORT) 0.5 MG/2ML nebulizer solution, Take 0.5 mg by nebulization daily. (Patient not taking: Reported on 06/27/2023)   fluticasone (FLONASE) 50 MCG/ACT nasal spray, daily.   montelukast (SINGULAIR) 10 MG tablet, Take 10 mg by mouth at bedtime.   promethazine  (PHENERGAN ) 25 MG tablet, TAKE 1 TABLET BY MOUTH EVERY 6 HOURS AS NEEDED FOR NAUSEA AND VOMITING   promethazine -dextromethorphan (PROMETHAZINE -DM) 6.25-15 MG/5ML syrup, Take 5 mLs by mouth 4 (four) times daily as needed for cough.   VENTOLIN HFA 108 (90 BASE) MCG/ACT inhaler, Inhale 2 puffs into the lungs every 6 (six) hours as needed for shortness of breath.   Current Outpatient Medications (Analgesics):    allopurinol  (ZYLOPRIM ) 300 MG tablet, TAKE 1 TABLET BY MOUTH  DAILY   Colchicine  0.6 MG CAPS, TAKE 1 CAPSULE BY MOUTH DAILY, MAY TAKE 1 CAPSULE BY MOUTH TWICE DAILY DURING FLARE.   oxyCODONE -acetaminophen  (PERCOCET) 10-325 MG tablet, Take 1 tablet by mouth 4 (four) times daily.    rizatriptan  (MAXALT ) 10 MG tablet, TAKE 1 TABLET BY MOUTH AS NEEDED FOR MIGRAINE. MAY  REPEAT IN 2 HOURS. MAX OF 2 TABS IN 24 HOURS   Current Outpatient Medications (Other):    amitriptyline (ELAVIL) 50 MG tablet, Take 50 mg by mouth daily.    Continuous Glucose Receiver (DEXCOM G7 RECEIVER) DEVI, 1 each by miscellaneous route daily.   Continuous Glucose Sensor (DEXCOM G7 SENSOR) MISC, Use 1 every 10 days. Use as directed   dicyclomine (BENTYL) 10 MG capsule, Take 10 mg by mouth 4 (four) times daily -  before meals and at bedtime.   doxycycline  (VIBRA -TABS) 100 MG tablet, Take 1 tablet (100 mg total) by mouth 2 (two) times daily.   fluconazole  (DIFLUCAN ) 200 MG tablet, Take 1 tablet (200 mg total) by mouth  daily. Take one tablet orally. If you develop signs of a yeast infection, take the second pill in 3 days.   gabapentin  (NEURONTIN ) 600 MG tablet, TAKE ONE TABLET BY MOUTH 4 TIMES DAILY   hydrOXYzine (VISTARIL) 50 MG capsule, as needed.    LATUDA 40 MG TABS tablet, Take 40 mg by mouth daily with breakfast.    magnesium 30 MG tablet, Take 30 mg by mouth 2 (two) times daily.   Multiple Vitamins-Minerals (CENTRUM WOMEN PO), Take 1 tablet by mouth daily.   omeprazole  (PRILOSEC) 40 MG capsule, TAKE 1 CAPSULE IN THE MORNING AND 1 CAPSULE AT BEDTIME   ONE TOUCH ULTRA TEST test strip,    ONETOUCH DELICA LANCETS 33G MISC,    PARoxetine (PAXIL) 20 MG tablet, Take 40 mg by mouth daily.   topiramate  (TOPAMAX ) 50 MG tablet, TAKE 4 TABLETS BY MOUTH ONCE DAILY IN THE EVENING. Have primary care refill going forward.   triamcinolone  cream (KENALOG ) 0.1 %, SMARTSIG:1 Application Topical 2-3 Times Daily  Medical History:  Past Medical History:  Diagnosis Date   Anxiety    Arthritis    Asthma    Cellulitis 08/2018   Right leg, hospitalized for a week    Depression    Diabetes mellitus without complication (HCC)    Gastric polyp    Gastritis    GERD (gastroesophageal reflux disease)    Hyperlipemia    Hypertension    Hypothyroidism    Neuromuscular disorder (HCC)    Obesity     Renal disorder    Sepsis (HCC) 08/2018   hospitalized in ICU    Sleep apnea    Allergies:  Allergies  Allergen Reactions   Sulfonamide Derivatives Anaphylaxis   Erythromycin Hives   Ibuprofen Other (See Comments)    Dark, tarry emesis and stool. Per pt vomiting blood   Mushroom Extract Complex (Obsolete) Hives   Toradol  [Ketorolac  Tromethamine ] Other (See Comments)    Dark tarry emesis and stool Per pt vomiting blood   Varenicline     Other reaction(s): Mental Status Changes (intolerance) Suicidal ideation   Azithromycin Rash     Surgical History:  She  has a past surgical history that includes Vaginal hysterectomy; Other surgical history; Cholecystectomy; Tonsillectomy; Adenoidectomy; Foot surgery; Ankle surgery; Upper gi endoscopy (2022); Esophagogastroduodenoscopy (04/08/2013); Colonoscopy (02/11/2002); Ureteral stent placement; PICC LINE INSERTION; and Colonoscopy with esophagogastroduodenoscopy (egd) and esophageal dilation (ed) (11/08/2022). Family History:  Her family history includes Asthma in her daughter and son; Diabetes in her maternal grandmother; Neuromuscular disorder in her sister; Osteoporosis in her mother; Retinal detachment in her sister; Stomach cancer in her maternal grandmother.  REVIEW OF SYSTEMS  : All other systems reviewed and negative except where noted in the History of Present Illness.  PHYSICAL EXAM: There were no vitals taken for this visit. Physical Exam          Alan JONELLE Coombs, PA-C 8:12 AM

## 2024-08-06 ENCOUNTER — Ambulatory Visit: Admitting: Physician Assistant

## 2024-09-11 ENCOUNTER — Ambulatory Visit (INDEPENDENT_AMBULATORY_CARE_PROVIDER_SITE_OTHER): Admitting: Podiatry

## 2024-09-11 DIAGNOSIS — L97512 Non-pressure chronic ulcer of other part of right foot with fat layer exposed: Secondary | ICD-10-CM

## 2024-09-11 NOTE — Progress Notes (Signed)
 Subjective:  Patient ID: Lynn Ponce, female    DOB: 1976-01-07,  MRN: [id number]  Chief Complaint  Patient presents with   Foot Ulcer    48 y.o. female presents with the above complaint.  Patient presents with right submetatarsal 1 ulceration that has been present for quite some time she has been taking care of it wanted to get it evaluated she is a diabetic.  She has not seen anyone as prior to seeing me for this.  Denies any other acute complaints.  Would like to discuss treatment options for this.   Review of Systems: Negative except as noted in the HPI. Denies N/V/F/Ch.  Past Medical History:  Diagnosis Date   Anxiety    Arthritis    Asthma    Cellulitis 08/2018   Right leg, hospitalized for a week    Depression    Diabetes mellitus without complication (HCC)    Gastric polyp    Gastritis    GERD (gastroesophageal reflux disease)    Hyperlipemia    Hypertension    Hypothyroidism    Neuromuscular disorder (HCC)    Obesity    Renal disorder    Sepsis (HCC) 08/2018   hospitalized in ICU    Sleep apnea     Current Outpatient Medications:    allopurinol  (ZYLOPRIM ) 300 MG tablet, TAKE 1 TABLET BY MOUTH  DAILY, Disp: 90 tablet, Rfl: 0   amitriptyline (ELAVIL) 50 MG tablet, Take 50 mg by mouth daily. , Disp: , Rfl:    atenolol (TENORMIN) 100 MG tablet, Take 100 mg by mouth daily. , Disp: , Rfl:    budesonide (PULMICORT) 0.5 MG/2ML nebulizer solution, Take 0.5 mg by nebulization daily. (Patient not taking: Reported on 06/27/2023), Disp: , Rfl:    Colchicine  0.6 MG CAPS, TAKE 1 CAPSULE BY MOUTH DAILY, MAY TAKE 1 CAPSULE BY MOUTH TWICE DAILY DURING FLARE., Disp: 90 capsule, Rfl: 0   Continuous Glucose Receiver (DEXCOM G7 RECEIVER) DEVI, 1 each by miscellaneous route daily., Disp: , Rfl:    Continuous Glucose Sensor (DEXCOM G7 SENSOR) MISC, Use 1 every 10 days. Use as directed, Disp: , Rfl:    dicyclomine (BENTYL) 10 MG capsule, Take 10 mg by mouth 4 (four) times daily -  before  meals and at bedtime., Disp: , Rfl:    doxycycline  (VIBRA -TABS) 100 MG tablet, Take 1 tablet (100 mg total) by mouth 2 (two) times daily., Disp: 14 tablet, Rfl: 0   fluconazole  (DIFLUCAN ) 200 MG tablet, Take 1 tablet (200 mg total) by mouth daily. Take one tablet orally. If you develop signs of a yeast infection, take the second pill in 3 days., Disp: 2 tablet, Rfl: 0   fluticasone (FLONASE) 50 MCG/ACT nasal spray, daily., Disp: , Rfl:    gabapentin  (NEURONTIN ) 600 MG tablet, TAKE ONE TABLET BY MOUTH 4 TIMES DAILY, Disp: 120 tablet, Rfl: 2   hydrOXYzine (VISTARIL) 50 MG capsule, as needed. , Disp: , Rfl:    LANTUS SOLOSTAR 100 UNIT/ML Solostar Pen, Take 40 units twice daily, Disp: , Rfl:    LATUDA 40 MG TABS tablet, Take 40 mg by mouth daily with breakfast. , Disp: , Rfl:    levothyroxine (SYNTHROID, LEVOTHROID) 300 MCG tablet, Take 250 mcg by mouth daily before breakfast., Disp: , Rfl:    magnesium 30 MG tablet, Take 30 mg by mouth 2 (two) times daily., Disp: , Rfl:    metFORMIN (GLUCOPHAGE) 1000 MG tablet, Take 1,000 mg by mouth daily with breakfast., Disp: ,  Rfl:    montelukast (SINGULAIR) 10 MG tablet, Take 10 mg by mouth at bedtime., Disp: , Rfl:    MOUNJARO 5 MG/0.5ML Pen, SMARTSIG:5 Milligram(s) SUB-Q Once a Week, Disp: , Rfl:    Multiple Vitamins-Minerals (CENTRUM WOMEN PO), Take 1 tablet by mouth daily., Disp: , Rfl:    omeprazole  (PRILOSEC) 40 MG capsule, TAKE 1 CAPSULE IN THE MORNING AND 1 CAPSULE AT BEDTIME, Disp: 180 capsule, Rfl: 1   ONE TOUCH ULTRA TEST test strip, , Disp: , Rfl:    ONETOUCH DELICA LANCETS 33G MISC, , Disp: , Rfl:    oxyCODONE -acetaminophen  (PERCOCET) 10-325 MG tablet, Take 1 tablet by mouth 4 (four) times daily. , Disp: , Rfl:    PARoxetine (PAXIL) 20 MG tablet, Take 40 mg by mouth daily., Disp: , Rfl:    promethazine  (PHENERGAN ) 25 MG tablet, TAKE 1 TABLET BY MOUTH EVERY 6 HOURS AS NEEDED FOR NAUSEA AND VOMITING, Disp: 15 tablet, Rfl: 0    promethazine -dextromethorphan (PROMETHAZINE -DM) 6.25-15 MG/5ML syrup, Take 5 mLs by mouth 4 (four) times daily as needed for cough., Disp: 118 mL, Rfl: 0   rizatriptan  (MAXALT ) 10 MG tablet, TAKE 1 TABLET BY MOUTH AS NEEDED FOR MIGRAINE. MAY REPEAT IN 2 HOURS. MAX OF 2 TABS IN 24 HOURS, Disp: 10 tablet, Rfl: 12   rosuvastatin (CRESTOR) 10 MG tablet, Take 10 mg by mouth daily., Disp: , Rfl:    topiramate  (TOPAMAX ) 50 MG tablet, TAKE 4 TABLETS BY MOUTH ONCE DAILY IN THE EVENING. Have primary care refill going forward., Disp: 120 tablet, Rfl: 0   triamcinolone  cream (KENALOG ) 0.1 %, SMARTSIG:1 Application Topical 2-3 Times Daily, Disp: , Rfl:    VENTOLIN HFA 108 (90 BASE) MCG/ACT inhaler, Inhale 2 puffs into the lungs every 6 (six) hours as needed for shortness of breath. , Disp: , Rfl:   Social History   Tobacco Use  Smoking Status Former   Types: Cigarettes   Passive exposure: Never  Smokeless Tobacco Never    Allergies  Allergen Reactions   Sulfonamide Derivatives Anaphylaxis   Erythromycin Hives   Ibuprofen Other (See Comments)    Dark, tarry emesis and stool. Per pt vomiting blood   Mushroom Extract Complex (Obsolete) Hives   Toradol  [Ketorolac  Tromethamine ] Other (See Comments)    Dark tarry emesis and stool Per pt vomiting blood   Varenicline     Other reaction(s): Mental Status Changes (intolerance) Suicidal ideation   Azithromycin Rash   Objective:  There were no vitals filed for this visit. There is no height or weight on file to calculate BMI. Constitutional Well developed. Well nourished.  Vascular Dorsalis pedis pulses palpable bilaterally. Posterior tibial pulses palpable bilaterally. Capillary refill normal to all digits.  No cyanosis or clubbing noted. Pedal hair growth normal.  Neurologic Normal speech. Oriented to person, place, and time. Epicritic sensation to light touch grossly present bilaterally.  Dermatologic Right submetatarsal 1 ulceration with  fat layer exposed.  Does not probe down to deep tissue.  No malodor present no cellulitis or redness noted.  Stable overall  Orthopedic: Normal joint ROM without pain or crepitus bilaterally. No visible deformities. No bony tenderness.   Radiographs: None Assessment:   1. Right foot ulcer, with fat layer exposed (HCC)    Plan:  Patient was evaluated and treated and all questions answered.  Right submetatarsal 1 ulceration with fat layer exposed stable - All questions and concerns were discussed with the patient in extensive detail - Given the presence of ulceration  she would benefit from Betadine wet-to-dry dressing changes daily I encouraged her to do it daily she states understanding will do so immediately - I discussed shoe gear modification as well she states understanding surgical shoe was dispensed.  No follow-ups on file.

## 2024-09-13 ENCOUNTER — Encounter: Payer: Self-pay | Admitting: Podiatry

## 2024-10-07 ENCOUNTER — Ambulatory Visit: Admitting: Podiatry

## 2024-10-07 DIAGNOSIS — L97512 Non-pressure chronic ulcer of other part of right foot with fat layer exposed: Secondary | ICD-10-CM

## 2024-10-07 NOTE — Progress Notes (Signed)
 Subjective:  Patient ID: Lynn Ponce, female    DOB: [birth year],  MRN: [id number]  Chief Complaint  Patient presents with   Foot Ulcer    Right foot ulcer follow up     48 y.o. female presents with the above complaint.  Patient presents for follow-up of right submetatarsal 1 ulceration she states she is doing better.  Not as much drainage.  She has been doing iodine dressings.  Denies any other acute complaints   Review of Systems: Negative except as noted in the HPI. Denies N/V/F/Ch.  Past Medical History:  Diagnosis Date   Anxiety    Arthritis    Asthma    Cellulitis 08/2018   Right leg, hospitalized for a week    Depression    Diabetes mellitus without complication (HCC)    Gastric polyp    Gastritis    GERD (gastroesophageal reflux disease)    Hyperlipemia    Hypertension    Hypothyroidism    Neuromuscular disorder (HCC)    Obesity    Renal disorder    Sepsis (HCC) 08/2018   hospitalized in ICU    Sleep apnea     Current Outpatient Medications:    allopurinol  (ZYLOPRIM ) 300 MG tablet, TAKE 1 TABLET BY MOUTH  DAILY, Disp: 90 tablet, Rfl: 0   amitriptyline (ELAVIL) 50 MG tablet, Take 50 mg by mouth daily. , Disp: , Rfl:    atenolol (TENORMIN) 100 MG tablet, Take 100 mg by mouth daily. , Disp: , Rfl:    budesonide (PULMICORT) 0.5 MG/2ML nebulizer solution, Take 0.5 mg by nebulization daily. (Patient not taking: Reported on 06/27/2023), Disp: , Rfl:    Colchicine  0.6 MG CAPS, TAKE 1 CAPSULE BY MOUTH DAILY, MAY TAKE 1 CAPSULE BY MOUTH TWICE DAILY DURING FLARE., Disp: 90 capsule, Rfl: 0   Continuous Glucose Receiver (DEXCOM G7 RECEIVER) DEVI, 1 each by miscellaneous route daily., Disp: , Rfl:    Continuous Glucose Sensor (DEXCOM G7 SENSOR) MISC, Use 1 every 10 days. Use as directed, Disp: , Rfl:    dicyclomine (BENTYL) 10 MG capsule, Take 10 mg by mouth 4 (four) times daily -  before meals and at bedtime., Disp: , Rfl:    doxycycline  (VIBRA -TABS) 100 MG tablet, Take 1  tablet (100 mg total) by mouth 2 (two) times daily., Disp: 14 tablet, Rfl: 0   fluconazole  (DIFLUCAN ) 200 MG tablet, Take 1 tablet (200 mg total) by mouth daily. Take one tablet orally. If you develop signs of a yeast infection, take the second pill in 3 days., Disp: 2 tablet, Rfl: 0   fluticasone (FLONASE) 50 MCG/ACT nasal spray, daily., Disp: , Rfl:    gabapentin  (NEURONTIN ) 600 MG tablet, TAKE ONE TABLET BY MOUTH 4 TIMES DAILY, Disp: 120 tablet, Rfl: 2   hydrOXYzine (VISTARIL) 50 MG capsule, as needed. , Disp: , Rfl:    LANTUS SOLOSTAR 100 UNIT/ML Solostar Pen, Take 40 units twice daily, Disp: , Rfl:    LATUDA 40 MG TABS tablet, Take 40 mg by mouth daily with breakfast. , Disp: , Rfl:    levothyroxine (SYNTHROID, LEVOTHROID) 300 MCG tablet, Take 250 mcg by mouth daily before breakfast., Disp: , Rfl:    magnesium 30 MG tablet, Take 30 mg by mouth 2 (two) times daily., Disp: , Rfl:    metFORMIN (GLUCOPHAGE) 1000 MG tablet, Take 1,000 mg by mouth daily with breakfast., Disp: , Rfl:    montelukast (SINGULAIR) 10 MG tablet, Take 10 mg by mouth at bedtime., Disp: ,  Rfl:    MOUNJARO 5 MG/0.5ML Pen, SMARTSIG:5 Milligram(s) SUB-Q Once a Week, Disp: , Rfl:    Multiple Vitamins-Minerals (CENTRUM WOMEN PO), Take 1 tablet by mouth daily., Disp: , Rfl:    omeprazole  (PRILOSEC) 40 MG capsule, TAKE 1 CAPSULE IN THE MORNING AND 1 CAPSULE AT BEDTIME, Disp: 180 capsule, Rfl: 1   ONE TOUCH ULTRA TEST test strip, , Disp: , Rfl:    ONETOUCH DELICA LANCETS 33G MISC, , Disp: , Rfl:    oxyCODONE -acetaminophen  (PERCOCET) 10-325 MG tablet, Take 1 tablet by mouth 4 (four) times daily. , Disp: , Rfl:    PARoxetine (PAXIL) 20 MG tablet, Take 40 mg by mouth daily., Disp: , Rfl:    promethazine  (PHENERGAN ) 25 MG tablet, TAKE 1 TABLET BY MOUTH EVERY 6 HOURS AS NEEDED FOR NAUSEA AND VOMITING, Disp: 15 tablet, Rfl: 0   promethazine -dextromethorphan (PROMETHAZINE -DM) 6.25-15 MG/5ML syrup, Take 5 mLs by mouth 4 (four) times daily  as needed for cough., Disp: 118 mL, Rfl: 0   rizatriptan  (MAXALT ) 10 MG tablet, TAKE 1 TABLET BY MOUTH AS NEEDED FOR MIGRAINE. MAY REPEAT IN 2 HOURS. MAX OF 2 TABS IN 24 HOURS, Disp: 10 tablet, Rfl: 12   rosuvastatin (CRESTOR) 10 MG tablet, Take 10 mg by mouth daily., Disp: , Rfl:    topiramate  (TOPAMAX ) 50 MG tablet, TAKE 4 TABLETS BY MOUTH ONCE DAILY IN THE EVENING. Have primary care refill going forward., Disp: 120 tablet, Rfl: 0   triamcinolone  cream (KENALOG ) 0.1 %, SMARTSIG:1 Application Topical 2-3 Times Daily, Disp: , Rfl:    VENTOLIN HFA 108 (90 BASE) MCG/ACT inhaler, Inhale 2 puffs into the lungs every 6 (six) hours as needed for shortness of breath. , Disp: , Rfl:   Social History   Tobacco Use  Smoking Status Former   Types: Cigarettes   Passive exposure: Never  Smokeless Tobacco Never    Allergies  Allergen Reactions   Sulfonamide Derivatives Anaphylaxis   Erythromycin Hives   Ibuprofen Other (See Comments)    Dark, tarry emesis and stool. Per pt vomiting blood   Mushroom Extract Complex (Obsolete) Hives   Toradol  [Ketorolac  Tromethamine ] Other (See Comments)    Dark tarry emesis and stool Per pt vomiting blood   Varenicline     Other reaction(s): Mental Status Changes (intolerance) Suicidal ideation   Azithromycin Rash   Objective:  There were no vitals filed for this visit. There is no height or weight on file to calculate BMI. Constitutional Well developed. Well nourished.  Vascular Dorsalis pedis pulses palpable bilaterally. Posterior tibial pulses palpable bilaterally. Capillary refill normal to all digits.  No cyanosis or clubbing noted. Pedal hair growth normal.  Neurologic Normal speech. Oriented to person, place, and time. Epicritic sensation to light touch grossly present bilaterally.  Dermatologic Right submetatarsal 1 ulceration with fat layer exposed.  Does not probe down to deep tissue.  No malodor present no cellulitis or redness noted.   Stable overall  Orthopedic: Normal joint ROM without pain or crepitus bilaterally. No visible deformities. No bony tenderness.   Radiographs: None Assessment:   No diagnosis found.  Plan:  Patient was evaluated and treated and all questions answered.  Right submetatarsal 1 ulceration with fat layer exposed stable~clinically improving - All questions and concerns were discussed with the patient in extensive detail - Continue Betadine wet-to-dry dressing continue wearing surgical shoe.  It has reduced in size. - I discussed shoe gear modification as well she states understanding surgical shoe was dispensed.  No follow-ups on file.

## 2024-11-06 ENCOUNTER — Ambulatory Visit: Admitting: Podiatry

## 2024-11-06 DIAGNOSIS — Q666 Other congenital valgus deformities of feet: Secondary | ICD-10-CM

## 2024-11-06 DIAGNOSIS — L97512 Non-pressure chronic ulcer of other part of right foot with fat layer exposed: Secondary | ICD-10-CM | POA: Diagnosis not present

## 2024-11-06 NOTE — Progress Notes (Unsigned)
 Right submetatarsal 1 ulcer healed  Pes planovalgus orthotics

## 2024-12-04 ENCOUNTER — Telehealth: Payer: Self-pay | Admitting: Podiatry

## 2024-12-04 NOTE — Telephone Encounter (Signed)
 Orthotics are in [place] office. They are schedule to [facility] on 12/10/24 11:45AM

## 2024-12-10 ENCOUNTER — Other Ambulatory Visit

## 2025-01-12 ENCOUNTER — Ambulatory Visit: Admitting: Physician Assistant
